# Patient Record
Sex: Female | Born: 1985 | Race: White | Hispanic: No | Marital: Married | Smoking: Former smoker
Health system: Southern US, Community
[De-identification: ages and names within clinical notes are randomized; demographics above are authoritative.]

## PROBLEM LIST (undated history)

## (undated) ENCOUNTER — Inpatient Hospital Stay (HOSPITAL_COMMUNITY): Payer: Self-pay

## (undated) VITALS — BP 145/92 | HR 83 | Temp 97.6°F | Resp 20

## (undated) DIAGNOSIS — I509 Heart failure, unspecified: Secondary | ICD-10-CM

## (undated) DIAGNOSIS — E669 Obesity, unspecified: Secondary | ICD-10-CM

## (undated) DIAGNOSIS — R011 Cardiac murmur, unspecified: Secondary | ICD-10-CM

## (undated) DIAGNOSIS — B009 Herpesviral infection, unspecified: Secondary | ICD-10-CM

## (undated) DIAGNOSIS — A6000 Herpesviral infection of urogenital system, unspecified: Secondary | ICD-10-CM

## (undated) DIAGNOSIS — IMO0002 Reserved for concepts with insufficient information to code with codable children: Secondary | ICD-10-CM

## (undated) DIAGNOSIS — R87619 Unspecified abnormal cytological findings in specimens from cervix uteri: Secondary | ICD-10-CM

## (undated) DIAGNOSIS — E119 Type 2 diabetes mellitus without complications: Secondary | ICD-10-CM

## (undated) DIAGNOSIS — I10 Essential (primary) hypertension: Secondary | ICD-10-CM

## (undated) DIAGNOSIS — O24419 Gestational diabetes mellitus in pregnancy, unspecified control: Secondary | ICD-10-CM

## (undated) DIAGNOSIS — Z9889 Other specified postprocedural states: Secondary | ICD-10-CM

## (undated) DIAGNOSIS — I341 Nonrheumatic mitral (valve) prolapse: Secondary | ICD-10-CM

## (undated) DIAGNOSIS — B999 Unspecified infectious disease: Secondary | ICD-10-CM

## (undated) DIAGNOSIS — J189 Pneumonia, unspecified organism: Secondary | ICD-10-CM

## (undated) DIAGNOSIS — E785 Hyperlipidemia, unspecified: Secondary | ICD-10-CM

## (undated) DIAGNOSIS — F419 Anxiety disorder, unspecified: Secondary | ICD-10-CM

## (undated) HISTORY — DX: Essential (primary) hypertension: I10

## (undated) HISTORY — DX: Obesity, unspecified: E66.9

## (undated) HISTORY — DX: Heart failure, unspecified: I50.9

## (undated) HISTORY — DX: Hyperlipidemia, unspecified: E78.5

## (undated) HISTORY — PX: CARDIAC SURGERY: SHX584

## (undated) HISTORY — PX: EXPLORATION POST OPERATIVE OPEN HEART: SHX5061

## (undated) HISTORY — DX: Other specified postprocedural states: Z98.890

## (undated) HISTORY — DX: Type 2 diabetes mellitus without complications: E11.9

---
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
## 2007-08-08 ENCOUNTER — Ambulatory Visit: Payer: Self-pay | Admitting: Internal Medicine

## 2008-06-04 ENCOUNTER — Encounter: Admission: RE | Admit: 2008-06-04 | Discharge: 2008-06-04 | Payer: Self-pay | Admitting: Internal Medicine

## 2010-02-10 LAB — HIV ANTIBODY (ROUTINE TESTING W REFLEX): HIV: NONREACTIVE

## 2010-02-10 LAB — ABO/RH: RH Type: POSITIVE

## 2010-02-21 ENCOUNTER — Emergency Department (HOSPITAL_COMMUNITY)
Admission: EM | Admit: 2010-02-21 | Discharge: 2010-02-21 | Payer: Self-pay | Source: Home / Self Care | Admitting: Emergency Medicine

## 2010-03-12 NOTE — L&D Delivery Note (Signed)
See progress notes

## 2010-05-20 ENCOUNTER — Inpatient Hospital Stay (HOSPITAL_COMMUNITY)
Admission: AD | Admit: 2010-05-20 | Discharge: 2010-05-20 | Disposition: A | Discharge status: Home / Self Care | Payer: Medicaid Other | Source: Ambulatory Visit | Attending: Obstetrics & Gynecology | Admitting: Obstetrics & Gynecology

## 2010-05-20 DIAGNOSIS — O99891 Other specified diseases and conditions complicating pregnancy: Secondary | ICD-10-CM | POA: Insufficient documentation

## 2010-05-20 DIAGNOSIS — O9989 Other specified diseases and conditions complicating pregnancy, childbirth and the puerperium: Secondary | ICD-10-CM

## 2010-05-20 DIAGNOSIS — R109 Unspecified abdominal pain: Secondary | ICD-10-CM | POA: Insufficient documentation

## 2010-05-20 LAB — CBC
MCH: 30 pg (ref 26.0–34.0)
MCHC: 34.3 g/dL (ref 30.0–36.0)
MCV: 87.5 fL (ref 78.0–100.0)
Platelets: 301 10*3/uL (ref 150–400)
RDW: 13.9 % (ref 11.5–15.5)
WBC: 12.8 10*3/uL — ABNORMAL HIGH (ref 4.0–10.5)

## 2010-05-20 LAB — URINALYSIS, ROUTINE W REFLEX MICROSCOPIC
Bilirubin Urine: NEGATIVE
Glucose, UA: 100 mg/dL — AB
Hgb urine dipstick: NEGATIVE
Ketones, ur: NEGATIVE mg/dL
Nitrite: NEGATIVE
Protein, ur: NEGATIVE mg/dL
Specific Gravity, Urine: 1.01 (ref 1.005–1.030)
Urobilinogen, UA: 0.2 mg/dL (ref 0.0–1.0)
pH: 7 (ref 5.0–8.0)

## 2010-05-23 LAB — DIFFERENTIAL
Basophils Absolute: 0.1 10*3/uL (ref 0.0–0.1)
Basophils Relative: 1 % (ref 0–1)
Eosinophils Relative: 4 % (ref 0–5)
Lymphocytes Relative: 22 % (ref 12–46)
Neutro Abs: 5.9 10*3/uL (ref 1.7–7.7)

## 2010-05-23 LAB — BASIC METABOLIC PANEL
BUN: 6 mg/dL (ref 6–23)
Creatinine, Ser: 0.46 mg/dL (ref 0.4–1.2)
GFR calc non Af Amer: >60 mL/min (ref >60)
Glucose, Bld: 101 mg/dL — ABNORMAL HIGH (ref 70–99)

## 2010-05-23 LAB — CBC
MCHC: 33.8 g/dL (ref 30.0–36.0)
Platelets: 334 10*3/uL (ref 150–400)
RDW: 13 % (ref 11.5–15.5)
WBC: 8.9 10*3/uL (ref 4.0–10.5)

## 2010-05-23 LAB — D-DIMER, QUANTITATIVE: D-Dimer, Quant: <0.22 ug/mL-FEU (ref 0.00–0.48)

## 2010-07-21 ENCOUNTER — Inpatient Hospital Stay (HOSPITAL_COMMUNITY)
Admission: AD | Admit: 2010-07-21 | Discharge: 2010-07-28 | DRG: 781 | Disposition: A | Discharge status: Home / Self Care | Payer: Medicaid Other | Source: Ambulatory Visit | Attending: Obstetrics and Gynecology | Admitting: Obstetrics and Gynecology

## 2010-07-21 DIAGNOSIS — O99891 Other specified diseases and conditions complicating pregnancy: Secondary | ICD-10-CM | POA: Diagnosis present

## 2010-07-21 DIAGNOSIS — R509 Fever, unspecified: Secondary | ICD-10-CM | POA: Diagnosis present

## 2010-07-21 DIAGNOSIS — R109 Unspecified abdominal pain: Secondary | ICD-10-CM | POA: Diagnosis present

## 2010-07-21 DIAGNOSIS — O98519 Other viral diseases complicating pregnancy, unspecified trimester: Principal | ICD-10-CM | POA: Diagnosis present

## 2010-07-21 DIAGNOSIS — D72829 Elevated white blood cell count, unspecified: Secondary | ICD-10-CM | POA: Diagnosis present

## 2010-07-21 DIAGNOSIS — A6000 Herpesviral infection of urogenital system, unspecified: Secondary | ICD-10-CM | POA: Diagnosis present

## 2010-07-22 ENCOUNTER — Inpatient Hospital Stay (HOSPITAL_COMMUNITY): Payer: Medicaid Other

## 2010-07-22 ENCOUNTER — Encounter (HOSPITAL_COMMUNITY): Payer: Self-pay | Admitting: Radiology

## 2010-07-22 DIAGNOSIS — R55 Syncope and collapse: Secondary | ICD-10-CM

## 2010-07-22 LAB — DIFFERENTIAL
Basophils Absolute: 0 10*3/uL (ref 0.0–0.1)
Lymphocytes Relative: 4 % — ABNORMAL LOW (ref 12–46)
Lymphs Abs: 0.8 10*3/uL (ref 0.7–4.0)
Monocytes Absolute: 3.5 10*3/uL — ABNORMAL HIGH (ref 0.1–1.0)
Neutro Abs: 16.3 10*3/uL — ABNORMAL HIGH (ref 1.7–7.7)

## 2010-07-22 LAB — URINE MICROSCOPIC-ADD ON

## 2010-07-22 LAB — COMPREHENSIVE METABOLIC PANEL
ALT: 9 U/L (ref 0–35)
BUN: 3 mg/dL — ABNORMAL LOW (ref 6–23)
CO2: 24 mEq/L (ref 19–32)
Calcium: 8.9 mg/dL (ref 8.4–10.5)
Creatinine, Ser: <0.47 mg/dL (ref 0.4–1.2)
Glucose, Bld: 148 mg/dL — ABNORMAL HIGH (ref 70–99)
Total Protein: 5.6 g/dL — ABNORMAL LOW (ref 6.0–8.3)

## 2010-07-22 LAB — CBC
HCT: 32.8 % — ABNORMAL LOW (ref 36.0–46.0)
HCT: 30 % — ABNORMAL LOW (ref 36.0–46.0)
Hemoglobin: 11.1 g/dL — ABNORMAL LOW (ref 12.0–15.0)
Hemoglobin: 11.3 g/dL — ABNORMAL LOW (ref 12.0–15.0)
MCH: 30.1 pg (ref 26.0–34.0)
MCH: 30.6 pg (ref 26.0–34.0)
MCHC: 33.8 g/dL (ref 30.0–36.0)
MCHC: 34.5 g/dL (ref 30.0–36.0)
MCV: 88.9 fL (ref 78.0–100.0)
MCV: 88 fL (ref 78.0–100.0)
Platelets: 259 10*3/uL (ref 150–400)
RBC: 3.41 MIL/uL — ABNORMAL LOW (ref 3.87–5.11)
RBC: 3.69 MIL/uL — ABNORMAL LOW (ref 3.87–5.11)
RDW: 14.2 % (ref 11.5–15.5)
RDW: 14.2 % (ref 11.5–15.5)
WBC: 21.3 10*3/uL — ABNORMAL HIGH (ref 4.0–10.5)

## 2010-07-22 LAB — URINALYSIS, ROUTINE W REFLEX MICROSCOPIC
Glucose, UA: 250 mg/dL — AB
Ketones, ur: NEGATIVE mg/dL
Nitrite: NEGATIVE
Protein, ur: NEGATIVE mg/dL

## 2010-07-22 MED ORDER — IOHEXOL 300 MG/ML  SOLN
100.0000 mL | Freq: Once | INTRAMUSCULAR | Status: AC | PRN
  Administered 2010-07-22: 100 mL via INTRAVENOUS

## 2010-07-23 LAB — CBC
HCT: 28.5 % — ABNORMAL LOW (ref 36.0–46.0)
MCH: 30.3 pg (ref 26.0–34.0)
MCV: 89.1 fL (ref 78.0–100.0)
RBC: 3.2 MIL/uL — ABNORMAL LOW (ref 3.87–5.11)
WBC: 16.8 10*3/uL — ABNORMAL HIGH (ref 4.0–10.5)

## 2010-07-23 LAB — DIFFERENTIAL
Basophils Relative: 0 % (ref 0–1)
Eosinophils Relative: 1 % (ref 0–5)
Lymphs Abs: 1.2 10*3/uL (ref 0.7–4.0)
Monocytes Relative: 17 % — ABNORMAL HIGH (ref 3–12)
Neutrophils Relative %: 75 % (ref 43–77)

## 2010-07-23 LAB — INFLUENZA PANEL BY PCR (TYPE A & B)
H1N1 flu by pcr: NOT DETECTED
Influenza A By PCR: NEGATIVE

## 2010-07-23 LAB — URINE CULTURE

## 2010-07-24 ENCOUNTER — Inpatient Hospital Stay (HOSPITAL_COMMUNITY): Payer: Medicaid Other

## 2010-07-24 LAB — CBC
HCT: 28.2 % — ABNORMAL LOW (ref 36.0–46.0)
Hemoglobin: 9.5 g/dL — ABNORMAL LOW (ref 12.0–15.0)
RBC: 3.17 MIL/uL — ABNORMAL LOW (ref 3.87–5.11)
WBC: 11.8 10*3/uL — ABNORMAL HIGH (ref 4.0–10.5)

## 2010-07-25 LAB — GLUCOSE, CAPILLARY: Glucose-Capillary: 84 mg/dL (ref 70–99)

## 2010-07-25 LAB — CBC
HCT: 27.2 % — ABNORMAL LOW (ref 36.0–46.0)
Hemoglobin: 9 g/dL — ABNORMAL LOW (ref 12.0–15.0)
MCH: 29.2 pg (ref 26.0–34.0)
MCHC: 33.1 g/dL (ref 30.0–36.0)
RBC: 3.08 MIL/uL — ABNORMAL LOW (ref 3.87–5.11)

## 2010-07-25 LAB — DIFFERENTIAL
Lymphocytes Relative: 10 % — ABNORMAL LOW (ref 12–46)
Lymphs Abs: 0.9 10*3/uL (ref 0.7–4.0)
Monocytes Absolute: 1.2 10*3/uL — ABNORMAL HIGH (ref 0.1–1.0)
Monocytes Relative: 13 % — ABNORMAL HIGH (ref 3–12)
Neutro Abs: 6.7 10*3/uL (ref 1.7–7.7)
Neutrophils Relative %: 75 % (ref 43–77)

## 2010-07-25 LAB — HEMOGLOBIN A1C: Mean Plasma Glucose: 108 mg/dL (ref <117)

## 2010-07-26 LAB — DIFFERENTIAL
Eosinophils Absolute: 0.1 10*3/uL (ref 0.0–0.7)
Eosinophils Relative: 1 % (ref 0–5)
Lymphs Abs: 1.1 10*3/uL (ref 0.7–4.0)
Monocytes Relative: 13 % — ABNORMAL HIGH (ref 3–12)

## 2010-07-26 LAB — URINE CULTURE
Culture  Setup Time: 201205160124
Special Requests: NEGATIVE

## 2010-07-26 LAB — CBC
MCH: 29.5 pg (ref 26.0–34.0)
MCHC: 33.5 g/dL (ref 30.0–36.0)
MCV: 88.3 fL (ref 78.0–100.0)
Platelets: 206 10*3/uL (ref 150–400)
RDW: 14.3 % (ref 11.5–15.5)

## 2010-07-26 LAB — COMPREHENSIVE METABOLIC PANEL
Albumin: 1.8 g/dL — ABNORMAL LOW (ref 3.5–5.2)
BUN: <3 mg/dL — ABNORMAL LOW (ref 6–23)
Creatinine, Ser: <0.47 mg/dL (ref 0.4–1.2)
Total Protein: 4.5 g/dL — ABNORMAL LOW (ref 6.0–8.3)

## 2010-07-26 LAB — URINALYSIS, MICROSCOPIC ONLY
Glucose, UA: NEGATIVE mg/dL
Nitrite: NEGATIVE
Protein, ur: NEGATIVE mg/dL
pH: 7 (ref 5.0–8.0)

## 2010-07-26 LAB — STREP B DNA PROBE

## 2010-07-27 LAB — BASIC METABOLIC PANEL
CO2: 22 mEq/L (ref 19–32)
Chloride: 106 mEq/L (ref 96–112)
Glucose, Bld: 97 mg/dL (ref 70–99)
Potassium: 4.1 mEq/L (ref 3.5–5.1)
Sodium: 137 mEq/L (ref 135–145)

## 2010-07-27 LAB — CMV IGM: CMV IgM: 0.06 (ref <0.90)

## 2010-07-27 LAB — CBC
HCT: 31.4 % — ABNORMAL LOW (ref 36.0–46.0)
Hemoglobin: 10.2 g/dL — ABNORMAL LOW (ref 12.0–15.0)
MCHC: 32.5 g/dL (ref 30.0–36.0)
RBC: 3.49 MIL/uL — ABNORMAL LOW (ref 3.87–5.11)
WBC: 7.6 10*3/uL (ref 4.0–10.5)

## 2010-07-27 LAB — CMV ANTIBODY, IGG (EIA): CMV Ab - IgG: 0.24 (ref <0.90)

## 2010-07-28 LAB — PARVOVIRUS B19 ANTIBODY, IGG AND IGM
Parovirus B19 IgG Abs: 4.1 Index — ABNORMAL HIGH (ref <0.9)
Parovirus B19 IgM Abs: <0.9 Index (ref <0.9)

## 2010-07-28 LAB — HERPES SIMPLEX VIRUS CULTURE

## 2010-07-28 LAB — HSV(HERPES SMPLX)ABS-I+II(IGG+IGM)-BLD
Herpes Simplex Vrs I + II Ab, IgG: 2.2 IV — ABNORMAL HIGH
Herpes Simplex Vrs I&II-IgM Ab (EIA): 0.93 INDEX

## 2010-07-28 NOTE — Assessment & Plan Note (Signed)
[facility]                            CARDIOLOGY OFFICE NOTE   [name], [name]                MRN:          [id number]  DATE:08/13/2007                            DOB:          06/10/1985    IDENTIFICATION:  [name] is a 25 year old woman who was previously  followed by Dr. Lorna Few.  She comes in for continued care.   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  She also has a history of small ASD.  Also a history  of reported mitral valve prolapse.  The patient was last seen in  December of 2005 and an echocardiogram was done that showed normal LV  function.  There is mild flattening of the interventricular septum  during diastole.  The left atrium appeared to be normal in size.  Mitral  valve had no evidence for prolapse.  Right atrium was normal.  Right  ventricle was noted to be mildly dilated at 29 mm (upper limits of 26 at  her age).  PA pressure, though, was normal at 22 mm.  RV function was  normal.  There was no mention of any residual ASD.   ALLERGIES:  None.   MEDICATIONS:  Include over-the-counter allergy medicine.   PAST MEDICAL HISTORY:  As noted above plus seasonal allergies.   SOCIAL HISTORY:  The patient as a Consulting civil engineer.  She smokes a pack every  couple weeks.  Drinks occasionally.   FAMILY HISTORY:  Not obtained   REVIEW OF SYSTEMS:  All systems are reviewed and negative to the above  problem except as noted above.   PHYSICAL EXAM:  On exam the patient is in no distress.  Blood pressure is 121/83, pulse is 90 and regular, weight not taken.  HEENT:  Normocephalic atraumatic, EOMI, PERL.  Mucous membranes are  moist.  NECK:  JVP is normal.  No bruits.  No thyromegaly.  Lungs are clear without rales.  Cardiac exam, S1-S2 no S3-S4, murmurs or clicks noted.  No RV heave.  Abdomen is supple, no hepatomegaly.  Normal bowel sounds.  No masses.  EXTREMITIES:  Good  distal pulses throughout.  No lower extremity edema.  Note, tattoos noted.   A 12-lead EKG shows normal sinus rhythm 90 beats per minute.  Nonspecific ST-T wave changes.   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  Echocardiogram without residual defects 4 years ago.  Examination unremarkable today.  She also had a question of history of  mitral valve prolapse.  Again not seen on echo.  I do not hear any  evidence for a today.  I do not sense any evidence for any residual  shunt.  I do not have the records from Dr. Lorna Few.  Again the  echo does not report this.   Overall I think she has done well.  She is now fully grown.  I do not  suspect that she should have any further problems and I do not think  that she needs to be seen on a regular basis, only as needed  for now.   I have counseled her about health care maintenance, counseled her about  smoking.  I think she should stop.  I have counseled her on exercise to  continue to remain active.  She should have her lipids checked in the  next few years and then every 3 to 5 years after if they are normal.  If  not normal, sooner.   RECOMMENDATIONS:  In review of the recommendations by the American Heart  Association, the patient does not have any of the criteria for continued  antibiotic prophylaxis.  Again, I will review her surgical record.  I  have counseled her on piercing, though I do not think further piercing,  she has her ears pierced, would be good for her.  Again, she should be  careful with tattoos for sterility during placement.   I will be available as needed.     Pricilla Riffle, MD, [facility]  Electronically Signed    PVR/MedQ  DD: 08/13/2007  DT: 08/13/2007  Job #: [id number]

## 2010-07-29 NOTE — Discharge Summary (Signed)
  NAMEPAULLETTE, [name]           ACCOUNT NO.:  [id number]  MEDICAL RECORD NO.:  [id number]           PATIENT TYPE:  I  LOCATION:  9155                          FACILITY:  [facility]  PHYSICIAN:  Gerrit Friends. Aldona Bar, M.D.   DATE OF BIRTH:  1985-08-23  DATE OF ADMISSION:  07/21/2010 DATE OF DISCHARGE:  07/28/2010                              DISCHARGE SUMMARY   DISCHARGE DIAGNOSES: 1. A 33-week intrauterine pregnancy, undelivered. 2. 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 3. Fever, abdominal pain, leukocytosis, secondary to primary herpes     simplex infection.  SUMMARY:  This patient, a 25 year old primigravida was admitted by Dr. Dareen Piano on May 11 at 31-32 weeks' gestation with a fever, lower abdominal pain, leukocytosis, and underwent a very extensive workup which included a general surgical consult to rule out appendicitis.  An abdominal and pelvic CT scan, renal ultrasound and cultures for Chlamydia, gonorrhea, group B strep all of which were negative.  Blood cultures which are still pending, but at this point being read as negative.  A monospot test which was negative.  Hemoglobin A1c which was normal.  Cytomegalovirus which was negative.  Parvovirus testing which is pending.  Herpes cultures which are still pending.  The diagnosis was in question until approximately 2 days ago, went on rounds, she broke out with multiple ulcerated areas which was very tender in the vulvar area - very consistent with a primary herpes infection considering her symptoms.  She was begun on Valtrex and began having fairly significant improvement both in her fever and her general state.  All during this episode, fetal monitoring was within normal limits.  After being started on Valtrex and having a hypokalemia corrected with potassium, she began improving and at this point in time, is feeling much better and essentially has been afebrile for the past 24+ hours.  She currently  is continuing her Valtrex and using lidocaine 5% gel for comfort and notices of tremendous improvement.  Although, her herpes cultures and her parvovirus tests and her blood cultures are still not completely available.  It is felt that her clinical improvement justifies discharging her to home with followup in the office in approximately 4-5 days' time or as needed.  She was instructed on a lidocaine gel use.  She was given a prescription for Valtrex 500 mg to use twice daily for another 5 days and thereafter daily for the duration of pregnancy considering her culture will probably return as positive.  She will continue on her prenatal vitamins.  She was on Rocephin, which was discontinued on the evening of 17th.  Her most recent hemoglobin was done yesterday, May 17 and was 10.2 with a white count of 7600 and a platelet count of 228,000.  She was given all appropriate structures at the time of discharge and as mentioned, she will follow up in the office in approximately 4-5 days' time.  CONDITION ON DISCHARGE:  Improved.  Gerrit Friends. Aldona Bar, M.D.     RMW/MEDQ  D:  07/28/2010  T:  07/28/2010  Job:  [id number]  Electronically Signed by Annamaria Helling M.D. on 07/29/2010 03:05:06 PM

## 2010-07-30 LAB — CULTURE, BLOOD (ROUTINE X 2)
Culture  Setup Time: 201205141017
Culture  Setup Time: 201205141017

## 2010-08-03 NOTE — Consult Note (Signed)
NAME:  [name], [name]           ACCOUNT NO.:  [id number]  MEDICAL RECORD NO.:  [id number]           PATIENT TYPE:  LOCATION:                                 FACILITY:  PHYSICIAN:  Sandria Bales. Ezzard Standing, M.D.  DATE OF BIRTH:  23-Feb-1986  DATE OF CONSULTATION: 22 Jul 2010                                CONSULTATION   REASON OR CONSULTATION:  Possible acute abdomen.  REFERRING PHYSICIAN:  Malva Limes, MD  HISTORY OF PRESENT ILLNESS:  [name] is a 25 year old white female who is a patient of Dr. Malva Limes who is admitted with a 31-week pregnancy.  This is her first pregnancy.  She has had a fairly uneventful pregnancy until maybe Wednesday of this week which would be Jul 19, 2010, when she started feeling poorly, complained of some vague discomfort.  By Friday, she had some chills, nausea, and vomiting and was admitted to the [facility] unit.  She denies any history of peptic ulcer disease, liver disease, pancreas disease, or colon disease.  She has had no prior abdominal surgery.  She did just completed a CT scan of her abdomen.  I discussed this by phone with Dr. Christiana Pellant.  The appendix can be seen and is normal. There is no acute inflammatory mass.  She does have a stone of the inferior pole of the left kidney.  PAST MEDICAL HISTORY:  As she is right now.  I think this she is on neonatal vitamins, but no other medicines.  REVIEW OF SYSTEMS:   NEUROLOGIC:  She has never had any seizure or loss of consciousness.  She just had some vague headaches when this first started, which raised the question of some flu-like symptoms and she has been tested for this and isolation because of rule out flu.   CARDIAC: At 9 months, she had an open repair of a cor triatriatum and apparently had a small ASD.  She was originally followed by Dr. Lorna Few, then followed by Dr. Dietrich Pates.  The last visit with Dr. Tenny Craw was June 2009 and she was doing well at that time.  She  apparently had a repeat echo yesterday.  Even though I cannot find a report, though discussion is this was a normal echo both no cardiac source for fever.   PULMONARY: She smoked about a pack of cigarettes up until the beginning of her pregnancy.  She says she quit since the beginning of the pregnancy.  She has no cough or pulmonary symptoms.   GASTROINTESTINAL:  See history of present illness.   UROLOGIC:  She has never noted a kidney stones or kidney infections before.   MUSCULOSKELETAL:  She broke her right arm and second and third ribs, but has no chronic musculoskeletal problems.  PERSONAL HISTORY:  I guess it is her husband who is in the room with her.  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  PHYSICAL EXAMINATION:  VITAL SIGNS:  At the current time, her temperature is 99, pulse 105, blood pressure 114/62.  Her temperature earlier today was 101.4. HEENT:  Unremarkable. NECK:  Supple.  I feel  no mass.  No thyromegaly.  She has no cervical or supraclavicular adenopathy. LUNGS:  Clear to auscultation with symmetric breath sounds.  She has a well-healed median sternotomy scar. HEART:  Regular rate and rhythm. BREAST:  I did not examine her breast. ABDOMEN:  A pregnant woman.  She has a tattoo of her lower abdomen, but she has no guarding, no rebound.  She has decreased but present bowel sounds. RECTAL:  I did not do a rectal exam on her. EXTREMITIES:  She has good strength in upper and lower extremities. NEUROLOGIC:  Grossly intact.  LABORATORY DATA:  Data have show again a white blood count is 21,300, hemoglobin 10.4, hematocrit 30, and platelet count 218,000.  Her differential was fairly normal with 79% neutrophils.   Her complete metabolic panel shows a sodium of 134, potassium 3.2, chloride of 99, CO2 of 24, glucose of 148, BUN 3, creatinine less than 0.47.  Her albumin is 2.4.  Her alk phos is mildly elevated at 128.  Her other  liver enzymes are normal.  Her urinalysis did show 11-20 white blood cells.  IMPRESSION: 1. Acute febrile episode with nausea and vomiting.  Etiology unclear,     though there is no obvious acute surgical cause of this. 2. Left inferior pole kidney stone which seems unrelated to the acute     infection.  Assymptomatic. 3. Leukocytosis, on Rocephin 4. History of cardiac surgery at 9 months for a triatriatum, but     stable echo yesterday 5. She has smoked until the beginning of her current pregnancy. 6. She is 31 weeks' pregnant with what appears to be a healthy infant     by fetal heart sounds.  At this current time, the patient is on IV fluids, on IV antibiotics, being observed and agree with this management and will follow with Dr. Dareen Piano.   Sandria Bales. Ezzard Standing, M.D., FACS   DHN/MEDQ  D:  07/22/2010  T:  07/23/2010  Job:  [id number]  Electronically Signed by Ovidio Kin M.D. on 08/03/2010 09:29:49 AM

## 2010-09-11 ENCOUNTER — Ambulatory Visit (HOSPITAL_COMMUNITY)
Admission: RE | Admit: 2010-09-11 | Discharge: 2010-09-11 | Disposition: A | Discharge status: Home / Self Care | Payer: Medicaid Other | Source: Ambulatory Visit | Attending: Obstetrics and Gynecology | Admitting: Obstetrics and Gynecology

## 2010-09-11 ENCOUNTER — Other Ambulatory Visit (HOSPITAL_COMMUNITY): Payer: Self-pay | Admitting: Obstetrics and Gynecology

## 2010-09-11 DIAGNOSIS — O4190X0 Disorder of amniotic fluid and membranes, unspecified, unspecified trimester, not applicable or unspecified: Secondary | ICD-10-CM

## 2010-09-11 DIAGNOSIS — R58 Hemorrhage, not elsewhere classified: Secondary | ICD-10-CM

## 2010-09-11 DIAGNOSIS — O4690 Antepartum hemorrhage, unspecified, unspecified trimester: Secondary | ICD-10-CM | POA: Insufficient documentation

## 2010-09-11 DIAGNOSIS — Z3689 Encounter for other specified antenatal screening: Secondary | ICD-10-CM | POA: Insufficient documentation

## 2010-09-15 ENCOUNTER — Inpatient Hospital Stay (HOSPITAL_COMMUNITY)
Admission: AD | Admit: 2010-09-15 | Discharge: 2010-09-15 | Disposition: A | Discharge status: Home / Self Care | Payer: Medicaid Other | Source: Ambulatory Visit | Attending: Obstetrics and Gynecology | Admitting: Obstetrics and Gynecology

## 2010-09-15 DIAGNOSIS — O479 False labor, unspecified: Secondary | ICD-10-CM | POA: Insufficient documentation

## 2010-09-15 DIAGNOSIS — K2970 Gastritis, unspecified, without bleeding: Secondary | ICD-10-CM

## 2010-09-15 DIAGNOSIS — K2990 Gastroduodenitis, unspecified, without bleeding: Secondary | ICD-10-CM

## 2010-09-15 DIAGNOSIS — O9989 Other specified diseases and conditions complicating pregnancy, childbirth and the puerperium: Secondary | ICD-10-CM

## 2010-09-15 DIAGNOSIS — O99891 Other specified diseases and conditions complicating pregnancy: Secondary | ICD-10-CM | POA: Insufficient documentation

## 2010-09-15 LAB — URINALYSIS, ROUTINE W REFLEX MICROSCOPIC
Glucose, UA: NEGATIVE mg/dL
Hgb urine dipstick: NEGATIVE
Specific Gravity, Urine: 1.01 (ref 1.005–1.030)

## 2010-09-18 LAB — URINE CULTURE: Colony Count: 55000

## 2010-09-21 ENCOUNTER — Encounter (HOSPITAL_COMMUNITY): Payer: Self-pay | Admitting: *Deleted

## 2010-09-21 ENCOUNTER — Inpatient Hospital Stay (HOSPITAL_COMMUNITY)
Admission: AD | Admit: 2010-09-21 | Discharge: 2010-09-22 | Disposition: A | Discharge status: Home / Self Care | Payer: Medicaid Other | Source: Ambulatory Visit | Attending: Obstetrics and Gynecology | Admitting: Obstetrics and Gynecology

## 2010-09-21 DIAGNOSIS — O479 False labor, unspecified: Secondary | ICD-10-CM | POA: Insufficient documentation

## 2010-09-21 NOTE — Progress Notes (Signed)
Pt reports she was seen in the office today. SVE  3 cm. Contractions are now q 5-7 minutes. States she has been bleeding like a period x 8 hours (used 3 pads )

## 2010-09-22 ENCOUNTER — Inpatient Hospital Stay (HOSPITAL_COMMUNITY)
Admission: AD | Admit: 2010-09-22 | Discharge: 2010-09-24 | DRG: 775 | Disposition: A | Discharge status: Home / Self Care | Payer: Medicaid Other | Source: Ambulatory Visit | Attending: Obstetrics and Gynecology | Admitting: Obstetrics and Gynecology

## 2010-09-22 ENCOUNTER — Encounter (HOSPITAL_COMMUNITY): Payer: Self-pay | Admitting: *Deleted

## 2010-09-22 ENCOUNTER — Inpatient Hospital Stay (HOSPITAL_COMMUNITY): Payer: Medicaid Other | Admitting: Anesthesiology

## 2010-09-22 ENCOUNTER — Encounter (HOSPITAL_COMMUNITY): Payer: Self-pay | Admitting: Anesthesiology

## 2010-09-22 ENCOUNTER — Encounter (HOSPITAL_COMMUNITY): Payer: Self-pay

## 2010-09-22 DIAGNOSIS — Z789 Other specified health status: Secondary | ICD-10-CM | POA: Insufficient documentation

## 2010-09-22 HISTORY — DX: Herpesviral infection of urogenital system, unspecified: A60.00

## 2010-09-22 HISTORY — DX: Herpesviral infection, unspecified: B00.9

## 2010-09-22 LAB — COMPREHENSIVE METABOLIC PANEL
Albumin: 3 g/dL — ABNORMAL LOW (ref 3.5–5.2)
Alkaline Phosphatase: 232 U/L — ABNORMAL HIGH (ref 39–117)
BUN: 9 mg/dL (ref 6–23)
CO2: 18 mEq/L — ABNORMAL LOW (ref 19–32)
Chloride: 98 mEq/L (ref 96–112)
Glucose, Bld: 117 mg/dL — ABNORMAL HIGH (ref 70–99)
Potassium: 3.7 mEq/L (ref 3.5–5.1)
Total Bilirubin: 0.6 mg/dL (ref 0.3–1.2)

## 2010-09-22 LAB — CBC
Hemoglobin: 13.4 g/dL (ref 12.0–15.0)
MCH: 30.9 pg (ref 26.0–34.0)
MCHC: 34.7 g/dL (ref 30.0–36.0)
MCV: 89.1 fL (ref 78.0–100.0)

## 2010-09-22 LAB — RPR: RPR Ser Ql: NONREACTIVE

## 2010-09-22 LAB — LACTATE DEHYDROGENASE: LDH: 215 U/L (ref 94–250)

## 2010-09-22 MED ORDER — LACTATED RINGERS IV SOLN
INTRAVENOUS | Status: DC
  Administered 2010-09-22 (×2): via INTRAVENOUS

## 2010-09-22 MED ORDER — ZOLPIDEM TARTRATE 10 MG PO TABS: 10.0000 mg | ORAL_TABLET | Freq: Every evening | ORAL | Status: DC | PRN

## 2010-09-22 MED ORDER — CITRIC ACID-SODIUM CITRATE 334-500 MG/5ML PO SOLN: 30.0000 mL | ORAL | Status: DC | PRN

## 2010-09-22 MED ORDER — AMPICILLIN SODIUM 2 G IJ SOLR
2.0000 g | Freq: Four times a day (QID) | INTRAMUSCULAR | Status: DC
  Filled 2010-09-22 (×3): qty 2000

## 2010-09-22 MED ORDER — LIDOCAINE HCL (PF) 1 % IJ SOLN
30.0000 mL | Freq: Once | INTRAMUSCULAR | Status: AC | PRN
  Filled 2010-09-22: qty 30

## 2010-09-22 MED ORDER — ONDANSETRON HCL 4 MG/2ML IJ SOLN
4.0000 mg | Freq: Four times a day (QID) | INTRAMUSCULAR | Status: DC | PRN
  Administered 2010-09-22: 4 mg via INTRAVENOUS
  Filled 2010-09-22: qty 2

## 2010-09-22 MED ORDER — DIPHENHYDRAMINE HCL 50 MG/ML IJ SOLN: 12.5000 mg | INTRAMUSCULAR | Status: DC | PRN

## 2010-09-22 MED ORDER — OXYCODONE-ACETAMINOPHEN 5-325 MG PO TABS
2.0000 | ORAL_TABLET | ORAL | Status: DC | PRN
  Filled 2010-09-22: qty 1

## 2010-09-22 MED ORDER — ACETAMINOPHEN 325 MG PO TABS: 650.0000 mg | ORAL_TABLET | ORAL | Status: DC | PRN

## 2010-09-22 MED ORDER — OXYTOCIN 20 UNITS IN LACTATED RINGERS INFUSION - SIMPLE: 1.0000 m[IU]/min | INTRAVENOUS | Status: DC

## 2010-09-22 MED ORDER — PHENYLEPHRINE 40 MCG/ML (10ML) SYRINGE FOR IV PUSH (FOR BLOOD PRESSURE SUPPORT)
80.0000 ug | PREFILLED_SYRINGE | INTRAVENOUS | Status: DC | PRN
  Filled 2010-09-22: qty 5

## 2010-09-22 MED ORDER — TERBUTALINE SULFATE 1 MG/ML IJ SOLN: 0.2500 mg | Freq: Once | INTRAMUSCULAR | Status: AC | PRN

## 2010-09-22 MED ORDER — LACTATED RINGERS IV SOLN
500.0000 mL | INTRAVENOUS | Status: AC | PRN
  Administered 2010-09-23: 1000 mL via INTRAVENOUS

## 2010-09-22 MED ORDER — OXYTOCIN 20 UNITS IN LACTATED RINGERS INFUSION - SIMPLE
INTRAVENOUS | Status: AC
  Filled 2010-09-22: qty 1000

## 2010-09-22 MED ORDER — EPHEDRINE 5 MG/ML INJ
10.0000 mg | INTRAVENOUS | Status: DC | PRN
  Filled 2010-09-22 (×2): qty 4

## 2010-09-22 MED ORDER — EPHEDRINE 5 MG/ML INJ
10.0000 mg | INTRAVENOUS | Status: DC | PRN
  Filled 2010-09-22: qty 4

## 2010-09-22 MED ORDER — SODIUM CHLORIDE 0.9 % IV SOLN
2.0000 g | Freq: Four times a day (QID) | INTRAVENOUS | Status: DC
  Administered 2010-09-22: 2 g via INTRAVENOUS
  Filled 2010-09-22 (×4): qty 2000

## 2010-09-22 MED ORDER — FENTANYL 2.5 MCG/ML BUPIVACAINE 1/10 % EPIDURAL INFUSION (WH - ANES)
2.0000 mL/h | INTRAMUSCULAR | Status: DC
  Administered 2010-09-22 (×4): 12 mL/h via EPIDURAL
  Filled 2010-09-22 (×4): qty 60

## 2010-09-22 MED ORDER — LACTATED RINGERS IV SOLN
500.0000 mL | Freq: Once | INTRAVENOUS | Status: AC
  Administered 2010-09-22: 1000 mL via INTRAVENOUS

## 2010-09-22 MED ORDER — IBUPROFEN 600 MG PO TABS
600.0000 mg | ORAL_TABLET | Freq: Four times a day (QID) | ORAL | Status: DC | PRN
  Administered 2010-09-23: 600 mg via ORAL
  Filled 2010-09-22: qty 1

## 2010-09-22 MED ORDER — LIDOCAINE HCL 1.5 % IJ SOLN
INTRAMUSCULAR | Status: DC | PRN
  Administered 2010-09-22: 4 mL
  Administered 2010-09-22: 3 mL via INTRADERMAL

## 2010-09-22 MED ORDER — FLEET ENEMA 7-19 GM/118ML RE ENEM: 1.0000 | ENEMA | RECTAL | Status: DC | PRN

## 2010-09-22 NOTE — Progress Notes (Signed)
Ctx's q54minutes since early this am, bloody show, denies lof, +FM.

## 2010-09-22 NOTE — H&P (Addendum)
Patient comes in c/o labor.  Otherwise has good fetal movement and no bleeding. No sx pre-eclampsia.  Past Medical History  Diagnosis Date  . Herpes genitalia   . No pertinent past medical history   . HSV (herpes simplex virus) infection     last outbreak in may    Past Surgical History  Procedure Date  . Cardiac surgery     congenital heart defect repair at 9 mos    OB History    Grav Para Term Preterm Abortions TAB SAB Ect Mult Living   1  0 0 0 0 0 0 0 0     # Outc Date GA Lbr Len/2nd Wgt Sex Del Anes PTL Lv   1 CUR               History   Social History  . Marital Status: Married    Spouse Name: N/A    Number of Children: N/A  . Years of Education: N/A   Occupational History  . Not on file.   Social History Main Topics  . Smoking status: Not on file  . Smokeless tobacco: Not on file  . Alcohol Use:   . Drug Use:   . Sexually Active: Yes   Other Topics Concern  . Not on file   Social History Narrative  . No narrative on file   Review of patient's allergies indicates no known allergies.   Prenatal Course:  Fetal echo done because of maternal history of cardiac malformation- normal echo.  Filed Vitals:   09/22/10 1121  BP: 142/72  Pulse: 89  Temp:   Resp:      Lungs/Cor:  NAD Abdomen:  soft, gravid Ex:  no cords, erythema SVE:  6/80/-2 FHTs:  140s, good STV, NST R Toco:  q3 SSE negative for lesions.  AROM clear.   A/P   Admit for labor.  BPs elevated.  GBS neg.  PIH labs.    Myrtle Haller A

## 2010-09-22 NOTE — Anesthesia Procedure Notes (Addendum)
Epidural Patient location during procedure: OB Start time: 09/22/2010 11:09 AM  Preanesthetic Checklist Completed: patient identified, site marked, surgical consent, pre-op evaluation, timeout performed, IV checked, risks and benefits discussed and monitors and equipment checked  Epidural Patient position: sitting Prep: site prepped and draped and DuraPrep Patient monitoring: continuous pulse ox and blood pressure Approach: midline Injection technique: LOR air  Needle:  Needle type: Tuohy  Needle gauge: 17 G Needle length: 9 cm Needle insertion depth: 4 cm Catheter type: closed end flexible Catheter size: 19 Gauge Catheter at skin depth: 10 and 9 cm Test dose: negative and 1.5% lidocaine  Assessment Events: blood not aspirated, injection not painful, no injection resistance, negative IV test and no paresthesia  Epidural Patient location during procedure: OB Start time: 09/22/2010 11:10 AM  Staffing Anesthesiologist: Levar Fayson A. Performed by: anesthesiologist   Preanesthetic Checklist Completed: patient identified, site marked, surgical consent, pre-op evaluation, timeout performed, IV checked, risks and benefits discussed and monitors and equipment checked  Epidural Patient position: sitting Prep: site prepped and draped and DuraPrep Patient monitoring: continuous pulse ox and blood pressure Approach: midline Injection technique: LOR air  Needle:  Needle type: Tuohy  Needle gauge: 17 G Needle length: 9 cm Needle insertion depth: 4 cm Catheter type: closed end flexible Catheter size: 19 Gauge Catheter at skin depth: 9 cm Test dose: negative and 1.5% lidocaine  Additional Notes Patient is more comfortable after epidural dosed. Please see RN's note for documentation of vital signs and FHR which are stable.

## 2010-09-22 NOTE — Anesthesia Preprocedure Evaluation (Signed)
Anesthesia Evaluation  Name, MR# and DOB Patient awake and Patient confused  General Assessment Comment  Reviewed: Allergy & Precautions, H&P  and Patient's Chart, lab work & pertinent test results  Airway Mallampati: III TM Distance: >3 FB Neck ROM: full    Dental No notable dental hx (+) Teeth Intact   Pulmonaryneg pulmonary ROS      pulmonary exam normal   Cardiovascular regular Normal   Neuro/Psych  GI/Hepatic/Renal negative GI ROS, negative Liver ROS, and negative Renal ROS (+)       Endo/Other  Negative Endocrine ROS (+)   Abdominal   Musculoskeletal  Hematology negative hematology ROS (+)   Peds  Reproductive/Obstetrics negative OB ROS   Anesthesia Other Findings             Anesthesia Physical Anesthesia Plan  ASA: II  Anesthesia Plan: Epidural   Post-op Pain Management:    Induction:   Airway Management Planned:   Additional Equipment:   Intra-op Plan:   Post-operative Plan:   Informed Consent: I have reviewed the patients History and Physical, chart, labs and discussed the procedure including the risks, benefits and alternatives for the proposed anesthesia with the patient or authorized representative who has indicated his/her understanding and acceptance.     Plan Discussed with: Anesthesiologist  Anesthesia Plan Comments:         Anesthesia Quick Evaluation

## 2010-09-22 NOTE — Progress Notes (Signed)
Pt in for labor eval- states she has been having ucs all day- worse since 1800, have been about 7-10 minutes apart.  Reports small amount of bleeding throughout day, increased around 1530.  + FM.  Denies any leaking of fluid.  3 cm earlier in office.

## 2010-09-22 NOTE — Progress Notes (Signed)
Here last night, contractions are closer and stronger. Intact, small amt of bloody mucous.

## 2010-09-23 LAB — CBC
HCT: 33.5 % — ABNORMAL LOW (ref 36.0–46.0)
Hemoglobin: 11.4 g/dL — ABNORMAL LOW (ref 12.0–15.0)
MCH: 30.7 pg (ref 26.0–34.0)
MCV: 90.3 fL (ref 78.0–100.0)
Platelets: 236 10*3/uL (ref 150–400)
RBC: 3.71 MIL/uL — ABNORMAL LOW (ref 3.87–5.11)
WBC: 26.4 10*3/uL — ABNORMAL HIGH (ref 4.0–10.5)

## 2010-09-23 MED ORDER — MEASLES, MUMPS & RUBELLA VAC ~~LOC~~ INJ
0.5000 mL | INJECTION | Freq: Once | SUBCUTANEOUS | Status: AC
  Administered 2010-09-24: 0.5 mL via SUBCUTANEOUS
  Filled 2010-09-23 (×2): qty 0.5

## 2010-09-23 MED ORDER — FERROUS SULFATE 325 (65 FE) MG PO TABS
325.0000 mg | ORAL_TABLET | Freq: Two times a day (BID) | ORAL | Status: DC
  Administered 2010-09-23 – 2010-09-24 (×3): 325 mg via ORAL
  Filled 2010-09-23 (×3): qty 1

## 2010-09-23 MED ORDER — PRENATAL PLUS 27-1 MG PO TABS
1.0000 | ORAL_TABLET | Freq: Every day | ORAL | Status: DC
  Administered 2010-09-23 – 2010-09-24 (×2): 1 via ORAL
  Filled 2010-09-23 (×3): qty 1

## 2010-09-23 MED ORDER — MAGNESIUM HYDROXIDE 400 MG/5ML PO SUSP: 30.0000 mL | ORAL | Status: DC | PRN

## 2010-09-23 MED ORDER — RHO D IMMUNE GLOBULIN 1500 UNIT/2ML IJ SOLN: 300.0000 ug | Freq: Once | INTRAMUSCULAR | Status: DC

## 2010-09-23 MED ORDER — BENZOCAINE-MENTHOL 20-0.5 % EX AERO: 1.0000 "application " | INHALATION_SPRAY | CUTANEOUS | Status: DC | PRN

## 2010-09-23 MED ORDER — ONDANSETRON HCL 4 MG/2ML IJ SOLN: 4.0000 mg | INTRAMUSCULAR | Status: DC | PRN

## 2010-09-23 MED ORDER — BENZOCAINE-MENTHOL 20-0.5 % EX AERO
INHALATION_SPRAY | CUTANEOUS | Status: AC
  Filled 2010-09-23: qty 56

## 2010-09-23 MED ORDER — ONDANSETRON HCL 4 MG PO TABS: 4.0000 mg | ORAL_TABLET | ORAL | Status: DC | PRN

## 2010-09-23 MED ORDER — TETANUS-DIPHTH-ACELL PERTUSSIS 5-2.5-18.5 LF-MCG/0.5 IM SUSP
0.5000 mL | Freq: Once | INTRAMUSCULAR | Status: AC
  Administered 2010-09-24: 0.5 mL via INTRAMUSCULAR
  Filled 2010-09-23: qty 0.5

## 2010-09-23 MED ORDER — LANOLIN HYDROUS EX OINT: TOPICAL_OINTMENT | CUTANEOUS | Status: DC | PRN

## 2010-09-23 MED ORDER — SENNOSIDES-DOCUSATE SODIUM 8.6-50 MG PO TABS
1.0000 | ORAL_TABLET | Freq: Every day | ORAL | Status: DC
  Administered 2010-09-23: 2 via ORAL

## 2010-09-23 MED ORDER — DIPHENHYDRAMINE HCL 25 MG PO CAPS: 25.0000 mg | ORAL_CAPSULE | Freq: Four times a day (QID) | ORAL | Status: DC | PRN

## 2010-09-23 MED ORDER — SIMETHICONE 80 MG PO CHEW: 80.0000 mg | CHEWABLE_TABLET | ORAL | Status: DC | PRN

## 2010-09-23 MED ORDER — NITROFURANTOIN MACROCRYSTAL 50 MG PO CAPS
50.0000 mg | ORAL_CAPSULE | Freq: Every day | ORAL | Status: DC
  Administered 2010-09-23: 50 mg via ORAL
  Filled 2010-09-23 (×2): qty 1

## 2010-09-23 MED ORDER — OXYCODONE-ACETAMINOPHEN 5-325 MG PO TABS
1.0000 | ORAL_TABLET | ORAL | Status: DC | PRN
  Administered 2010-09-23: 1 via ORAL
  Administered 2010-09-23 – 2010-09-24 (×3): 2 via ORAL
  Filled 2010-09-23 (×3): qty 2

## 2010-09-23 MED ORDER — ZOLPIDEM TARTRATE 5 MG PO TABS: 5.0000 mg | ORAL_TABLET | Freq: Every evening | ORAL | Status: DC | PRN

## 2010-09-23 MED ORDER — IBUPROFEN 800 MG PO TABS
800.0000 mg | ORAL_TABLET | Freq: Three times a day (TID) | ORAL | Status: DC
  Administered 2010-09-23 – 2010-09-24 (×5): 800 mg via ORAL
  Filled 2010-09-23 (×5): qty 1

## 2010-09-23 MED ORDER — SODIUM CHLORIDE 0.9 % IV SOLN: 250.0000 mL | INTRAVENOUS | Status: DC

## 2010-09-23 MED ORDER — SODIUM CHLORIDE 0.9 % IJ SOLN: 3.0000 mL | Freq: Two times a day (BID) | INTRAMUSCULAR | Status: DC

## 2010-09-23 MED ORDER — SODIUM CHLORIDE 0.9 % IJ SOLN: 3.0000 mL | INTRAMUSCULAR | Status: DC | PRN

## 2010-09-23 MED ORDER — VALACYCLOVIR HCL 500 MG PO TABS
500.0000 mg | ORAL_TABLET | Freq: Every day | ORAL | Status: DC
  Administered 2010-09-23 – 2010-09-24 (×2): 500 mg via ORAL
  Filled 2010-09-23 (×3): qty 1

## 2010-09-23 MED ORDER — METHYLERGONOVINE MALEATE 0.2 MG PO TABS: 0.2000 mg | ORAL_TABLET | ORAL | Status: DC | PRN

## 2010-09-23 MED ORDER — WITCH HAZEL-GLYCERIN EX PADS: MEDICATED_PAD | CUTANEOUS | Status: DC | PRN

## 2010-09-23 NOTE — Progress Notes (Signed)
  Patient is eating, ambulating, voiding.  Pain control is good.  Filed Vitals:   09/23/10 0345 09/23/10 0415 09/23/10 0447 09/23/10 0900  BP: 113/80  116/71 118/72  Pulse: 96  84 108  Temp:   97.5 F (36.4 C) 97.9 F (36.6 C)  TempSrc:      Resp: 20  20 20   SpO2: 96% 96% 95%     Fundus firm Perineum without swelling.  Lab Results  Component Value Date   WBC 26.4* 09/23/2010   HGB 11.4* 09/23/2010   HCT 33.5* 09/23/2010   MCV 90.3 09/23/2010   PLT 236 09/23/2010       A/P  Routine care.  Expect d/c per plan.  Circumcision to be done in office.  Needs MMR before d/c.  Iverna Hammac A

## 2010-09-23 NOTE — Progress Notes (Signed)
Patient was C/C/+1 and pushed for 1 hour with epidural to +3.   Some subtle lates developed and the patient was consented for VE assist.  VEVD in 3 pulled contractions and with only one popoff.  Female infant, Apgars 8/9, weight 8#7.   The patient had one 3rd degree laceration repaired with  2-0 vicryl rapide.    Fundus was firm. EBL was expected. Placenta was delivered intact. Vagina was clear.  Baby was vigorous to bedside. Raylea Adcox A

## 2010-09-24 NOTE — Discharge Summary (Signed)
Obstetric Discharge Summary Reason for Admission: onset of labor Prenatal Procedures: none Intrapartum Procedures: vacuum Postpartum Procedures: none Complications-Operative and Postpartum: 3rd degree perineal laceration  Hemoglobin  Date Value Range Status  09/23/2010 11.4* 12.0-15.0 (g/dL) Final     HCT  Date Value Range Status  09/23/2010 33.5* 36.0-46.0 (%) Final    Discharge Diagnoses: Term Pregnancy-delivered  Discharge Information: Date: 09/24/2010 Activity: pelvic rest Diet: routine Medications: Ibuprophen Condition: stable Instructions: refer to practice specific booklet Discharge to: home   Newborn Data: Live born  Information for the patient's newborn:  [name], [name] [[id number]]  female ; APGAR , ; weight ;  Home with mother.  [name] A 09/24/2010, 8:02 AM

## 2010-09-25 NOTE — Progress Notes (Signed)
UR chart review completed.  

## 2010-09-27 NOTE — Anesthesia Postprocedure Evaluation (Signed)
  Anesthesia Post-op Note  Patient: [name]  Procedure(s) Performed: Epidural  Patient Location: Labor and Delivery  Anesthesia Type: Epidural  Level of Consciousness: awake and alert   Airway and Oxygen Therapy: Patient Spontanous Breathing  Post-op Pain: none  Post-op Assessment: Post-op Vital signs reviewed  Post-op Vital Signs: Reviewed and stable  Complications: No apparent anesthesia complications

## 2010-09-28 ENCOUNTER — Inpatient Hospital Stay (HOSPITAL_COMMUNITY): Admission: RE | Admit: 2010-09-28 | Payer: Medicaid Other | Source: Ambulatory Visit

## 2010-11-27 ENCOUNTER — Emergency Department (HOSPITAL_COMMUNITY)
Admission: EM | Admit: 2010-11-27 | Discharge: 2010-11-27 | Disposition: A | Discharge status: Home / Self Care | Payer: Medicaid Other | Attending: Emergency Medicine | Admitting: Emergency Medicine

## 2010-11-27 ENCOUNTER — Emergency Department (HOSPITAL_COMMUNITY): Payer: Medicaid Other

## 2010-11-27 DIAGNOSIS — B9789 Other viral agents as the cause of diseases classified elsewhere: Secondary | ICD-10-CM | POA: Insufficient documentation

## 2010-11-27 DIAGNOSIS — R05 Cough: Secondary | ICD-10-CM | POA: Insufficient documentation

## 2010-11-27 DIAGNOSIS — J069 Acute upper respiratory infection, unspecified: Secondary | ICD-10-CM | POA: Insufficient documentation

## 2010-11-27 DIAGNOSIS — R059 Cough, unspecified: Secondary | ICD-10-CM | POA: Insufficient documentation

## 2010-11-27 DIAGNOSIS — R55 Syncope and collapse: Secondary | ICD-10-CM | POA: Insufficient documentation

## 2010-11-27 DIAGNOSIS — E860 Dehydration: Secondary | ICD-10-CM | POA: Insufficient documentation

## 2010-11-27 DIAGNOSIS — K5289 Other specified noninfective gastroenteritis and colitis: Secondary | ICD-10-CM | POA: Insufficient documentation

## 2010-11-27 LAB — URINE MICROSCOPIC-ADD ON

## 2010-11-27 LAB — POCT I-STAT TROPONIN I: Troponin i, poc: 0.01 ng/mL (ref 0.00–0.08)

## 2010-11-27 LAB — CBC
HCT: 38.8 % (ref 36.0–46.0)
Platelets: 286 10*3/uL (ref 150–400)
RBC: 4.41 MIL/uL (ref 3.87–5.11)
RDW: 14 % (ref 11.5–15.5)
WBC: 11 10*3/uL — ABNORMAL HIGH (ref 4.0–10.5)

## 2010-11-27 LAB — URINALYSIS, ROUTINE W REFLEX MICROSCOPIC
Bilirubin Urine: NEGATIVE
Glucose, UA: NEGATIVE mg/dL
Hgb urine dipstick: NEGATIVE
Ketones, ur: NEGATIVE mg/dL
Nitrite: NEGATIVE
Specific Gravity, Urine: 1.022 (ref 1.005–1.030)
pH: 7 (ref 5.0–8.0)

## 2010-11-27 LAB — DIFFERENTIAL
Basophils Absolute: 0.1 10*3/uL (ref 0.0–0.1)
Eosinophils Relative: 2 % (ref 0–5)
Lymphocytes Relative: 14 % (ref 12–46)
Neutrophils Relative %: 74 % (ref 43–77)

## 2010-11-27 LAB — CK TOTAL AND CKMB (NOT AT ARMC)
CK, MB: 1.2 ng/mL (ref 0.3–4.0)
Relative Index: INVALID (ref 0.0–2.5)

## 2010-11-27 LAB — COMPREHENSIVE METABOLIC PANEL
Albumin: 3.6 g/dL (ref 3.5–5.2)
BUN: 15 mg/dL (ref 6–23)
Creatinine, Ser: 0.57 mg/dL (ref 0.50–1.10)
Potassium: 4.5 mEq/L (ref 3.5–5.1)
Total Protein: 7.3 g/dL (ref 6.0–8.3)

## 2010-11-27 LAB — LIPASE, BLOOD: Lipase: 92 U/L — ABNORMAL HIGH (ref 11–59)

## 2010-11-27 LAB — TROPONIN I: Troponin I: <0.3 ng/mL (ref <0.30)

## 2010-12-26 ENCOUNTER — Encounter (HOSPITAL_COMMUNITY): Payer: Self-pay | Admitting: *Deleted

## 2011-01-14 ENCOUNTER — Encounter (HOSPITAL_COMMUNITY): Payer: Self-pay | Admitting: *Deleted

## 2011-01-14 ENCOUNTER — Inpatient Hospital Stay (HOSPITAL_COMMUNITY)
Admission: AD | Admit: 2011-01-14 | Discharge: 2011-01-14 | Disposition: A | Discharge status: Home / Self Care | Payer: Medicaid Other | Source: Ambulatory Visit | Attending: Obstetrics and Gynecology | Admitting: Obstetrics and Gynecology

## 2011-01-14 DIAGNOSIS — B009 Herpesviral infection, unspecified: Secondary | ICD-10-CM

## 2011-01-14 DIAGNOSIS — A6000 Herpesviral infection of urogenital system, unspecified: Secondary | ICD-10-CM | POA: Insufficient documentation

## 2011-01-14 DIAGNOSIS — N949 Unspecified condition associated with female genital organs and menstrual cycle: Secondary | ICD-10-CM | POA: Insufficient documentation

## 2011-01-14 HISTORY — DX: Nonrheumatic mitral (valve) prolapse: I34.1

## 2011-01-14 LAB — URINALYSIS, ROUTINE W REFLEX MICROSCOPIC
Bilirubin Urine: NEGATIVE
Nitrite: NEGATIVE
Specific Gravity, Urine: 1.015 (ref 1.005–1.030)
pH: 7 (ref 5.0–8.0)

## 2011-01-14 LAB — URINE MICROSCOPIC-ADD ON

## 2011-01-14 LAB — WET PREP, GENITAL: Trich, Wet Prep: NONE SEEN

## 2011-01-14 LAB — POCT PREGNANCY, URINE: Preg Test, Ur: NEGATIVE

## 2011-01-14 NOTE — ED Provider Notes (Signed)
History     Chief Complaint  Patient presents with  . Vaginal Pain   HPI [name] 25 y.o. LMP 12-08-10.  Wants to be pregnant.  (Husband will be having chemo soon for intestinal cancer.)   History of HSV 2.  Is on suppression but forgot medication for several days.     OB History    Grav Para Term Preterm Abortions TAB SAB Ect Mult Living   1 1 1  0 0 0 0 0 0 1      Past Medical History  Diagnosis Date  . Herpes genitalia   . HSV (herpes simplex virus) infection     last outbreak in may  . Mitral valve prolapse     Past Surgical History  Procedure Date  . Cardiac surgery     congenital heart defect repair at 9 mos    No family history on file.  History  Substance Use Topics  . Smoking status: Former Games developer  . Smokeless tobacco: Not on file  . Alcohol Use: Yes     occasional alcohol    Allergies: No Known Allergies  Prescriptions prior to admission  Medication Sig Dispense Refill  . valACYclovir (VALTREX) 500 MG tablet Take 500 mg by mouth daily.         Review of Systems  Genitourinary:       Vaginal pain   Physical Exam   Blood pressure 110/62, pulse 83, temperature 98.2 F (36.8 C), resp. rate 16, height 4\' 10"  (1.473 m), weight 142 lb (64.411 kg), last menstrual period 12/08/2010.  Physical Exam  Nursing note and vitals reviewed. Constitutional: She is oriented to person, place, and time. She appears well-developed and well-nourished.  HENT:  Head: Normocephalic.  Eyes: EOM are normal.  Neck: Neck supple.  GI: Soft. There is no tenderness.  Genitourinary:       Speculum exam: Vulva- pink rash noted on both labia, one scabbed ulcer area above clitoris noted - area is tender Vagina -  No discharge, no odor Cervix - No contact bleeding Bimanual exam deferred GC/Chlam, wet prep done Chaperone present for exam.  Musculoskeletal: Normal range of motion.  Neurological: She is alert and oriented to person, place, and time.  Skin: Skin is  warm and dry.  Psychiatric: She has a normal mood and affect.    MAU Course  Procedures Results for orders placed during the hospital encounter of 01/14/11 (from the past 24 hour(s))  URINALYSIS, ROUTINE W REFLEX MICROSCOPIC     Status: Abnormal   Collection Time   01/14/11  2:40 PM      Component Value Range   Color, Urine YELLOW  YELLOW    Appearance HAZY (*) CLEAR    Specific Gravity, Urine 1.015  1.005 - 1.030    pH 7.0  5.0 - 8.0    Glucose, UA NEGATIVE  NEGATIVE (mg/dL)   Hgb urine dipstick NEGATIVE  NEGATIVE    Bilirubin Urine NEGATIVE  NEGATIVE    Ketones, ur NEGATIVE  NEGATIVE (mg/dL)   Protein, ur NEGATIVE  NEGATIVE (mg/dL)   Urobilinogen, UA 1.0  0.0 - 1.0 (mg/dL)   Nitrite NEGATIVE  NEGATIVE    Leukocytes, UA SMALL (*) NEGATIVE   URINE MICROSCOPIC-ADD ON     Status: Abnormal   Collection Time   01/14/11  2:40 PM      Component Value Range   Squamous Epithelial / LPF FEW (*) RARE    WBC, UA 0-2  <3 (WBC/hpf)  RBC / HPF 0-2  <3 (RBC/hpf)   Bacteria, UA FEW (*) RARE   POCT PREGNANCY, URINE     Status: Normal   Collection Time   01/14/11  2:55 PM      Component Value Range   Preg Test, Ur NEGATIVE      MDM Likely has an HSV 2 outbreak due to missing doses of suppression.  Assessment and Plan  HSV2 outbreak  Plan:  Valtrex 1 gm by mouth daily for 5 days for outbreak and then resume suppression dosage. Reschedule your appointment in the office.   [name] 01/14/2011, 3:10 PM   Nolene Bernheim, NP 01/14/11 1518

## 2011-01-14 NOTE — Progress Notes (Signed)
Onset of vaginal pain x 1 week, thinks has a cut on inside of labia, two negative home pregnancy test and one positive, history of abnormal pap was to get a recheck on 10/24 did not go because of new job.

## 2011-01-15 LAB — GC/CHLAMYDIA PROBE AMP, GENITAL
Chlamydia, DNA Probe: NEGATIVE
GC Probe Amp, Genital: NEGATIVE

## 2011-03-15 ENCOUNTER — Emergency Department (HOSPITAL_COMMUNITY)
Admission: EM | Admit: 2011-03-15 | Discharge: 2011-03-16 | Disposition: A | Discharge status: Home / Self Care | Payer: Self-pay | Attending: Emergency Medicine | Admitting: Emergency Medicine

## 2011-03-15 ENCOUNTER — Emergency Department (HOSPITAL_COMMUNITY): Payer: Self-pay

## 2011-03-15 ENCOUNTER — Encounter (HOSPITAL_COMMUNITY): Payer: Self-pay

## 2011-03-15 ENCOUNTER — Other Ambulatory Visit: Payer: Self-pay

## 2011-03-15 DIAGNOSIS — R0789 Other chest pain: Secondary | ICD-10-CM

## 2011-03-15 DIAGNOSIS — R059 Cough, unspecified: Secondary | ICD-10-CM | POA: Insufficient documentation

## 2011-03-15 DIAGNOSIS — R509 Fever, unspecified: Secondary | ICD-10-CM | POA: Insufficient documentation

## 2011-03-15 DIAGNOSIS — R070 Pain in throat: Secondary | ICD-10-CM | POA: Insufficient documentation

## 2011-03-15 DIAGNOSIS — R05 Cough: Secondary | ICD-10-CM | POA: Insufficient documentation

## 2011-03-15 DIAGNOSIS — R0602 Shortness of breath: Secondary | ICD-10-CM | POA: Insufficient documentation

## 2011-03-15 DIAGNOSIS — R071 Chest pain on breathing: Secondary | ICD-10-CM | POA: Insufficient documentation

## 2011-03-15 DIAGNOSIS — R110 Nausea: Secondary | ICD-10-CM | POA: Insufficient documentation

## 2011-03-15 DIAGNOSIS — N390 Urinary tract infection, site not specified: Secondary | ICD-10-CM | POA: Insufficient documentation

## 2011-03-15 LAB — BASIC METABOLIC PANEL
BUN: 11 mg/dL (ref 6–23)
CO2: 27 mEq/L (ref 19–32)
Chloride: 102 mEq/L (ref 96–112)
Creatinine, Ser: 0.56 mg/dL (ref 0.50–1.10)

## 2011-03-15 LAB — URINALYSIS, ROUTINE W REFLEX MICROSCOPIC
Bilirubin Urine: NEGATIVE
Ketones, ur: NEGATIVE mg/dL
Nitrite: NEGATIVE
Urobilinogen, UA: 1 mg/dL (ref 0.0–1.0)
pH: 7 (ref 5.0–8.0)

## 2011-03-15 LAB — DIFFERENTIAL
Basophils Absolute: 0 10*3/uL (ref 0.0–0.1)
Basophils Relative: 0 % (ref 0–1)
Eosinophils Relative: 4 % (ref 0–5)
Monocytes Absolute: 0.6 10*3/uL (ref 0.1–1.0)

## 2011-03-15 LAB — CBC
HCT: 37 % (ref 36.0–46.0)
MCH: 30 pg (ref 26.0–34.0)
MCHC: 34.1 g/dL (ref 30.0–36.0)
MCV: 88.1 fL (ref 78.0–100.0)
RDW: 12.8 % (ref 11.5–15.5)

## 2011-03-15 LAB — URINE MICROSCOPIC-ADD ON

## 2011-03-15 LAB — POCT I-STAT TROPONIN I: Troponin i, poc: 0 ng/mL (ref 0.00–0.08)

## 2011-03-15 MED ORDER — NITROFURANTOIN MONOHYD MACRO 100 MG PO CAPS: 100.0000 mg | ORAL_CAPSULE | Freq: Two times a day (BID) | ORAL | Status: AC

## 2011-03-15 NOTE — ED Notes (Signed)
Pt presents with 1 week h/o chest tightness and shortness of breath.  Pt reports pain is to midsternal area, reports pain has been constant with radiation of pain to midscapula and down R arm today.  Pt denies any cough.  Pt reports pain worsens with deep inspiration and when she leaned over today, pt reports a "popping" sensation to chest.

## 2011-03-15 NOTE — ED Provider Notes (Signed)
History     CSN: [id number]  Arrival date & time 03/15/11  1550   First MD Initiated Contact with Patient 03/15/11 2023      Chief Complaint  Patient presents with  . Chest Pain    (Consider location/radiation/quality/duration/timing/severity/associated sxs/prior treatment) HPI Comments: Presents w chest pressure around Dec 25th that developed into CP started today. Pain is constant and pleuritic in nature. Does not radiate. CP also reports am nausea. Pt had flu like s/s last week including SOB, cough, fever, sore throat. Pt currently on birthcontrol, denies hemoptysis, smoking, recent travel, surgery, claudication and leg swelling   Dr. Tenny Craw- Cardiology, prolapse repair and "hole in heart repair" possible VSD vs ASD?  Patient is a 26 y.o. female presenting with chest pain. The history is provided by the patient.  Chest Pain The chest pain began more than 2 weeks ago. Chest pain occurs intermittently. The chest pain is unchanged. The pain is associated with breathing. At its most intense, the pain is at 7/10. The pain is currently at 2/10. The severity of the pain is mild. The quality of the pain is described as pressure-like and pleuritic. The pain does not radiate. Chest pain is worsened by deep breathing. Primary symptoms include nausea. Pertinent negatives for primary symptoms include no fever, no fatigue, no syncope, no shortness of breath, no cough, no wheezing, no palpitations, no abdominal pain, no vomiting, no dizziness and no altered mental status.  Pertinent negatives for associated symptoms include no claudication, no diaphoresis, no lower extremity edema, no near-syncope, no numbness, no orthopnea, no paroxysmal nocturnal dyspnea and no weakness. She tried nothing for the symptoms. 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  Her past medical history is significant for anxiety/panic attacks.  Pertinent negatives for past medical history include no aortic  dissection, no arrhythmia, no CAD, no cancer, no COPD, no CHF, no diabetes, no DVT, no hyperlipidemia, no hypertension, no MI, no pacemaker, no PVD and no TIA. Procedure history comments: Pt followed by cardiologist yearly till she was cleqared at 26 yo.     Past Medical History  Diagnosis Date  . Herpes genitalia   . HSV (herpes simplex virus) infection     last outbreak in may  . Mitral valve prolapse     Past Surgical History  Procedure Date  . Cardiac surgery     congenital heart defect repair at 9 mos  . Exploration post operative open heart     No family history on file.  History  Substance Use Topics  . Smoking status: Former Games developer  . Smokeless tobacco: Not on file  . Alcohol Use: Yes     occasional alcohol    OB History    Grav Para Term Preterm Abortions TAB SAB Ect Mult Living   1 1 1  0 0 0 0 0 0 1      Review of Systems  Constitutional: Negative for fever, diaphoresis and fatigue.  Respiratory: Negative for cough, shortness of breath and wheezing.   Cardiovascular: Positive for chest pain. Negative for palpitations, orthopnea, claudication, syncope and near-syncope.  Gastrointestinal: Positive for nausea. Negative for vomiting and abdominal pain.  Neurological: Negative for dizziness, weakness and numbness.  Psychiatric/Behavioral: Negative for altered mental status.    Allergies  Review of patient's allergies indicates no known allergies.  Home Medications   Current Outpatient Rx  Name Route Sig Dispense Refill  . CETIRIZINE HCL 10 MG PO TABS Oral Take 10 mg by  mouth daily.      . NORETHINDRONE ACET-ETHINYL EST 1-20 MG-MCG PO TABS Oral Take 1 tablet by mouth daily.      Marland Kitchen VALACYCLOVIR HCL 500 MG PO TABS Oral Take 500 mg by mouth daily.       BP 106/69  Pulse 71  Temp(Src) 98.8 F (37.1 C) (Oral)  Resp 16  Ht 4\' 11"  (1.499 m)  Wt 140 lb (63.504 kg)  BMI 28.28 kg/m2  SpO2 98%  LMP 03/02/2010  Physical Exam  Nursing note and vitals  reviewed. Constitutional: She is oriented to person, place, and time. She appears well-developed and well-nourished. No distress.  HENT:  Head: Normocephalic and atraumatic.  Eyes: Conjunctivae and EOM are normal. Pupils are equal, round, and reactive to light.       Normal appearance  Neck: Normal range of motion. Neck supple. No JVD present.  Cardiovascular: Normal rate and regular rhythm.   Pulmonary/Chest: Effort normal and breath sounds normal. No stridor. No respiratory distress. She exhibits no tenderness.    Abdominal: Soft. Bowel sounds are normal. She exhibits no distension. There is no tenderness. There is no guarding.  Musculoskeletal: She exhibits no edema and no tenderness.       2+ Dorsalis Pedis pulses  Neurological: She is alert and oriented to person, place, and time.  Skin: Skin is warm and dry. No rash noted. She is not diaphoretic.  Psychiatric: She has a normal mood and affect. Her behavior is normal.    ED Course  Procedures (including critical care time)  Labs Reviewed  PRO B NATRIURETIC PEPTIDE - Abnormal; Notable for the following:    Pro B Natriuretic peptide (BNP) 257.6 (*)    All other components within normal limits  BASIC METABOLIC PANEL - Abnormal; Notable for the following:    Glucose, Bld 102 (*)    All other components within normal limits  URINALYSIS, ROUTINE W REFLEX MICROSCOPIC - Abnormal; Notable for the following:    APPearance CLOUDY (*)    Leukocytes, UA MODERATE (*)    All other components within normal limits  URINE MICROSCOPIC-ADD ON - Abnormal; Notable for the following:    Squamous Epithelial / LPF MANY (*)    Bacteria, UA MANY (*)    All other components within normal limits  CBC  DIFFERENTIAL  PREGNANCY, URINE  D-DIMER, QUANTITATIVE  POCT I-STAT TROPONIN I  I-STAT TROPONIN I   Dg Chest 2 View  03/15/2011  *RADIOLOGY REPORT*  Clinical Data: Shortness of breath, cough, and chest pain.  History of heart surgery.  CHEST - 2 VIEW   Comparison: 11/27/2010  Findings: Mild cardiac enlargement with some increased pulmonary vascularity, stable since previous study.  No perihilar edema.  No focal airspace consolidation in the lungs.  No blunting of costophrenic angles.  No pneumothorax.  No pleural effusions.  No pneumothorax. Surgical clips in the mediastinum.  IMPRESSION: Cardiac enlargement with mild prominence of pulmonary vascularity may suggest mild congestive change although this is stable since previous studies.  No focal airspace consolidation.  Original Report Authenticated By: Marlon Pel, M.D.     No diagnosis found.  11:20 PM  Patient reevaluated, is hemodynamically stable, in no acute distress, and states that she is not currently having chest pain.  Patient has been advised to take over the counter anti-inflammatories ibuprofen and Aleve to help treat her chest pain.  She also will followup with cardiology on Monday.   I discussed all lab and imaging reports with  patient and mother.  They're agreeable with plan to discharge patient.  MDM  UTI  Chest wall tenderness  Pts CXR shows no new abnormalities per cardiology. Likely dx of chest well tenderness s/p viral infection. Pt and mother states they can f-u w cardiology on Monday.  Patient will be discharged with Macrobid as treatment for her urinary tract infection.        [place], [place] 03/15/11 2321

## 2011-03-15 NOTE — ED Provider Notes (Signed)
Medical screening examination/treatment/procedure(s) were performed by non-physician practitioner and as supervising physician I was immediately available for consultation/collaboration.   Salam Micucci, MD 03/15/11 2344 

## 2011-03-15 NOTE — ED Notes (Signed)
Husband and grandmother both stated that pt stresses very easily/ pt smiling and giggly. NAD. Has cardiac hx as an infant. Conversing and laughing with family about vacations and such. NAD.

## 2011-03-16 ENCOUNTER — Telehealth: Payer: Self-pay | Admitting: Internal Medicine

## 2011-03-16 NOTE — Telephone Encounter (Signed)
Pt was in [facility] ED last with chest pain and pressure saw Dr. Drue Novel and he feels she needs to be seen today pt is still having chest pain and pressure now no other symptoms

## 2011-03-16 NOTE — ED Notes (Signed)
PT respiration even and unlabored; A&Ox3; no signs of distress; no questions at this time; skin warm and dry.

## 2011-03-16 NOTE — Telephone Encounter (Signed)
Spoke with pt and scheduled to see Tereso Newcomer on 03/19/2011.

## 2011-03-19 ENCOUNTER — Encounter: Payer: Self-pay | Admitting: Physician Assistant

## 2011-03-19 ENCOUNTER — Ambulatory Visit (INDEPENDENT_AMBULATORY_CARE_PROVIDER_SITE_OTHER): Payer: Medicaid Other | Admitting: Physician Assistant

## 2011-03-19 DIAGNOSIS — R079 Chest pain, unspecified: Secondary | ICD-10-CM

## 2011-03-19 DIAGNOSIS — Q242 Cor triatriatum: Secondary | ICD-10-CM

## 2011-03-19 DIAGNOSIS — I498 Other specified cardiac arrhythmias: Secondary | ICD-10-CM

## 2011-03-19 NOTE — Assessment & Plan Note (Signed)
Given her recent trip to the emergency room with chest discomfort and her junctional rhythm, I will set her up for a followup echocardiogram.  I will set her up for followup with Dr. Tenny Craw in the next 2-4 weeks.

## 2011-03-19 NOTE — Assessment & Plan Note (Signed)
Her chest pain is atypical.  I suspect she has pleurisy from her recent viral illness.  She also seems to have some chest wall discomfort.  I have recommended continued NSAID use.  She will use ibuprofen 400 mg 3 times a day for 2 weeks.  No further cardiovascular workup is necessary at this time.

## 2011-03-19 NOTE — Assessment & Plan Note (Signed)
She is currently asymptomatic.  I will set her up for a 48-hour Holter monitor.  Check an echocardiogram as noted.  I will refer her to electrophysiology for further recommendations.

## 2011-03-19 NOTE — Progress Notes (Signed)
[address] [place], [place]  [address] Phone: [phone] Fax:  [phone]  Date:  03/19/2011   Name:  [name]       DOB:  [birth year] MRN:  [id number]  Primary Cardiologist:  Dr. Dietrich Pates  Primary Electrophysiologist:  None    History of Present Illness: [name] is a 26 y.o. female who presents for post ED visit.  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  She was previously followed by Dr. Fletcher Anon.  She established with Dr. Dietrich Pates in 6/09.  Echocardiogram 12/05: EF 55-65%, no mitral valve prolapse, mild RVE and no mention of any residual ASD.    She was seen in the emergency room 03/14/10 with chest pain.  Labs: D-dimer less than 0.22, potassium 3.9, creatinine 0.56, pro BNP 257.6, hemoglobin 12.6, urine pregnancy test negative.  Troponin was negative.  Chest x-ray: Cardiac enlargement with mild prominence of pulmonary vascularity may suggest mild congestive change although this is stable since previous studies; no focal air space consolidation.  ECG demonstrated sinus rhythm versus accelerated junctional rhythm and nonspecific changes.  She was discharged from the ED with antibiotics for a UTI and was also advised to use NSAIDs for her musculoskeletal chest pain.  She was also advised to follow up here.  She had a flu like illness several weeks ago.  She notes significant cough.  About 2 weeks ago, she developed chest pain that is tight.  It is constant.  It is worsened by emotional stress.  She also notes worsening pain with deep breathing.  She denies orthopnea or edema.  She did have an episode of awakening with shortness of breath recently.  This does not sound like classic PND.  Her chest pain is reproducible with palpation.  She also notes fluttering in her chest at times.  She denies syncope.  Of note, she did have an episode of syncope several  months ago when she was quite ill with high fevers.  She has had no recurrence.  She denies near syncope or lightheadedness.  Past Medical History  Diagnosis Date  . Herpes genitalia   . HSV (herpes simplex virus) infection     last outbreak in may  . Mitral valve prolapse   . Status post repair of cor triatriatum     Echocardiogram 12/05: EF 55-65%, no mitral valve prolapse, mild RVE and no mention of any residual ASD.      Current Outpatient Prescriptions  Medication Sig Dispense Refill  . cetirizine (ZYRTEC) 10 MG tablet Take 10 mg by mouth daily.        . nitrofurantoin, macrocrystal-monohydrate, (MACROBID) 100 MG capsule Take 1 capsule (100 mg total) by mouth 2 (two) times daily.  10 capsule  0  . norethindrone-ethinyl estradiol (MICROGESTIN,JUNEL,LOESTRIN) 1-20 MG-MCG tablet Take 1 tablet by mouth daily.        . valACYclovir (VALTREX) 500 MG tablet Take 500 mg by mouth daily.         Allergies: No Known Allergies  History  Substance Use Topics  . Smoking status: Former Games developer  . Smokeless tobacco: Not on file  . Alcohol Use: Yes     occasional alcohol     ROS:  Please see the history of present illness.   All other systems reviewed and negative.   PHYSICAL EXAM: VS:  BP 104/70  Pulse 83  Ht 4'  10" (1.473 m)  Wt 147 lb (66.679 kg)  BMI 30.72 kg/m2  LMP 03/02/2010 Well nourished, well developed, in no acute distress HEENT: normal Neck: no JVD Cardiac:  normal S1, S2; RRR; no murmur Lungs:  clear to auscultation bilaterally, no wheezing, rhonchi or rales Abd: soft, nontender, no hepatomegaly Ext: no edema Skin: warm and dry Neuro:  CNs 2-12 intact, no focal abnormalities noted  EKG:   Junctional rhythm with a heart rate of 80, rightward axis, nonspecific ST-T wave changes, frequent PVCs.  ASSESSMENT AND PLAN:

## 2011-03-19 NOTE — Patient Instructions (Signed)
Your physician recommends that you schedule a follow-up appointment in: 2-4 weeks with Dr Filbert Schilder have been referred to EP  Your physician has requested that you have an echocardiogram. Echocardiography is a painless test that uses sound waves to create images of your heart. It provides your doctor with information about the size and shape of your heart and how well your heart's chambers and valves are working. This procedure takes approximately one hour. There are no restrictions for this procedure.  Your physician has recommended that you wear a holter monitor. Holter monitors are medical devices that record the heart's electrical activity. Doctors most often use these monitors to diagnose arrhythmias. Arrhythmias are problems with the speed or rhythm of the heartbeat. The monitor is a small, portable device. You can wear one while you do your normal daily activities. This is usually used to diagnose what is causing palpitations/syncope (passing out).  Your physician has recommended you make the following change in your medication: Take Ibuprofen 400mg  every 8 hours for 14 days.  Make sure you take it with food.  Take as needed after the 14 days

## 2011-04-02 ENCOUNTER — Other Ambulatory Visit (HOSPITAL_COMMUNITY): Payer: Medicaid Other | Admitting: Radiology

## 2011-04-03 ENCOUNTER — Other Ambulatory Visit: Payer: Self-pay | Admitting: Physician Assistant

## 2011-04-03 ENCOUNTER — Encounter (INDEPENDENT_AMBULATORY_CARE_PROVIDER_SITE_OTHER): Payer: Self-pay

## 2011-04-03 DIAGNOSIS — I498 Other specified cardiac arrhythmias: Secondary | ICD-10-CM

## 2011-04-03 DIAGNOSIS — R079 Chest pain, unspecified: Secondary | ICD-10-CM

## 2011-04-04 ENCOUNTER — Other Ambulatory Visit (HOSPITAL_COMMUNITY): Payer: Medicaid Other | Admitting: Radiology

## 2011-04-13 ENCOUNTER — Encounter (INDEPENDENT_AMBULATORY_CARE_PROVIDER_SITE_OTHER): Payer: BC Managed Care – PPO

## 2011-04-13 ENCOUNTER — Ambulatory Visit (HOSPITAL_COMMUNITY): Payer: BC Managed Care – PPO | Attending: Internal Medicine | Admitting: Radiology

## 2011-04-13 DIAGNOSIS — R0989 Other specified symptoms and signs involving the circulatory and respiratory systems: Secondary | ICD-10-CM

## 2011-04-13 DIAGNOSIS — Q2111 Secundum atrial septal defect: Secondary | ICD-10-CM | POA: Insufficient documentation

## 2011-04-13 DIAGNOSIS — Q242 Cor triatriatum: Secondary | ICD-10-CM

## 2011-04-13 DIAGNOSIS — R079 Chest pain, unspecified: Secondary | ICD-10-CM | POA: Insufficient documentation

## 2011-04-13 DIAGNOSIS — I498 Other specified cardiac arrhythmias: Secondary | ICD-10-CM

## 2011-04-13 DIAGNOSIS — Q211 Atrial septal defect: Secondary | ICD-10-CM | POA: Insufficient documentation

## 2011-04-13 DIAGNOSIS — I059 Rheumatic mitral valve disease, unspecified: Secondary | ICD-10-CM | POA: Insufficient documentation

## 2011-04-13 DIAGNOSIS — R072 Precordial pain: Secondary | ICD-10-CM

## 2011-04-16 ENCOUNTER — Encounter: Payer: Self-pay | Admitting: Internal Medicine

## 2011-04-16 ENCOUNTER — Ambulatory Visit (INDEPENDENT_AMBULATORY_CARE_PROVIDER_SITE_OTHER): Payer: BC Managed Care – PPO | Admitting: Internal Medicine

## 2011-04-16 DIAGNOSIS — Q242 Cor triatriatum: Secondary | ICD-10-CM

## 2011-04-16 DIAGNOSIS — Z720 Tobacco use: Secondary | ICD-10-CM

## 2011-04-16 DIAGNOSIS — I498 Other specified cardiac arrhythmias: Secondary | ICD-10-CM

## 2011-04-16 DIAGNOSIS — F17200 Nicotine dependence, unspecified, uncomplicated: Secondary | ICD-10-CM

## 2011-04-16 DIAGNOSIS — R079 Chest pain, unspecified: Secondary | ICD-10-CM

## 2011-04-16 NOTE — Progress Notes (Signed)
HPI Patient is a 26 year old with a history of cor triatriatum and small ASD.  S/P repari of both.  I last saw her in 2009. Patient went to ER recently for CP.  Felt to be atypical. Seen by Wende Mott in follow up.  EKG with junctional rhythm Set up for holter and echo Holter showed SR with PAC/PVC and occasional junctional escape beats.  Symptoms did not correlate wit arrhythmia. Echo was very difficult due to acoustic windows.  LVEF was grossly normal  RV was enlarged.  Could not get old echo to compare. Since seen she has intermitt pain.  Not associated with activty except may be exacerbated by cough though not always.   No Known Allergies  Current Outpatient Prescriptions  Medication Sig Dispense Refill  . cetirizine (ZYRTEC) 10 MG tablet Take 10 mg by mouth daily.        . norethindrone-ethinyl estradiol (MICROGESTIN,JUNEL,LOESTRIN) 1-20 MG-MCG tablet Take 1 tablet by mouth daily.        . valACYclovir (VALTREX) 500 MG tablet Take 500 mg by mouth daily.         Past Medical History  Diagnosis Date  . Herpes genitalia   . HSV (herpes simplex virus) infection     last outbreak in may  . Mitral valve prolapse   . Status post repair of cor triatriatum     Echocardiogram 12/05: EF 55-65%, no mitral valve prolapse, mild RVE and no mention of any residual ASD.      Past Surgical History  Procedure Date  . Cardiac surgery     congenital heart defect repair at 9 mos  . Exploration post operative open heart     No family history on file.  History   Social History  . Marital Status: Married    Spouse Name: N/A    Number of Children: N/A  . Years of Education: N/A   Occupational History  . Not on file.   Social History Main Topics  . Smoking status: Former Games developer  . Smokeless tobacco: Not on file  . Alcohol Use: Yes     occasional alcohol  . Drug Use: No  . Sexually Active: Yes   Other Topics Concern  . Not on file   Social History Narrative  . No narrative on  file    Review of Systems:  All systems reviewed.  They are negative to the above problem except as previously stated.  Vital Signs: Filed Vitals:   04/16/11 1610  BP: 100/80  Pulse: 74   Filed Vitals:   04/16/11 1610  Height: 4\' 10"  (1.473 m)  Weight: 149 lb (67.586 kg)     Physical Exam  Patient is in NAD  HEENT:  Normocephalic, atraumatic. EOMI, PERRLA.  Neck: JVP is normal. No thyromegaly. No bruits.  Lungs: clear to auscultation. No rales no wheezes.  Heart: Regular rate and rhythm. Normal S1, S2. No S3.   No significant murmurs. PMI not displaced. Chest:  Tender to palpation.  Reproduces pain.  Abdomen:  Supple, nontender. Normal bowel sounds. No masses. No hepatomegaly.  Extremities:   Good distal pulses throughout. No lower extremity edema.  Musculoskeletal :moving all extremities.  Neuro:   alert and oriented x3.  CN II-XII grossly intact.   Assessment and Plan:

## 2011-04-18 ENCOUNTER — Institutional Professional Consult (permissible substitution): Payer: Medicaid Other | Admitting: Internal Medicine

## 2011-04-18 DIAGNOSIS — Z720 Tobacco use: Secondary | ICD-10-CM | POA: Insufficient documentation

## 2011-04-18 NOTE — Assessment & Plan Note (Signed)
Treated surgically.

## 2011-04-18 NOTE — Assessment & Plan Note (Signed)
I think Cp is noncardiac.  Appears muscular.  I would avoid overexertion.  Can take NSAIDS carefully

## 2011-04-18 NOTE — Assessment & Plan Note (Signed)
Counselled on quitting.  Discussed danger of smoking and use of OCPs.  I would not recomm unless she quits.

## 2011-04-18 NOTE — Assessment & Plan Note (Signed)
Holter without signif arrhythmia.  Patient asymptomatic  I would not pursue further.  I do not expect progression.

## 2011-07-05 ENCOUNTER — Encounter (HOSPITAL_COMMUNITY): Payer: Self-pay | Admitting: *Deleted

## 2011-07-05 ENCOUNTER — Inpatient Hospital Stay (HOSPITAL_COMMUNITY)
Admission: AD | Admit: 2011-07-05 | Discharge: 2011-07-05 | Disposition: A | Discharge status: Home / Self Care | Payer: Medicaid Other | Source: Ambulatory Visit | Attending: Obstetrics & Gynecology | Admitting: Obstetrics & Gynecology

## 2011-07-05 ENCOUNTER — Inpatient Hospital Stay (HOSPITAL_COMMUNITY): Payer: Medicaid Other

## 2011-07-05 DIAGNOSIS — O209 Hemorrhage in early pregnancy, unspecified: Secondary | ICD-10-CM | POA: Insufficient documentation

## 2011-07-05 DIAGNOSIS — O418X90 Other specified disorders of amniotic fluid and membranes, unspecified trimester, not applicable or unspecified: Secondary | ICD-10-CM | POA: Diagnosis present

## 2011-07-05 DIAGNOSIS — R109 Unspecified abdominal pain: Secondary | ICD-10-CM | POA: Insufficient documentation

## 2011-07-05 DIAGNOSIS — Z3490 Encounter for supervision of normal pregnancy, unspecified, unspecified trimester: Secondary | ICD-10-CM

## 2011-07-05 DIAGNOSIS — O468X9 Other antepartum hemorrhage, unspecified trimester: Secondary | ICD-10-CM | POA: Diagnosis present

## 2011-07-05 LAB — URINALYSIS, ROUTINE W REFLEX MICROSCOPIC
Glucose, UA: NEGATIVE mg/dL
Hgb urine dipstick: NEGATIVE
Protein, ur: NEGATIVE mg/dL
Specific Gravity, Urine: <1.005 — ABNORMAL LOW (ref 1.005–1.030)

## 2011-07-05 LAB — HCG, QUANTITATIVE, PREGNANCY: hCG, Beta Chain, Quant, S: 13613 m[IU]/mL — ABNORMAL HIGH (ref <5)

## 2011-07-05 LAB — POCT PREGNANCY, URINE: Preg Test, Ur: POSITIVE — AB

## 2011-07-05 NOTE — MAU Provider Note (Signed)
Attestation of Attending Supervision of Advanced Practitioner: Evaluation and management procedures were performed by the [facility] Fellow/PA/CNM/NP under my supervision and collaboration. Chart reviewed, and agree with management and plan.  Jaynie Collins, M.D. 07/05/2011 10:07 PM

## 2011-07-05 NOTE — MAU Note (Signed)
Pt states she started having spotting at work.pt describes the spotting as when she first gets her menstrual

## 2011-07-05 NOTE — MAU Note (Signed)
Pt states lmp-05/08/2011, started bleeding and cramping this afternoon around 1430, pain bilaterally, rates 6/10. Spotting, has not seen frank red blood. Not wearing pad at present. Denies vaginal d/c changes.

## 2011-07-05 NOTE — Discharge Instructions (Signed)
Subchorionic Hematoma  A subchorionic hematoma is a gathering of blood between the outer wall of the placenta and the inner wall of the womb (uterus). The placenta is the organ which connects the fetus to the wall of the uterus. The placenta performs the feeding, breathing (oxygen to the fetus), and removes the waste (excretory work) of the fetus.   SCH is the most common abnormality found on an ultrasound done during the first trimester or early second trimester of pregnancy. If there has been little or no vaginal bleeding, early small hematomas usually shrink on their own and do not affect the baby or pregnancy. The blood is gradually absorbed over about 1-2 weeks. When bleeding starts later in pregnancy, is larger or occurs in an older patient, the outcome may not be as good. Larger hematomas may get bigger, which increases the chances for miscarriage. SCH also increases the risk of premature detachment of the placenta from the uterus, preterm (premature) labor and stillbirth.  CAUSES   There is no known cause for SCH. However, it may occur at the time when the fertilized egg implants on the uterus and slightly separates from the uterus.  SYMPTOMS   Most patients with a small subchorionic hematoma in first trimester have no problems. If problems do show up, they may include:   Premature labor.   Painless vaginal bleeding.   Belly (abdominal) or pelvic pain/cramping.  DIAGNOSIS   An ultrasound will likely be performed.  HOME CARE INSTRUCTIONS    Although bed rest will not prevent more bleeding or prevent a miscarriage, your caregiver may recommend bed rest until you are advised otherwise.   Your caregiver may ask you to avoid heavy lifting (more than 10 pounds), exercise, sexual intercourse or douching.   Keep track of the number of pads you use each day and how soaked (saturated) they are. Write down this information.   Do not use tampons.   Your caregiver may ask you to have follow-up blood tests and/or  ultrasound studies.   Your caregiver may recommend Rho gam vaccination if you are Rh negative and the father is Rh positive.  SEEK IMMEDIATE MEDICAL CARE IF:    You experience severe cramps in your stomach, back, abdomen or pelvis.   An unexplained oral temperature above 100 F (37.8 C) develops. Write these down.   You pass large clots or tissue. Save any tissue for your caregiver to look at.   Your bleeding increases or you become light-headed, weak or have fainting episodes.  Document Released: 06/13/2006 Document Revised: 02/15/2011 Document Reviewed: 06/18/2008  [employer] Patient Information 2012 [employer].

## 2011-07-05 NOTE — Progress Notes (Signed)
Pt informed that we are waiting to go to ultrasound

## 2011-07-05 NOTE — MAU Provider Note (Addendum)
History     CSN: [id number]  Arrival date and time: 07/05/11 1547   None     Chief Complaint  Patient presents with  . Vaginal Bleeding   HPI 26 yo G2P1001 at 8.[redacted] weeks EGA by LMP started having light vaginal bleeding that started at 2pm this afternoon with pelvic pain that is described as cramping.  Denies fevers, chills, nausea, vomiting.    OB History    Grav Para Term Preterm Abortions TAB SAB Ect Mult Living   2 1 1  0 0 0 0 0 0 1      Past Medical History  Diagnosis Date  . Herpes genitalia   . HSV (herpes simplex virus) infection     last outbreak in may  . Mitral valve prolapse   . Status post repair of cor triatriatum     Echocardiogram 12/05: EF 55-65%, no mitral valve prolapse, mild RVE and no mention of any residual ASD.      Past Surgical History  Procedure Date  . Cardiac surgery     congenital heart defect repair at 9 mos  . Exploration post operative open heart   . No past surgeries     Family History  Problem Relation Age of Onset  . Diabetes Mother   . Asthma Mother   . Diabetes Father   . Asthma Father     History  Substance Use Topics  . Smoking status: Current Some Day Smoker  . Smokeless tobacco: Not on file  . Alcohol Use: Yes     occasional alcohol    Allergies: No Known Allergies  Prescriptions prior to admission  Medication Sig Dispense Refill  . cetirizine (ZYRTEC) 10 MG tablet Take 10 mg by mouth daily.        . valACYclovir (VALTREX) 500 MG tablet Take 500 mg by mouth daily.       Marland Kitchen DISCONTD: norethindrone-ethinyl estradiol (MICROGESTIN,JUNEL,LOESTRIN) 1-20 MG-MCG tablet Take 1 tablet by mouth daily.          ROS Physical Exam   Blood pressure 114/69, pulse 70, temperature 97.5 F (36.4 C), temperature source Oral, resp. rate 16, height 4\' 10"  (1.473 m), weight 163 lb 4 oz (74.05 kg), last menstrual period 05/08/2011, unknown if currently breastfeeding.  Physical Exam  Constitutional: She appears well-developed and  well-nourished.  GI: Soft. Bowel sounds are normal. She exhibits no distension and no mass. There is tenderness (mild left lower quadrant). There is no rebound and no guarding.  Genitourinary:       Cervical os open.  Trace blood in vagina.  No CMT.  Mild tenderness in left adnexa, no fullness.  Skin: Skin is warm and dry.  Psychiatric: She has a normal mood and affect. Her behavior is normal. Judgment and thought content normal.   Results for orders placed during the hospital encounter of 07/05/11 (from the past 24 hour(s))  URINALYSIS, ROUTINE W REFLEX MICROSCOPIC     Status: Abnormal   Collection Time   07/05/11  4:34 PM      Component Value Range   Color, Urine YELLOW  YELLOW    APPearance CLEAR  CLEAR    Specific Gravity, Urine <1.005 (*) 1.005 - 1.030    pH 7.0  5.0 - 8.0    Glucose, UA NEGATIVE  NEGATIVE (mg/dL)   Hgb urine dipstick NEGATIVE  NEGATIVE    Bilirubin Urine NEGATIVE  NEGATIVE    Ketones, ur NEGATIVE  NEGATIVE (mg/dL)   Protein, ur NEGATIVE  NEGATIVE (mg/dL)   Urobilinogen, UA 0.2  0.0 - 1.0 (mg/dL)   Nitrite NEGATIVE  NEGATIVE    Leukocytes, UA NEGATIVE  NEGATIVE   POCT PREGNANCY, URINE     Status: Abnormal   Collection Time   07/05/11  4:58 PM      Component Value Range   Preg Test, Ur POSITIVE (*) NEGATIVE   HCG, QUANTITATIVE, PREGNANCY     Status: Abnormal   Collection Time   07/05/11  6:00 PM      Component Value Range   hCG, Beta Chain, Quant, S 13613 (*) <5 (mIU/mL)   Imaging:  MAU Course  Procedures   Assessment and Plan  Patient care turned over to [facility], CNM.  Candelaria Celeste JEHIEL 07/05/2011, 6:15 PM   Assessment: IUP [redacted]w[redacted]d by U/S on 07/05/11 Abdominal pain in early pregnancy Vaginal bleeding in first trimester with small subchorionic hemorrhage  Plan: D/C home with bleeding precautions Begin prenatal care as soon as possible Return to MAU as needed  LEFTWICH-KIRBY, Chinwe Lope Certified Nurse-Midwife

## 2011-07-05 NOTE — Progress Notes (Signed)
Pt informed that we are waiting for an  Ultrasound and she is next

## 2011-07-06 ENCOUNTER — Inpatient Hospital Stay (HOSPITAL_COMMUNITY)
Admission: AD | Admit: 2011-07-06 | Discharge: 2011-07-06 | Disposition: A | Discharge status: Home / Self Care | Payer: Medicaid Other | Source: Ambulatory Visit | Attending: Obstetrics & Gynecology | Admitting: Obstetrics & Gynecology

## 2011-07-06 ENCOUNTER — Encounter (HOSPITAL_COMMUNITY): Payer: Self-pay

## 2011-07-06 DIAGNOSIS — O418X90 Other specified disorders of amniotic fluid and membranes, unspecified trimester, not applicable or unspecified: Secondary | ICD-10-CM

## 2011-07-06 DIAGNOSIS — O209 Hemorrhage in early pregnancy, unspecified: Secondary | ICD-10-CM | POA: Insufficient documentation

## 2011-07-06 DIAGNOSIS — O468X9 Other antepartum hemorrhage, unspecified trimester: Secondary | ICD-10-CM

## 2011-07-06 NOTE — MAU Note (Signed)
Patient state she was seen in MAU last night for spotting. Had some dark spotting this am and having abdominal cramping. Pad patient is wearing has a scant amount of pink discharge.

## 2011-07-06 NOTE — Discharge Instructions (Signed)
Pelvic Rest Pelvic rest is sometimes recommended for women when:   The placenta is partially or completely covering the opening of the cervix (placenta previa).   There is bleeding between the uterine wall and the amniotic sac in the first trimester (subchorionic hemorrhage).   The cervix begins to open without labor starting (incompetent cervix, cervical insufficiency).   The labor is too early (preterm labor).  HOME CARE INSTRUCTIONS  Do not have sexual intercourse, stimulation, or an orgasm.   Do not use tampons, douche, or put anything in the vagina.   Do not lift anything over 10 pounds (4.5 kg).   Avoid strenuous activity or straining your pelvic muscles.  SEEK MEDICAL CARE IF:  You have any vaginal bleeding during pregnancy. Treat this as a potential emergency.   You have cramping pain felt low in the stomach (stronger than menstrual cramps).   You notice vaginal discharge (watery, mucus, or bloody).   You have a low, dull backache.   There are regular contractions or uterine tightening.  SEEK IMMEDIATE MEDICAL CARE IF: You have vaginal bleeding and have placenta previa.  Document Released: 06/23/2010 Document Revised: 02/15/2011 Document Reviewed: 06/23/2010 ExitCare Patient Information 2012 [employer].Vaginal Bleeding During Pregnancy A small amount of bleeding from the vagina can happen anytime during pregnancy. Be sure to tell your doctor about all vaginal bleeding.  HOME CARE  Get plenty of rest and sleep.   Count the number of pads you use each day. Do not use tampons.   Save any tissue you pass for your doctor to see.   Do not exercise   Do not do any heavy lifting.   Avoid going up and down stairs. If you must climb stairs, go slowly.   Do not have sex (intercourse) or orgasms until approved by your doctor.   Do not douche.   Only take medicine as told by your doctor. Do not take aspirin.   Eat healthy.   Always keep your follow-up  appointments.  GET HELP RIGHT AWAY IF:   You feel the baby moving less or not moving at all.   The bleeding gets worse.   You have very painful cramps or pain in your stomach or back.   You pass large clots or anything that looks like tissue.   You have a temperature by mouth above 102 F (38.9 C).   You feel very weak.   You have chills.   You feel dizzy or pass out (faint).   You have a gush of fluid from the vagina.  MAKE SURE YOU:   Understand these instructions.   Will watch your condition.   Will get help right away if you are not doing well or get worse.  Document Released: 12/06/2007 Document Revised: 02/15/2011 Document Reviewed: 02/01/2009 ExitCare Patient Information 2012 [employer]. 

## 2011-07-06 NOTE — MAU Provider Note (Signed)
Medical Screening exam and patient care preformed by advanced practice provider.  Agree with the above management.  

## 2011-07-06 NOTE — MAU Provider Note (Signed)
  History     CSN: [id number]  Arrival date and time: 07/06/11 1256   First Provider Initiated Contact with Patient 07/06/11 1529      Chief Complaint  Patient presents with  . Vaginal Bleeding  . Abdominal Cramping   HPIElizabeth Juarez is 26 y.o. G2P1001 [redacted]w[redacted]d weeks presenting with increased vaginal bleeding, dark red and brown.  She was seen here yesterday for same reason, ultrasound showed living IUP with subchorionic hemorrhage.  Reports abdominal cramping and low back pain.  Hx of low back pain.  Mild headache.  Denies fever.     Past Medical History  Diagnosis Date  . Herpes genitalia   . HSV (herpes simplex virus) infection     last outbreak in may  . Mitral valve prolapse   . Status post repair of cor triatriatum     Echocardiogram 12/05: EF 55-65%, no mitral valve prolapse, mild RVE and no mention of any residual ASD.      Past Surgical History  Procedure Date  . Cardiac surgery     congenital heart defect repair at 9 mos  . Exploration post operative open heart   . No past surgeries     Family History  Problem Relation Age of Onset  . Diabetes Mother   . Asthma Mother   . Diabetes Father   . Asthma Father     History  Substance Use Topics  . Smoking status: Current Some Day Smoker  . Smokeless tobacco: Never Used  . Alcohol Use: Yes     occasional alcohol    Allergies: No Known Allergies  Prescriptions prior to admission  Medication Sig Dispense Refill  . cetirizine (ZYRTEC) 10 MG tablet Take 10 mg by mouth daily.        . valACYclovir (VALTREX) 500 MG tablet Take 500 mg by mouth daily.         Review of Systems  Gastrointestinal: Positive for abdominal pain (cramping).  Genitourinary:       + for vaginal bleeding  Psychiatric/Behavioral: The patient is nervous/anxious.    Physical Exam   Blood pressure 108/62, pulse 74, temperature 97.4 F (36.3 C), temperature source Oral, resp. rate 20, height 4\' 10"  (1.473 m), weight 72.666 kg (160 lb  3.2 oz), last menstrual period 05/08/2011, SpO2 100.00%, unknown if currently breastfeeding.  Physical Exam  Constitutional: She is oriented to person, place, and time. She appears well-developed and well-nourished.  HENT:  Head: Normocephalic.  Neck: Normal range of motion.  Cardiovascular: Normal rate.   Respiratory: Effort normal.  GI: Soft. There is no tenderness.  Genitourinary: There is bleeding (small amount of reddish-brown bleeding without clot) around the vagina.       Cervix closed  Neurological: She is alert and oriented to person, place, and time.  Skin: Skin is warm and dry.  Psychiatric: She has a normal mood and affect. Her behavior is normal.    MAU Course  Procedures  MDM   Assessment and Plan  A: vaginal bleeding in early pregnancy     Known subchorionic hemorrhage noted on ultrasound yesterday    Anxiety over bleeding  P:  Reassured patient.  Explained [facility].  Encourage to make appointment with [facility] for prenatal care when insurance in place       Instructed to adhere to pelvic rest until bleeding stops     Return to MAU for worsening sxs      Tamari Busic,EVE M 07/06/2011, 3:40 PM

## 2011-07-12 ENCOUNTER — Encounter (HOSPITAL_COMMUNITY): Payer: Self-pay | Admitting: *Deleted

## 2011-07-12 ENCOUNTER — Inpatient Hospital Stay (HOSPITAL_COMMUNITY)
Admission: AD | Admit: 2011-07-12 | Discharge: 2011-07-12 | Disposition: A | Discharge status: Home / Self Care | Payer: Medicaid Other | Source: Ambulatory Visit | Attending: Obstetrics and Gynecology | Admitting: Obstetrics and Gynecology

## 2011-07-12 DIAGNOSIS — O209 Hemorrhage in early pregnancy, unspecified: Secondary | ICD-10-CM | POA: Insufficient documentation

## 2011-07-12 NOTE — MAU Note (Signed)
Vaginal bleeding for the last few days got worst around 8pm. Cramping started last night around 11pm.

## 2011-07-12 NOTE — Discharge Instructions (Signed)
Pelvic Rest Pelvic rest is sometimes recommended for women when:   The placenta is partially or completely covering the opening of the cervix (placenta previa).   There is bleeding between the uterine wall and the amniotic sac in the first trimester (subchorionic hemorrhage).   The cervix begins to open without labor starting (incompetent cervix, cervical insufficiency).   The labor is too early (preterm labor).  HOME CARE INSTRUCTIONS  Do not have sexual intercourse, stimulation, or an orgasm.   Do not use tampons, douche, or put anything in the vagina.   Do not lift anything over 10 pounds (4.5 kg).   Avoid strenuous activity or straining your pelvic muscles.  SEEK MEDICAL CARE IF:  You have any vaginal bleeding during pregnancy. Treat this as a potential emergency.   You have cramping pain felt low in the stomach (stronger than menstrual cramps).   You notice vaginal discharge (watery, mucus, or bloody).   You have a low, dull backache.   There are regular contractions or uterine tightening.  SEEK IMMEDIATE MEDICAL CARE IF: You have vaginal bleeding and have placenta previa.  Document Released: 06/23/2010 Document Revised: 02/15/2011 Document Reviewed: 06/23/2010 ExitCare Patient Information 2012 [employer].Vaginal Bleeding During Pregnancy, First Trimester A small amount of bleeding (spotting) is relatively common in early pregnancy. It usually stops on its own. There are many causes for bleeding or spotting in early pregnancy. Some bleeding may be related to the pregnancy and some may not. Cramping with the bleeding is more serious and concerning. Tell your caregiver if you have any vaginal bleeding.  CAUSES   It is normal in most cases.   The pregnancy ends (miscarriage).   The pregnancy may end (threatened miscarriage).   Infection or inflammation of the cervix.   Growths (polyps) on the cervix.   Pregnancy happens outside of the uterus and in a fallopian  tube (tubal pregnancy).   Many tiny cysts in the uterus instead of pregnancy tissue (molar pregnancy).  SYMPTOMS  Vaginal bleeding or spotting with or without cramps. DIAGNOSIS  To evaluate the pregnancy, your caregiver may:  Do a pelvic exam.   Take blood tests.   Do an ultrasound.  It is very important to follow your caregiver's instructions.  TREATMENT   Evaluation of the pregnancy with blood tests and ultrasound.   Bed rest (getting up to use the bathroom only).   Rho-gam immunization if the mother is Rh negative and the father is Rh positive.  HOME CARE INSTRUCTIONS   If your caregiver orders bed rest, you may need to make arrangements for the care of other children and for other responsibilities. However, your caregiver may allow you to continue light activity.   Keep track of the number of pads you use each day, how often you change pads and how soaked (saturated) they are. Write this down.   Do not use tampons. Do not douche.   Do not have sexual intercourse or orgasms until approved by your physician.   Save any tissue that you pass for your caregiver to see.   Take medicine for cramps only with your caregiver's permission.   Do not take aspirin because it can make you bleed.  SEEK IMMEDIATE MEDICAL CARE IF:   You experience severe cramps in your stomach, back or belly (abdomen).   You have an oral temperature above 102 F (38.9 C), not controlled by medicine.   You pass large clots or tissue.   Your bleeding increases or you become   light-headed, weak or have fainting episodes.   You develop chills.   You are leaking or have a gush of fluid from your vagina.   You pass out while having a bowel movement. That may mean you have a ruptured tubal pregnancy.  Document Released: 12/06/2004 Document Revised: 02/15/2011 Document Reviewed: 06/17/2008 ExitCare Patient Information 2012 [employer]. 

## 2011-07-12 NOTE — MAU Provider Note (Signed)
[name] y.o.G2P1001 @[redacted]w[redacted]d  by LMP Chief Complaint  Patient presents with  . Vaginal Bleeding     First Provider Initiated Contact with Patient 07/12/11 0144      SUBJECTIVE  HPI: Presents with her third visit for early pregnancy bleeding. She is concerned that the bleeding waxes and wanes and this evening has been heavier than usual. Ultrasound 07/05/2011 showed embryo with fetal heart rate 106 and subchorionic hemorrhage. She denies cramping or passage of tissue appeared  Past Medical History  Diagnosis Date  . Herpes genitalia   . HSV (herpes simplex virus) infection     last outbreak in may  . Mitral valve prolapse   . Status post repair of cor triatriatum     Echocardiogram 12/05: EF 55-65%, no mitral valve prolapse, mild RVE and no mention of any residual ASD.     Past Surgical History  Procedure Date  . Cardiac surgery     congenital heart defect repair at 9 mos  . Exploration post operative open heart   . No past surgeries    History   Social History  . Marital Status: Married    Spouse Name: N/A    Number of Children: N/A  . Years of Education: N/A   Occupational History  . Not on file.   Social History Main Topics  . Smoking status: Former Games developer  . Smokeless tobacco: Never Used  . Alcohol Use: Yes     occasional alcohol  . Drug Use: No  . Sexually Active: Yes   Other Topics Concern  . Not on file   Social History Narrative  . No narrative on file   No current facility-administered medications on file prior to encounter.   Current Outpatient Prescriptions on File Prior to Encounter  Medication Sig Dispense Refill  . cetirizine (ZYRTEC) 10 MG tablet Take 10 mg by mouth daily. For allergies      . valACYclovir (VALTREX) 500 MG tablet Take 500 mg by mouth daily.        No Known Allergies  ROS: Pertinent items in HPI  OBJECTIVE Blood pressure 109/70, pulse 88, temperature 98.7 F (37.1 C), temperature source Oral, resp. rate 18,  height 4\' 10"  (1.473 m), weight 73.029 kg (161 lb), last menstrual period 05/08/2011, unknown if currently breastfeeding.  GENERAL: Well-developed, well-nourished female in no acute distress but anxious ABDOMEN: Soft, nontender EXTREMITIES: Nontender, no edema NEURO: Alert and oriented VE:  Ext 1 int closed, cx long, mod dark blood, small clots, no tissue    LAB RESULTS    IMAGING Bedside US by me: pos fetal cardiac activity  ASSESSMENT G2P1001 at [redacted]w[redacted]d Early pregnancy bleeding, SCH Viable IUP   PLAN Reassured. Bleeding precautions. Pelvic rest and work excuse for tomorrow.     [name] 07/12/2011 2:27 AM

## 2011-08-06 NOTE — MAU Provider Note (Signed)
Attestation of Attending Supervision of Advanced Practitioner: Evaluation and management procedures were performed by the PA/NP/CNM/OB Fellow under my supervision/collaboration. Chart reviewed and agree with management and plan.  Marielis Samara V 08/06/2011 8:45 PM

## 2011-08-20 ENCOUNTER — Ambulatory Visit (HOSPITAL_COMMUNITY)
Admission: RE | Admit: 2011-08-20 | Discharge: 2011-08-20 | Disposition: A | Discharge status: Home / Self Care | Payer: Medicaid Other | Source: Ambulatory Visit | Attending: Obstetrics and Gynecology | Admitting: Obstetrics and Gynecology

## 2011-08-20 ENCOUNTER — Other Ambulatory Visit (HOSPITAL_COMMUNITY): Payer: Self-pay | Admitting: Obstetrics and Gynecology

## 2011-08-20 DIAGNOSIS — O3680X0 Pregnancy with inconclusive fetal viability, not applicable or unspecified: Secondary | ICD-10-CM | POA: Insufficient documentation

## 2011-08-20 DIAGNOSIS — O209 Hemorrhage in early pregnancy, unspecified: Secondary | ICD-10-CM | POA: Insufficient documentation

## 2011-08-20 DIAGNOSIS — O368390 Maternal care for abnormalities of the fetal heart rate or rhythm, unspecified trimester, not applicable or unspecified: Secondary | ICD-10-CM | POA: Insufficient documentation

## 2011-09-03 ENCOUNTER — Emergency Department (HOSPITAL_COMMUNITY)
Admission: EM | Admit: 2011-09-03 | Discharge: 2011-09-03 | Disposition: A | Discharge status: Home / Self Care | Payer: Medicaid Other | Attending: Emergency Medicine | Admitting: Emergency Medicine

## 2011-09-03 ENCOUNTER — Emergency Department (HOSPITAL_COMMUNITY): Payer: Medicaid Other

## 2011-09-03 ENCOUNTER — Encounter (HOSPITAL_COMMUNITY): Payer: Self-pay | Admitting: Emergency Medicine

## 2011-09-03 DIAGNOSIS — Z3490 Encounter for supervision of normal pregnancy, unspecified, unspecified trimester: Secondary | ICD-10-CM

## 2011-09-03 DIAGNOSIS — N949 Unspecified condition associated with female genital organs and menstrual cycle: Secondary | ICD-10-CM | POA: Insufficient documentation

## 2011-09-03 DIAGNOSIS — R109 Unspecified abdominal pain: Secondary | ICD-10-CM | POA: Insufficient documentation

## 2011-09-03 DIAGNOSIS — N390 Urinary tract infection, site not specified: Secondary | ICD-10-CM

## 2011-09-03 DIAGNOSIS — Z331 Pregnant state, incidental: Secondary | ICD-10-CM | POA: Insufficient documentation

## 2011-09-03 LAB — URINE MICROSCOPIC-ADD ON

## 2011-09-03 LAB — CBC
MCV: 86.8 fL (ref 78.0–100.0)
Platelets: 343 10*3/uL (ref 150–400)
RDW: 13.1 % (ref 11.5–15.5)
WBC: 13.1 10*3/uL — ABNORMAL HIGH (ref 4.0–10.5)

## 2011-09-03 LAB — DIFFERENTIAL
Blasts: 0 %
Metamyelocytes Relative: 0 %
Monocytes Absolute: 0.7 10*3/uL (ref 0.1–1.0)
Monocytes Relative: 5 % (ref 3–12)
Myelocytes: 0 %
Neutrophils Relative %: 67 % (ref 43–77)
nRBC: 0 /100 WBC

## 2011-09-03 LAB — BASIC METABOLIC PANEL
BUN: 14 mg/dL (ref 6–23)
Chloride: 102 mEq/L (ref 96–112)
Glucose, Bld: 103 mg/dL — ABNORMAL HIGH (ref 70–99)
Potassium: 3.6 mEq/L (ref 3.5–5.1)

## 2011-09-03 LAB — URINALYSIS, ROUTINE W REFLEX MICROSCOPIC
Bilirubin Urine: NEGATIVE
Glucose, UA: NEGATIVE mg/dL
Nitrite: NEGATIVE
Specific Gravity, Urine: 1.019 (ref 1.005–1.030)
pH: 6 (ref 5.0–8.0)

## 2011-09-03 MED ORDER — NITROFURANTOIN MONOHYD MACRO 100 MG PO CAPS
100.0000 mg | ORAL_CAPSULE | Freq: Once | ORAL | Status: AC
  Administered 2011-09-03: 100 mg via ORAL
  Filled 2011-09-03: qty 1

## 2011-09-03 MED ORDER — ONDANSETRON 4 MG PO TBDP
4.0000 mg | ORAL_TABLET | Freq: Once | ORAL | Status: AC
  Administered 2011-09-03: 4 mg via ORAL
  Filled 2011-09-03: qty 1

## 2011-09-03 MED ORDER — CEPHALEXIN 500 MG PO CAPS: 500.0000 mg | ORAL_CAPSULE | Freq: Four times a day (QID) | ORAL | Status: AC

## 2011-09-03 NOTE — ED Provider Notes (Signed)
History     CSN: [id number]  Arrival date & time 09/03/11  0112   First MD Initiated Contact with Patient 09/03/11 [phone]      Chief Complaint  Patient presents with  . Abdominal Pain    taking microbid per PMD without relief  . Vaginal Pain    (Consider location/radiation/quality/duration/timing/severity/associated sxs/prior treatment) HPI Comments: 3 days of suprapubic abdominal pain, burning with urination, vaginal pain.  Had symptoms intermittently 1 week ago and completed 5 days of macrobid.  No fever, vomiting, back pain, chest pain, SOB.  Had miscarriage in early May and has not yet had an ultrasound with this pregnancy. No vaginal bleeding or discharge.  The history is provided by the patient.    Past Medical History  Diagnosis Date  . Herpes genitalia   . HSV (herpes simplex virus) infection     last outbreak in may  . Mitral valve prolapse   . Status post repair of cor triatriatum     Echocardiogram 12/05: EF 55-65%, no mitral valve prolapse, mild RVE and no mention of any residual ASD.      Past Surgical History  Procedure Date  . Cardiac surgery     congenital heart defect repair at 9 mos  . Exploration post operative open heart   . No past surgeries     Family History  Problem Relation Age of Onset  . Diabetes Mother   . Asthma Mother   . Diabetes Father   . Asthma Father     History  Substance Use Topics  . Smoking status: Former Games developer  . Smokeless tobacco: Never Used  . Alcohol Use: Yes     occasional alcohol    OB History    Grav Para Term Preterm Abortions TAB SAB Ect Mult Living   2 1 1  0 0 0 0 0 0 1      Review of Systems  Constitutional: Negative for fever, activity change and appetite change.  HENT: Negative for congestion and rhinorrhea.   Respiratory: Negative for cough, chest tightness and shortness of breath.   Cardiovascular: Negative for chest pain.  Gastrointestinal: Positive for abdominal pain. Negative for nausea and  vomiting.  Genitourinary: Positive for urgency, frequency, vaginal pain and pelvic pain. Negative for vaginal discharge.  Musculoskeletal: Negative for back pain.  Skin: Negative for rash.  Neurological: Negative for dizziness and headaches.    Allergies  Review of patient's allergies indicates no known allergies.  Home Medications   Current Outpatient Rx  Name Route Sig Dispense Refill  . ACETAMINOPHEN 325 MG PO TABS Oral Take 650 mg by mouth every 6 (six) hours as needed. For cramps    . CETIRIZINE HCL 10 MG PO TABS Oral Take 10 mg by mouth daily. For allergies    . PRENATAL MULTIVITAMIN CH Oral Take 1 tablet by mouth daily.    Marland Kitchen VALACYCLOVIR HCL 500 MG PO TABS Oral Take 500 mg by mouth daily.     . CEPHALEXIN 500 MG PO CAPS Oral Take 1 capsule (500 mg total) by mouth 4 (four) times daily. 40 capsule 0  . NITROFURANTOIN MONOHYD MACRO 100 MG PO CAPS Oral Take 100 mg by mouth 2 (two) times daily.      BP 137/82  Temp 98.6 F (37 C) (Oral)  Resp 18  SpO2 100%  LMP 05/08/2011  Breastfeeding? Unknown  Physical Exam  Constitutional: She is oriented to person, place, and time. She appears well-developed. No distress.  HENT:  Head:  Normocephalic and atraumatic.  Mouth/Throat: Oropharynx is clear and moist. No oropharyngeal exudate.  Eyes: Conjunctivae are normal. Pupils are equal, round, and reactive to light.  Neck: Normal range of motion. Neck supple.  Cardiovascular: Normal rate, regular rhythm and normal heart sounds.   No murmur heard. Pulmonary/Chest: Effort normal and breath sounds normal. No respiratory distress.  Abdominal: Soft. There is tenderness. There is no rebound and no guarding.       Suprapubic, RLQ pain, no guarding or rebound  Genitourinary: Cervix exhibits no motion tenderness and no discharge. Right adnexum displays tenderness. Left adnexum displays no mass and no tenderness. No vaginal discharge found.  Musculoskeletal: Normal range of motion. She exhibits  no edema and no tenderness.       No CVAT  Neurological: She is alert and oriented to person, place, and time. No cranial nerve deficit.  Skin: Skin is warm.    ED Course  Procedures (including critical care time)  Labs Reviewed  URINALYSIS, ROUTINE W REFLEX MICROSCOPIC - Abnormal; Notable for the following:    APPearance CLOUDY (*)     Hgb urine dipstick SMALL (*)     Leukocytes, UA LARGE (*)     All other components within normal limits  POCT PREGNANCY, URINE - Abnormal; Notable for the following:    Preg Test, Ur POSITIVE (*)     All other components within normal limits  URINE MICROSCOPIC-ADD ON - Abnormal; Notable for the following:    Squamous Epithelial / LPF FEW (*)     Bacteria, UA FEW (*)     All other components within normal limits  CBC - Abnormal; Notable for the following:    WBC 13.1 (*)     All other components within normal limits  DIFFERENTIAL - Abnormal; Notable for the following:    Neutro Abs 9.0 (*)     All other components within normal limits  BASIC METABOLIC PANEL - Abnormal; Notable for the following:    Glucose, Bld 103 (*)     All other components within normal limits  HCG, QUANTITATIVE, PREGNANCY - Abnormal; Notable for the following:    hCG, Beta Chain, Quant, S 1778 (*)     All other components within normal limits  WET PREP, GENITAL - Abnormal; Notable for the following:    WBC, Wet Prep HPF POC TOO NUMEROUS TO COUNT (*)     All other components within normal limits  ABO/RH  GC/CHLAMYDIA PROBE AMP, GENITAL  URINE CULTURE   US Ob Comp Less 14 Wks  09/03/2011  *RADIOLOGY REPORT*  Clinical Data: Abdominal and vaginal pain.  Unsure of dates. Quantitative beta HCG is 1778  OBSTETRIC <14 WK [place] AND TRANSVAGINAL OB US  Technique:  Both transabdominal and transvaginal ultrasound examinations were performed for complete evaluation of the gestation as well as the maternal uterus, adnexal regions, and pelvic cul-de-sac.  Transvaginal technique was  performed to assess early pregnancy.  Comparison:  08/20/2011  Intrauterine gestational sac:  The endometrium is thickened, echogenic, and expanded.  There is a single intrauterine endometrial cystic structure consistent with early gestational sac. Small subchorionic hemorrhage is present.  This measures about 7.2 x 7.9 x 9 mm. Yolk sac: Not visualized,. Embryo: Not visualized. Cardiac Activity: Not demonstrated. Heart Rate: N/A bpm  MSD: 5.9  mm  5    w 2    d   [place] EDC: 05/03/2012  Maternal uterus/adnexae: The uterus is mildly anteverted.  No myometrial masses.  The right ovary  measures 1.9 x 1.5 x 2.8 cm.  Normal follicular changes.  The left ovary measures 1.7 x 2.6 x 1.6 cm.  Normal follicular changes. No free pelvic fluid collections.  IMPRESSION: Small intrauterine gestational sac is demonstrated.  Estimated gestational age by mean sac diameter is 5 weeks 2 days.  Fetal pole and yolk sac are not visualized, likely due to early gestational age.  There is a small subchorionic hemorrhage.  Recommend follow- up with serial quantitative beta HCG and / or short-term ultrasound followup in 1-2 weeks as clinically indicated.  Original Report Authenticated By: Marlon Pel, M.D.   US Ob Transvaginal  09/03/2011  *RADIOLOGY REPORT*  Clinical Data: Abdominal and vaginal pain.  Unsure of dates. Quantitative beta HCG is 1778  OBSTETRIC <14 WK [place] AND TRANSVAGINAL OB US  Technique:  Both transabdominal and transvaginal ultrasound examinations were performed for complete evaluation of the gestation as well as the maternal uterus, adnexal regions, and pelvic cul-de-sac.  Transvaginal technique was performed to assess early pregnancy.  Comparison:  08/20/2011  Intrauterine gestational sac:  The endometrium is thickened, echogenic, and expanded.  There is a single intrauterine endometrial cystic structure consistent with early gestational sac. Small subchorionic hemorrhage is present.  This measures about 7.2 x 7.9 x 9  mm. Yolk sac: Not visualized,. Embryo: Not visualized. Cardiac Activity: Not demonstrated. Heart Rate: N/A bpm  MSD: 5.9  mm  5    w 2    d   [place] EDC: 05/03/2012  Maternal uterus/adnexae: The uterus is mildly anteverted.  No myometrial masses.  The right ovary measures 1.9 x 1.5 x 2.8 cm.  Normal follicular changes.  The left ovary measures 1.7 x 2.6 x 1.6 cm.  Normal follicular changes. No free pelvic fluid collections.  IMPRESSION: Small intrauterine gestational sac is demonstrated.  Estimated gestational age by mean sac diameter is 5 weeks 2 days.  Fetal pole and yolk sac are not visualized, likely due to early gestational age.  There is a small subchorionic hemorrhage.  Recommend follow- up with serial quantitative beta HCG and / or short-term ultrasound followup in 1-2 weeks as clinically indicated.  Original Report Authenticated By: Marlon Pel, M.D.     1. Urinary tract infection   2. Pregnancy       MDM  Early pregnancy with urinary symptoms and lower abdominal pain. Recently treated with macrobid.  No vaginal bleeding or discharge.  NO [place] with this pregnancy.  +UA. Culture sent. Will start on keflex. Pelvic with suprapubic and R adnexal pain. No pain at [facility] point, no vomiting, no peritoneal signs. Doubt appendicitis.  [place] with IUP gestational sac, fetal pole not seen. Ovaries normal.  Patient instructed repeat ultrasound and HCG needed in 1 week.  Keflex for UTI.  Return precautions discussed.        Glynn Octave, MD 09/03/11 [phone]

## 2011-09-03 NOTE — Discharge Instructions (Signed)
Urinary Tract Infection Your ultrasound today did not show a baby. It is probably too early to see, but you need a repeat ultrasound in 1 week to see.  Return to the ED if you develop new or worsening symptoms. Infections of the urinary tract can start in several places. A bladder infection (cystitis), a kidney infection (pyelonephritis), and a prostate infection (prostatitis) are different types of urinary tract infections (UTIs). They usually get better if treated with medicines (antibiotics) that kill germs. Take all the medicine until it is gone. You or your child may feel better in a few days, but TAKE ALL MEDICINE or the infection may not respond and may become more difficult to treat. HOME CARE INSTRUCTIONS   Drink enough water and fluids to keep the urine clear or pale yellow. Cranberry juice is especially recommended, in addition to large amounts of water.   Avoid caffeine, tea, and carbonated beverages. They tend to irritate the bladder.   Alcohol may irritate the prostate.   Only take over-the-counter or prescription medicines for pain, discomfort, or fever as directed by your caregiver.  To prevent further infections:  Empty the bladder often. Avoid holding urine for long periods of time.   After a bowel movement, women should cleanse from front to back. Use each tissue only once.   Empty the bladder before and after sexual intercourse.  FINDING OUT THE RESULTS OF YOUR TEST Not all test results are available during your visit. If your or your child's test results are not back during the visit, make an appointment with your caregiver to find out the results. Do not assume everything is normal if you have not heard from your caregiver or the medical facility. It is important for you to follow up on all test results. SEEK MEDICAL CARE IF:   There is back pain.   Your baby is older than 3 months with a rectal temperature of 100.5 F (38.1 C) or higher for more than 1 day.   Your  or your child's problems (symptoms) are no better in 3 days. Return sooner if you or your child is getting worse.  SEEK IMMEDIATE MEDICAL CARE IF:   There is severe back pain or lower abdominal pain.   You or your child develops chills.   You have a fever.   Your baby is older than 3 months with a rectal temperature of 102 F (38.9 C) or higher.   Your baby is 31 months old or younger with a rectal temperature of 100.4 F (38 C) or higher.   There is nausea or vomiting.   There is continued burning or discomfort with urination.  MAKE SURE YOU:   Understand these instructions.   Will watch your condition.   Will get help right away if you are not doing well or get worse.  Document Released: 12/06/2004 Document Revised: 02/15/2011 Document Reviewed: 07/11/2006 [facility] Patient Information 2012 [place], [place]. Pregnancy If you are planning on getting pregnant, it is a good idea to make a preconception appointment with your care- giver to discuss having a healthy lifestyle before getting pregnant. Such as, diet, weight, exercise, taking prenatal vitamins especially folic acid (it helps prevent brain and spinal cord defects), avoiding alcohol, smoking and illegal drugs, medical problems (diabetes, convulsions), family history of genetic problems, working conditions and immunizations. It is better to have knowledge of these things and do something about them before getting pregnant. In your pregnancy, it is important to follow certain guidelines to have  a healthy baby. It is very important to get good prenatal care and follow your caregiver's instructions. Prenatal care includes all the medical care you receive before your baby's birth. This helps to prevent problems during the pregnancy and childbirth. HOME CARE INSTRUCTIONS   Start your prenatal visits by the 12th week of pregnancy or before when possible. They are usually scheduled monthly at first. They are more often in the last 2  months before delivery. It is important that you keep your caregiver's appointments and follow your caregiver's instructions regarding medication use, exercise, and diet.   During pregnancy, you are providing food for you and your baby. Eat a regular, well-balanced diet. Choose foods such as meat, fish, milk and other dairy products, vegetables, fruits, whole-grain breads and cereals. Your caregiver will inform you of the ideal weight gain depending on your current height and weight. Drink lots of liquids. Try to drink 8 glasses of water a day.   Alcohol is associated with a number of birth defects including fetal alcohol syndrome. It is best to avoid alcohol completely. Smoking will cause low birth rate and prematurity. Use of alcohol and nicotine during your pregnancy also increases the chances that your child will be chemically dependent later in their life and may contribute to SIDS (Sudden Infant Death Syndrome).   Do not use illegal drugs.   Only take prescription or over-the-counter medications that are recommended by your caregiver. Other medications can cause genetic and physical problems in the baby.   Morning sickness can often be helped by keeping soda crackers at the bedside. Eat a couple before arising in the morning.   A sexual relationship may be continued until near the end of pregnancy if there are no other problems such as early (premature) leaking of amniotic fluid from the membranes, vaginal bleeding, painful intercourse or belly (abdominal) pain.   Exercise regularly. Check with your caregiver if you are unsure of the safety of some of your exercises.   Do not use hot tubs, steam rooms or saunas. These increase the risk of fainting or passing out and hurting yourself and the baby. Swimming is OK for exercise. Get plenty of rest, including afternoon naps when possible especially in the third trimester.   Avoid toxic odors and chemicals.   Do not wear high heels. They may  cause you to lose your balance and fall.   Do not lift over 5 pounds. If you do lift anything, lift with your legs and thighs, not your back.   Avoid long trips, especially in the third trimester.   If you have to travel out of the city or state, take a copy of your medical records with you.  SEEK IMMEDIATE MEDICAL CARE IF:   You develop an unexplained oral temperature above 102 F (38.9 C), or as your caregiver suggests.   You have leaking of fluid from the vagina. If leaking membranes are suspected, take your temperature and inform your caregiver of this when you call.   There is vaginal spotting or bleeding. Notify your caregiver of the amount and how many pads are used.   You continue to feel sick to your stomach (nauseous) and have no relief from remedies suggested, or you throw up (vomit) blood or coffee ground like materials.   You develop upper abdominal pain.   You have round ligament discomfort in the lower abdominal area. This still must be evaluated by your caregiver.   You feel contractions of the uterus.  You do not feel the baby move, or there is less movement than before.   You have painful urination.   You have abnormal vaginal discharge.   You have persistent diarrhea.   You get a severe headache.   You have problems with your vision.   You develop muscle weakness.   You feel dizzy and faint.   You develop shortness of breath.   You develop chest pain.   You have back pain that travels down to your leg and feet.   You feel irregular or a very fast heartbeat.   You develop excessive weight gain in a short period of time (5 pounds in 3 to 5 days).   You are involved with a domestic violence situation.  Document Released: 02/26/2005 Document Revised: 02/15/2011 Document Reviewed: 08/20/2008 [facility] Patient Information 2012 [place], [place].

## 2011-09-03 NOTE — ED Notes (Signed)
Pt reports pain and frequency with urination as well as abd and vaginal pain denies discharge Rx Macrobid bid  x5 days given per PMD with out relief

## 2011-09-03 NOTE — ED Notes (Signed)
TRANSPORTED TO ULTRASOUND

## 2011-09-03 NOTE — ED Notes (Signed)
DR . Manus Gunning PERFORMING PELVIC EXAM WITH [facility]ADY EMT TECHNICIAN CHAPERONE.

## 2011-09-04 LAB — URINE CULTURE: Colony Count: >=100000

## 2011-09-04 LAB — GC/CHLAMYDIA PROBE AMP, GENITAL
Chlamydia, DNA Probe: NEGATIVE
GC Probe Amp, Genital: NEGATIVE

## 2011-09-05 NOTE — ED Notes (Signed)
+   Urine Patient treated with Keflex-sensitive to same-chart appended per protocol MD. 

## 2011-09-14 ENCOUNTER — Inpatient Hospital Stay (HOSPITAL_COMMUNITY)
Admission: AD | Admit: 2011-09-14 | Discharge: 2011-09-15 | Disposition: A | Discharge status: Home / Self Care | Payer: Medicaid Other | Source: Ambulatory Visit | Attending: Obstetrics and Gynecology | Admitting: Obstetrics and Gynecology

## 2011-09-14 ENCOUNTER — Encounter (HOSPITAL_COMMUNITY): Payer: Self-pay | Admitting: *Deleted

## 2011-09-14 ENCOUNTER — Inpatient Hospital Stay (HOSPITAL_COMMUNITY): Payer: Medicaid Other

## 2011-09-14 DIAGNOSIS — R109 Unspecified abdominal pain: Secondary | ICD-10-CM | POA: Insufficient documentation

## 2011-09-14 DIAGNOSIS — O200 Threatened abortion: Secondary | ICD-10-CM

## 2011-09-14 DIAGNOSIS — O039 Complete or unspecified spontaneous abortion without complication: Secondary | ICD-10-CM | POA: Insufficient documentation

## 2011-09-14 HISTORY — DX: Unspecified abnormal cytological findings in specimens from cervix uteri: R87.619

## 2011-09-14 HISTORY — DX: Reserved for concepts with insufficient information to code with codable children: IMO0002

## 2011-09-14 LAB — URINE MICROSCOPIC-ADD ON

## 2011-09-14 LAB — URINALYSIS, ROUTINE W REFLEX MICROSCOPIC
Bilirubin Urine: NEGATIVE
Protein, ur: NEGATIVE mg/dL
Urobilinogen, UA: 0.2 mg/dL (ref 0.0–1.0)

## 2011-09-14 LAB — CBC
HCT: 36.3 % (ref 36.0–46.0)
Hemoglobin: 12.2 g/dL (ref 12.0–15.0)
RDW: 13.2 % (ref 11.5–15.5)
WBC: 9.5 10*3/uL (ref 4.0–10.5)

## 2011-09-14 LAB — HCG, QUANTITATIVE, PREGNANCY: hCG, Beta Chain, Quant, S: 2994 m[IU]/mL — ABNORMAL HIGH (ref <5)

## 2011-09-14 NOTE — MAU Provider Note (Signed)
History     CSN: [id number]  Arrival date and time: 09/14/11 4098   First Provider Initiated Contact with Patient 09/14/11 2158      Chief Complaint  Patient presents with  . Abdominal Cramping  . Vaginal Bleeding   HPI  SAB mid May at about 8 wks, didn't know it until June 10th. In meantime I got pregnant again so now possibly 4-5wks. Light bleeding this am and then increased at work and passed clots that looked weird with white streaks. Now bleeding has slowed down. Having a little bit of cramping.   Past Medical History  Diagnosis Date  . Herpes genitalia   . HSV (herpes simplex virus) infection     last outbreak in may  . Mitral valve prolapse   . Status post repair of cor triatriatum     Echocardiogram 12/05: EF 55-65%, no mitral valve prolapse, mild RVE and no mention of any residual ASD.    Marland Kitchen Abnormal Pap smear     Past Surgical History  Procedure Date  . Cardiac surgery     congenital heart defect repair at 9 mos  . Exploration post operative open heart   . No past surgeries     Family History  Problem Relation Age of Onset  . Diabetes Mother   . Asthma Mother   . Diabetes Father   . Asthma Father   . Diabetes Maternal Grandmother   . Diabetes Paternal Grandfather     History  Substance Use Topics  . Smoking status: Former Games developer  . Smokeless tobacco: Never Used  . Alcohol Use: Yes     occasional alcohol    Allergies: No Known Allergies  Prescriptions prior to admission  Medication Sig Dispense Refill  . cetirizine (ZYRTEC) 10 MG tablet Take 10 mg by mouth daily. For allergies      . Prenatal Vit-Fe Fumarate-FA (PRENATAL MULTIVITAMIN) TABS Take 1 tablet by mouth every morning.       . valACYclovir (VALTREX) 500 MG tablet Take 500 mg by mouth daily.       . cephALEXin (KEFLEX) 500 MG capsule Take 1 capsule (500 mg total) by mouth 4 (four) times daily.  40 capsule  0    Review of Systems  Gastrointestinal: Positive for abdominal pain.    Genitourinary:       Vaginal bleeding  All other systems reviewed and are negative.   Physical Exam   Blood pressure 107/69, pulse 79, temperature 98.8 F (37.1 C), temperature source Oral, resp. rate 18, height 4\' 11"  (1.499 m), weight 79.379 kg (175 lb), last menstrual period 07/22/2011, not currently breastfeeding.  Physical Exam  Constitutional: She is oriented to person, place, and time. She appears well-developed and well-nourished. No distress.  HENT:  Head: Normocephalic.  Neck: Normal range of motion. Neck supple.  Cardiovascular: Normal rate, regular rhythm and normal heart sounds.   Respiratory: Effort normal and breath sounds normal.  GI: Soft.  Genitourinary: Uterus is enlarged. Cervix exhibits no motion tenderness. Bleeding: clots, mod bleeding.  Neurological: She is alert and oriented to person, place, and time.  Skin: Skin is warm and dry.    MAU Course  Procedures  Results for orders placed during the hospital encounter of 09/14/11 (from the past 24 hour(s))  URINALYSIS, ROUTINE W REFLEX MICROSCOPIC     Status: Abnormal   Collection Time   09/14/11  8:10 PM      Component Value Range   Color, Urine YELLOW  YELLOW  APPearance CLEAR  CLEAR   Specific Gravity, Urine 1.020  1.005 - 1.030   pH 6.0  5.0 - 8.0   Glucose, UA NEGATIVE  NEGATIVE mg/dL   Hgb urine dipstick LARGE (*) NEGATIVE   Bilirubin Urine NEGATIVE  NEGATIVE   Ketones, ur NEGATIVE  NEGATIVE mg/dL   Protein, ur NEGATIVE  NEGATIVE mg/dL   Urobilinogen, UA 0.2  0.0 - 1.0 mg/dL   Nitrite NEGATIVE  NEGATIVE   Leukocytes, UA NEGATIVE  NEGATIVE  URINE MICROSCOPIC-ADD ON     Status: Normal   Collection Time   09/14/11  8:10 PM      Component Value Range   Squamous Epithelial / LPF RARE  RARE   WBC, UA 0-2  <3 WBC/hpf   RBC / HPF TOO NUMEROUS TO COUNT  <3 RBC/hpf  POCT PREGNANCY, URINE     Status: Abnormal   Collection Time   09/14/11 10:05 PM      Component Value Range   Preg Test, Ur POSITIVE (*)  NEGATIVE  HCG, QUANTITATIVE, PREGNANCY     Status: Abnormal   Collection Time   09/14/11 10:15 PM      Component Value Range   hCG, Beta Chain, Quant, S 2994 (*) <5 mIU/mL  CBC     Status: Normal   Collection Time   09/14/11 10:15 PM      Component Value Range   WBC 9.5  4.0 - 10.5 K/uL   RBC 4.18  3.87 - 5.11 MIL/uL   Hemoglobin 12.2  12.0 - 15.0 g/dL   HCT 16.1  09.6 - 04.5 %   MCV 86.8  78.0 - 100.0 fL   MCH 29.2  26.0 - 34.0 pg   MCHC 33.6  30.0 - 36.0 g/dL   RDW 40.9  81.1 - 91.4 %   Platelets 304  150 - 400 K/uL   Ultrasound: IMPRESSION:  Intrauterine gestational sac has not changed significantly in size  since the prior study on 09/03/2011, and has progressed distally  since the prior study, compatible with missed spontaneous abortion.  Small amount of associated subchorionic hemorrhage is more  prominent in appearance.  Early Intrauterine Pregnancy Failure Protocol X  Documented intrauterine pregnancy failure less than or equal to [redacted] weeks   gestation  X  No serious current illness  X  Baseline Hgb greater than or equal to 10g/dl  X  Patient has easily accessible transportation to the hospital  X  Clear preference  X  Practitioner/physician deems patient reliable  X  Counseling by practitioner or physician  X  Patient education by RN  X  Consent form signed       Rho-Gam given by RN if indicated  X  Medication dispensed  X  Cytotec 800 mcg Intravaginally by patient at home       Intravaginally by NP in MAU       Rectally by patient at home       Rectally by RN in MAU  X   Ibuprofen 600 mg 1 tablet by mouth every 6 hours as needed #30 - prescribed    Assessment and Plan  Missed SAB  Plan: DC to home RX cytotec, Ibuprofen Follow-up in office in two weeks   [facility] 09/14/2011, 10:24 PM

## 2011-09-14 NOTE — MAU Note (Signed)
I had SAB mid May at about 8 wks. But didn't know it until June 10th. In meantime I got pregnant again so now possibly 4-5wks. Had light bleeding this am and then increased at work and passed clots that looked weird with white streaks. Now bleeding has slowed down. Having alittle bit of cramping.

## 2011-09-15 DIAGNOSIS — O200 Threatened abortion: Secondary | ICD-10-CM

## 2011-09-15 MED ORDER — IBUPROFEN 600 MG PO TABS: 600.0000 mg | ORAL_TABLET | Freq: Four times a day (QID) | ORAL | Status: AC | PRN

## 2011-09-15 MED ORDER — MISOPROSTOL 200 MCG PO TABS: 800.0000 ug | ORAL_TABLET | Freq: Once | ORAL | Status: DC

## 2011-09-15 NOTE — MAU Provider Note (Signed)
Attestation of Attending Supervision of Advanced Practitioner (CNM/NP): Evaluation and management procedures were performed by the Advanced Practitioner under my supervision and collaboration.  I have reviewed the Advanced Practitioner's note and chart, and I agree with the management and plan.  Jaynie Collins, M.D. 09/15/2011 8:30 AM

## 2012-03-24 ENCOUNTER — Encounter (HOSPITAL_COMMUNITY): Payer: Self-pay

## 2012-03-24 ENCOUNTER — Inpatient Hospital Stay (HOSPITAL_COMMUNITY)
Admission: AD | Admit: 2012-03-24 | Discharge: 2012-03-24 | Disposition: A | Discharge status: Home / Self Care | Payer: Medicaid Other | Source: Ambulatory Visit | Attending: Obstetrics and Gynecology | Admitting: Obstetrics and Gynecology

## 2012-03-24 ENCOUNTER — Inpatient Hospital Stay (HOSPITAL_COMMUNITY): Payer: Medicaid Other

## 2012-03-24 DIAGNOSIS — O210 Mild hyperemesis gravidarum: Secondary | ICD-10-CM | POA: Insufficient documentation

## 2012-03-24 DIAGNOSIS — O219 Vomiting of pregnancy, unspecified: Secondary | ICD-10-CM

## 2012-03-24 DIAGNOSIS — R109 Unspecified abdominal pain: Secondary | ICD-10-CM | POA: Insufficient documentation

## 2012-03-24 LAB — URINALYSIS, ROUTINE W REFLEX MICROSCOPIC
Hgb urine dipstick: NEGATIVE
Leukocytes, UA: NEGATIVE
Specific Gravity, Urine: >1.03 — ABNORMAL HIGH (ref 1.005–1.030)
Urobilinogen, UA: 1 mg/dL (ref 0.0–1.0)

## 2012-03-24 LAB — POCT PREGNANCY, URINE: Preg Test, Ur: POSITIVE — AB

## 2012-03-24 LAB — HCG, QUANTITATIVE, PREGNANCY: hCG, Beta Chain, Quant, S: 120688 m[IU]/mL — ABNORMAL HIGH (ref <5)

## 2012-03-24 LAB — WET PREP, GENITAL
Trich, Wet Prep: NONE SEEN
Yeast Wet Prep HPF POC: NONE SEEN

## 2012-03-24 LAB — CBC
HCT: 37.6 % (ref 36.0–46.0)
Platelets: 256 10*3/uL (ref 150–400)
RDW: 13.9 % (ref 11.5–15.5)
WBC: 12.5 10*3/uL — ABNORMAL HIGH (ref 4.0–10.5)

## 2012-03-24 MED ORDER — PROMETHAZINE HCL 25 MG/ML IJ SOLN
25.0000 mg | Freq: Once | INTRAMUSCULAR | Status: AC
  Administered 2012-03-24: 25 mg via INTRAMUSCULAR
  Filled 2012-03-24: qty 1

## 2012-03-24 MED ORDER — PROMETHAZINE HCL 25 MG PO TABS: 12.5000 mg | ORAL_TABLET | Freq: Four times a day (QID) | ORAL | Status: DC | PRN

## 2012-03-24 MED ORDER — ONDANSETRON 8 MG PO TBDP
8.0000 mg | ORAL_TABLET | Freq: Once | ORAL | Status: AC
  Administered 2012-03-24: 8 mg via ORAL
  Filled 2012-03-24: qty 1

## 2012-03-24 NOTE — MAU Note (Signed)
Has been feeling sick all day, can't keep anything down.  Having pain in lower abd and vagina- started this morning. Ongoing low back pain.

## 2012-03-24 NOTE — MAU Note (Signed)
Confirmed at [facility], 12/27. First appt 01/24 at [facility].

## 2012-03-24 NOTE — MAU Provider Note (Signed)
History     CSN: [id number]  Arrival date and time: 03/24/12 1750   First Provider Initiated Contact with Patient 03/24/12 1858      Chief Complaint  Patient presents with  . Morning Sickness  . Abdominal Pain   HPI [name] is a 27 y.o. G3P1011 at [redacted]w[redacted]d who presents to MAU today with complaint of nausea and vomiting today. The patient states that she has had morning sickness throughout the pregnancy so far, but that today she has been nauseous and vomiting all day. She has been unable to keep anything down, liquid or solid. The last time she vomited was when she arrived here in MAU. The patient denies diarrhea or other GI symptoms. She denies fever, vaginal bleeding, abnormal discharge or LOF. She denies urinary frequency or urgency, but does have some mild dysuria. The patient has her first appointment for prenatal care on 04/04/12.   OB History    Grav Para Term Preterm Abortions TAB SAB Ect Mult Living   3 1 1  0 1 0 1 0 0 1      Past Medical History  Diagnosis Date  . Herpes genitalia   . HSV (herpes simplex virus) infection     last outbreak in may  . Mitral valve prolapse   . Status post repair of cor triatriatum     Echocardiogram 12/05: EF 55-65%, no mitral valve prolapse, mild RVE and no mention of any residual ASD.    Marland Kitchen Abnormal Pap smear     Past Surgical History  Procedure Date  . Cardiac surgery     congenital heart defect repair at 9 mos  . Exploration post operative open heart   . No past surgeries     Family History  Problem Relation Age of Onset  . Diabetes Mother   . Asthma Mother   . Diabetes Father   . Asthma Father   . Diabetes Maternal Grandmother   . Diabetes Paternal Grandfather     History  Substance Use Topics  . Smoking status: Former Games developer  . Smokeless tobacco: Never Used  . Alcohol Use: Yes     Comment: occasional alcohol    Allergies: No Known Allergies  Prescriptions prior to admission  Medication Sig Dispense  Refill  . cetirizine (ZYRTEC) 10 MG tablet Take 10 mg by mouth daily as needed. For allergies      . Prenatal Vit-Fe Fumarate-FA (PRENATAL MULTIVITAMIN) TABS Take 1 tablet by mouth every morning.       . valACYclovir (VALTREX) 500 MG tablet Take 500 mg by mouth daily.         ROS All negative unless otherwise noted in HPI Physical Exam   Blood pressure 114/66, pulse 102, temperature 99.4 F (37.4 C), temperature source Oral, resp. rate 18, height 4\' 11"  (1.499 m), weight 176 lb (79.833 kg), last menstrual period 01/28/2012, SpO2 100.00%, unknown if currently breastfeeding.  Physical Exam  Constitutional: She is oriented to person, place, and time. She appears well-developed and well-nourished. No distress.  HENT:  Head: Normocephalic and atraumatic.  Cardiovascular: Normal rate, regular rhythm and normal heart sounds.   Respiratory: Effort normal and breath sounds normal. No respiratory distress.  GI: Soft. Bowel sounds are normal. She exhibits no distension and no mass. There is tenderness (mild diffuse tenderness to palpation with more exsquisite tenderness in the lower abdomen at midline). There is no rebound and no guarding.  Neurological: She is alert and oriented to person, place, and time.  Skin: Skin is warm and dry. No erythema.  Psychiatric: She has a normal mood and affect.   Results for orders placed during the hospital encounter of 03/24/12 (from the past 24 hour(s))  POCT PREGNANCY, URINE     Status: Abnormal   Collection Time   03/24/12  6:28 PM      Component Value Range   Preg Test, Ur POSITIVE (*) NEGATIVE  URINALYSIS, ROUTINE W REFLEX MICROSCOPIC     Status: Abnormal   Collection Time   03/24/12  6:50 PM      Component Value Range   Color, Urine YELLOW  YELLOW   APPearance CLEAR  CLEAR   Specific Gravity, Urine >1.030 (*) 1.005 - 1.030   pH 6.0  5.0 - 8.0   Glucose, UA NEGATIVE  NEGATIVE mg/dL   Hgb urine dipstick NEGATIVE  NEGATIVE   Bilirubin Urine NEGATIVE   NEGATIVE   Ketones, ur 15 (*) NEGATIVE mg/dL   Protein, ur NEGATIVE  NEGATIVE mg/dL   Urobilinogen, UA 1.0  0.0 - 1.0 mg/dL   Nitrite NEGATIVE  NEGATIVE   Leukocytes, UA NEGATIVE  NEGATIVE  CBC     Status: Abnormal   Collection Time   03/24/12  7:28 PM      Component Value Range   WBC 12.5 (*) 4.0 - 10.5 K/uL   RBC 4.35  3.87 - 5.11 MIL/uL   Hemoglobin 13.1  12.0 - 15.0 g/dL   HCT 16.1  09.6 - 04.5 %   MCV 86.4  78.0 - 100.0 fL   MCH 30.1  26.0 - 34.0 pg   MCHC 34.8  30.0 - 36.0 g/dL   RDW 40.9  81.1 - 91.4 %   Platelets 256  150 - 400 K/uL  HCG, QUANTITATIVE, PREGNANCY     Status: Abnormal   Collection Time   03/24/12  7:28 PM      Component Value Range   hCG, Beta Chain, [place] Longest [id number] (*) <5 mIU/mL  WET PREP, GENITAL     Status: Abnormal   Collection Time   03/24/12  8:38 PM      Component Value Range   Yeast Wet Prep HPF POC NONE SEEN  NONE SEEN   Trich, Wet Prep NONE SEEN  NONE SEEN   Clue Cells Wet Prep HPF POC NONE SEEN  NONE SEEN   WBC, Wet Prep HPF POC FEW (*) NONE SEEN    MAU Course  Procedures  MDM Discussed patient with Dr. Dareen Piano. He has given consent to work-up patient for current pregnancy and N/V concerns today.  US Ob Comp Less 14 Wks  03/24/2012  *RADIOLOGY REPORT*  Clinical Data: Pain.  Nausea and vomiting.  Estimated gestational age by LMP is 8 weeks 0 days.  Quantitative beta HCG is 120,688.  OBSTETRIC <14 WK ULTRASOUND  Technique:  Transabdominal ultrasound was performed for evaluation of the gestation as well as the maternal uterus and adnexal regions.  Comparison:  None.  Intrauterine gestational sac: A single intrauterine pregnancy is demonstrated.  Gestational sac demonstrates normal contours. No subchorionic hemorrhage demonstrated. Yolk sac: The yolk sac is visualized. Embryo: Fetal pole is visualized. Cardiac Activity: Fetal cardiac activity is visualized. Heart Rate: 144 bpm  CRL:  10.3 mm  seven w  one d       [place] EDC: 11/09/2012  Maternal  uterus/Adnexae: Limited views of the uterus and ovaries are obtained.  No myometrial uterine mass lesions are identified.  These ovaries appear symmetrical and no  abnormal adnexal masses are demonstrated. The right ovary measures 2.6 x 1.8 x 2 cm.  The left ovary measures 1.9 x 1.3 x 1.4 cm.  No free pelvic fluid collections.  IMPRESSION: Old intrauterine pregnancy demonstrated.  Estimated gestational age by crown-rump length is 7 weeks 1 day.   Original Report Authenticated By: Burman Nieves, M.D.      Patient given ODT Zofran and IM phenergan - patient able to take in small amount PO. Now sleeping comfortably. Waiting for [place].  2000 - Care turned over to [facility] Orlene Och, NP 03/24/2012, 10:24 PM   Pelvic exam - external genitalia without lesions, white discharge vaginal vault. Cervix long, closed, mild CMT, no adnexal tenderness. Uterus slightly enlarged.  Assessment: 27 y.o. female with IUP 7 weeks 1 day with cardiac activity 144   Nausea and vomiting in pregnancy  Plan:  Rx Phenergan   Follow up in the office, return here as needed  Patient feeling better taking po fluids and eating crackers without nausea  Discussed with the patient and all questioned fully answered. She will return if any problems arise.   Medication List     As of 03/24/2012 10:35 PM    START taking these medications         promethazine 25 MG tablet   Commonly known as: PHENERGAN   Take 0.5 tablets (12.5 mg total) by mouth every 6 (six) hours as needed for nausea.      CONTINUE taking these medications         cetirizine 10 MG tablet   Commonly known as: ZYRTEC      prenatal multivitamin Tabs      valACYclovir 500 MG tablet   Commonly known as: VALTREX          Where to get your medications    These are the prescriptions that you need to pick up.   You may get these medications from any pharmacy.         promethazine 25 MG tablet

## 2012-03-25 LAB — GC/CHLAMYDIA PROBE AMP: CT Probe RNA: NEGATIVE

## 2012-08-29 ENCOUNTER — Inpatient Hospital Stay (HOSPITAL_COMMUNITY)
Admission: AD | Admit: 2012-08-29 | Discharge: 2012-08-29 | Disposition: A | Discharge status: Home / Self Care | Payer: Medicaid Other | Source: Ambulatory Visit | Attending: Obstetrics and Gynecology | Admitting: Obstetrics and Gynecology

## 2012-08-29 ENCOUNTER — Encounter (HOSPITAL_COMMUNITY): Payer: Self-pay | Admitting: *Deleted

## 2012-08-29 DIAGNOSIS — O4700 False labor before 37 completed weeks of gestation, unspecified trimester: Secondary | ICD-10-CM | POA: Insufficient documentation

## 2012-08-29 DIAGNOSIS — O479 False labor, unspecified: Secondary | ICD-10-CM

## 2012-08-29 DIAGNOSIS — R109 Unspecified abdominal pain: Secondary | ICD-10-CM | POA: Insufficient documentation

## 2012-08-29 DIAGNOSIS — O139 Gestational [pregnancy-induced] hypertension without significant proteinuria, unspecified trimester: Secondary | ICD-10-CM | POA: Insufficient documentation

## 2012-08-29 DIAGNOSIS — O133 Gestational [pregnancy-induced] hypertension without significant proteinuria, third trimester: Secondary | ICD-10-CM

## 2012-08-29 LAB — WET PREP, GENITAL
Clue Cells Wet Prep HPF POC: NONE SEEN
Trich, Wet Prep: NONE SEEN

## 2012-08-29 LAB — COMPREHENSIVE METABOLIC PANEL
AST: 19 U/L (ref 0–37)
Albumin: 2.2 g/dL — ABNORMAL LOW (ref 3.5–5.2)
BUN: 4 mg/dL — ABNORMAL LOW (ref 6–23)
Chloride: 103 mEq/L (ref 96–112)
Creatinine, Ser: 0.42 mg/dL — ABNORMAL LOW (ref 0.50–1.10)
Potassium: 3.7 mEq/L (ref 3.5–5.1)
Total Bilirubin: 0.5 mg/dL (ref 0.3–1.2)
Total Protein: 5.3 g/dL — ABNORMAL LOW (ref 6.0–8.3)

## 2012-08-29 LAB — CBC
MCHC: 34 g/dL (ref 30.0–36.0)
MCV: 89.1 fL (ref 78.0–100.0)
Platelets: 265 10*3/uL (ref 150–400)
RDW: 14.7 % (ref 11.5–15.5)
WBC: 10.3 10*3/uL (ref 4.0–10.5)

## 2012-08-29 LAB — URINALYSIS, ROUTINE W REFLEX MICROSCOPIC
Nitrite: NEGATIVE
Specific Gravity, Urine: 1.01 (ref 1.005–1.030)
Urobilinogen, UA: 0.2 mg/dL (ref 0.0–1.0)
pH: 7 (ref 5.0–8.0)

## 2012-08-29 LAB — FETAL FIBRONECTIN: Fetal Fibronectin: NEGATIVE

## 2012-08-29 LAB — URINE MICROSCOPIC-ADD ON

## 2012-08-29 LAB — URIC ACID: Uric Acid, Serum: 7.2 mg/dL — ABNORMAL HIGH (ref 2.4–7.0)

## 2012-08-29 NOTE — Progress Notes (Signed)
Confirmed by Konrad Felix,  CNM and report called  By CNM to Dr. Henderson Cloud

## 2012-08-29 NOTE — MAU Note (Signed)
Patient states she had contractions last night and two this morning but none now. States she had a little spotting last night but none today. Reports good fetal movement. States she has increasing swelling in the feet.

## 2012-08-29 NOTE — MAU Provider Note (Signed)
History     CSN: [id number]  Arrival date and time: 08/29/12 1141   First Provider Initiated Contact with Patient 08/29/12 1306      Chief Complaint  Patient presents with  . Abdominal Pain   Abdominal Pain Pertinent negatives include no constipation, diarrhea, dysuria, fever, frequency, headaches, nausea or vomiting.    [name] is a 27 y.o.  G3P1011 at [redacted]w[redacted]d who presents today with contractions. She states that last night she had contractions every 7-10 mins for a couple of hours. Then she went to sleep and the contractions seemed to stop. She also had a quarter size spot of blood on her pad. She states that she last had intercourse on Tuesday. She denies any LOF or VB. She denies any HA. Visual disturbances or epigastric pain.   Past Medical History  Diagnosis Date  . Herpes genitalia   . HSV (herpes simplex virus) infection     last outbreak in may  . Mitral valve prolapse   . Status post repair of cor triatriatum     Echocardiogram 12/05: EF 55-65%, no mitral valve prolapse, mild RVE and no mention of any residual ASD.    Marland Kitchen Abnormal Pap smear     Past Surgical History  Procedure Laterality Date  . Cardiac surgery      congenital heart defect repair at 9 mos  . Exploration post operative open heart    . No past surgeries      Family History  Problem Relation Age of Onset  . Diabetes Mother   . Asthma Mother   . Diabetes Father   . Asthma Father   . Diabetes Maternal Grandmother   . Diabetes Paternal Grandfather     History  Substance Use Topics  . Smoking status: Former Games developer  . Smokeless tobacco: Never Used  . Alcohol Use: Yes     Comment: occasional alcohol    Allergies: No Known Allergies  Prescriptions prior to admission  Medication Sig Dispense Refill  . FLUoxetine (PROZAC) 20 MG capsule Take 20 mg by mouth daily.      Marland Kitchen loratadine (CLARITIN) 10 MG tablet Take 10 mg by mouth daily.      . Prenatal Vit-Fe Fumarate-FA (PRENATAL  MULTIVITAMIN) TABS Take 1 tablet by mouth every morning.       . promethazine (PHENERGAN) 25 MG tablet Take 12.5 mg by mouth every 6 (six) hours as needed for nausea.      . valACYclovir (VALTREX) 500 MG tablet Take 500 mg by mouth daily.         Review of Systems  Constitutional: Negative for fever.  Eyes: Negative for blurred vision.  Respiratory: Negative for shortness of breath.   Cardiovascular: Negative for chest pain.  Gastrointestinal: Negative for nausea, vomiting, abdominal pain, diarrhea and constipation.  Genitourinary: Negative for dysuria, urgency and frequency.  Neurological: Negative for dizziness and headaches.   Physical Exam   Blood pressure 132/84, pulse 103, temperature 98.8 F (37.1 C), temperature source Oral, resp. rate 18, height 4' 10.5" (1.486 m), weight 90.266 kg (199 lb), last menstrual period 01/28/2012, SpO2 99.00%.  Physical Exam  Nursing note and vitals reviewed. Constitutional: She is oriented to person, place, and time. She appears well-developed and well-nourished. No distress.  Cardiovascular: Normal rate.   Respiratory: Effort normal.  GI: Soft. There is no tenderness.  Genitourinary:   External: no lesion Vagina: small amount of white discharge Cervix: closed/thick/high Uterus: AGA   Neurological: She is alert and oriented  to person, place, and time. She displays abnormal reflex.  +clonus X 2 beats on the left leg.   Skin: Skin is warm and dry.    MAU Course  Procedures  Results for orders placed during the hospital encounter of 08/29/12 (from the past 24 hour(s))  URINALYSIS, ROUTINE W REFLEX MICROSCOPIC     Status: Abnormal   Collection Time    08/29/12  2:25 AM      Result Value Range   Color, Urine YELLOW  YELLOW   APPearance CLEAR  CLEAR   Specific Gravity, Urine 1.010  1.005 - 1.030   pH 7.0  5.0 - 8.0   Glucose, UA NEGATIVE  NEGATIVE mg/dL   Hgb urine dipstick TRACE (*) NEGATIVE   Bilirubin Urine NEGATIVE  NEGATIVE    Ketones, ur NEGATIVE  NEGATIVE mg/dL   Protein, ur NEGATIVE  NEGATIVE mg/dL   Urobilinogen, UA 0.2  0.0 - 1.0 mg/dL   Nitrite NEGATIVE  NEGATIVE   Leukocytes, UA TRACE (*) NEGATIVE  URINE MICROSCOPIC-ADD ON     Status: Abnormal   Collection Time    08/29/12  2:25 AM      Result Value Range   Squamous Epithelial / LPF FEW (*) RARE   WBC, UA 0-2  <3 WBC/hpf  FETAL FIBRONECTIN     Status: None   Collection Time    08/29/12  1:15 PM      Result Value Range   Fetal Fibronectin NEGATIVE  NEGATIVE  WET PREP, GENITAL     Status: Abnormal   Collection Time    08/29/12  1:15 PM      Result Value Range   Yeast Wet Prep HPF POC NONE SEEN  NONE SEEN   Trich, Wet Prep NONE SEEN  NONE SEEN   Clue Cells Wet Prep HPF POC NONE SEEN  NONE SEEN   WBC, Wet Prep HPF POC FEW (*) NONE SEEN  CBC     Status: Abnormal   Collection Time    08/29/12  1:32 PM      Result Value Range   WBC 10.3  4.0 - 10.5 K/uL   RBC 3.50 (*) 3.87 - 5.11 MIL/uL   Hemoglobin 10.6 (*) 12.0 - 15.0 g/dL   HCT 16.1 (*) 09.6 - 04.5 %   MCV 89.1  78.0 - 100.0 fL   MCH 30.3  26.0 - 34.0 pg   MCHC 34.0  30.0 - 36.0 g/dL   RDW 40.9  81.1 - 91.4 %   Platelets 265  150 - 400 K/uL  COMPREHENSIVE METABOLIC PANEL     Status: Abnormal   Collection Time    08/29/12  1:32 PM      Result Value Range   Sodium 138  135 - 145 mEq/L   Potassium 3.7  3.5 - 5.1 mEq/L   Chloride 103  96 - 112 mEq/L   CO2 24  19 - 32 mEq/L   Glucose, Bld 77  70 - 99 mg/dL   BUN 4 (*) 6 - 23 mg/dL   Creatinine, Ser 7.82 (*) 0.50 - 1.10 mg/dL   Calcium 9.4  8.4 - 95.6 mg/dL   Total Protein 5.3 (*) 6.0 - 8.3 g/dL   Albumin 2.2 (*) 3.5 - 5.2 g/dL   AST 19  0 - 37 U/L   ALT 9  0 - 35 U/L   Alkaline Phosphatase 114  39 - 117 U/L   Total Bilirubin 0.5  0.3 - 1.2 mg/dL  GFR calc non Af Amer >90  >90 mL/min   GFR calc Af Amer >90  >90 mL/min  URIC ACID     Status: Abnormal   Collection Time    08/29/12  1:32 PM      Result Value Range   Uric Acid, Serum  7.2 (*) 2.4 - 7.0 mg/dL     1610: Spoke with Dr. Henderson Cloud, will send labs and call back with an update.  1422: Spoke with Henderson Cloud, reviewed labs. OK for dc home, FU with the office on Monday.   Assessment and Plan   1. Labor, false (Braxton-Hicks), antepartum   2. Gestational hypertension w/o significant proteinuria in 3rd trimester    Pre-X danger signs reviewed FU with the office on Monday.   Tawnya Crook 08/29/2012, 1:16 PM

## 2012-09-02 ENCOUNTER — Inpatient Hospital Stay (HOSPITAL_COMMUNITY)
Admission: AD | Admit: 2012-09-02 | Discharge: 2012-09-02 | DRG: 781 | Disposition: A | Discharge status: Home / Self Care | Payer: Medicaid Other | Source: Ambulatory Visit | Attending: Obstetrics & Gynecology | Admitting: Obstetrics & Gynecology

## 2012-09-02 ENCOUNTER — Encounter (HOSPITAL_COMMUNITY): Payer: Self-pay | Admitting: *Deleted

## 2012-09-02 DIAGNOSIS — R51 Headache: Secondary | ICD-10-CM | POA: Diagnosis present

## 2012-09-02 DIAGNOSIS — R030 Elevated blood-pressure reading, without diagnosis of hypertension: Secondary | ICD-10-CM | POA: Diagnosis present

## 2012-09-02 DIAGNOSIS — R109 Unspecified abdominal pain: Secondary | ICD-10-CM | POA: Diagnosis present

## 2012-09-02 DIAGNOSIS — O99891 Other specified diseases and conditions complicating pregnancy: Principal | ICD-10-CM | POA: Diagnosis present

## 2012-09-02 HISTORY — DX: Gestational diabetes mellitus in pregnancy, unspecified control: O24.419

## 2012-09-02 HISTORY — DX: Unspecified infectious disease: B99.9

## 2012-09-02 HISTORY — DX: Anxiety disorder, unspecified: F41.9

## 2012-09-02 LAB — CBC
HCT: 31.4 % — ABNORMAL LOW (ref 36.0–46.0)
Hemoglobin: 10.6 g/dL — ABNORMAL LOW (ref 12.0–15.0)
MCH: 29.9 pg (ref 26.0–34.0)
MCHC: 33.8 g/dL (ref 30.0–36.0)
MCV: 88.7 fL (ref 78.0–100.0)
RDW: 14.7 % (ref 11.5–15.5)

## 2012-09-02 LAB — COMPREHENSIVE METABOLIC PANEL
Albumin: 2.3 g/dL — ABNORMAL LOW (ref 3.5–5.2)
Alkaline Phosphatase: 119 U/L — ABNORMAL HIGH (ref 39–117)
BUN: 4 mg/dL — ABNORMAL LOW (ref 6–23)
Calcium: 9.3 mg/dL (ref 8.4–10.5)
Creatinine, Ser: 0.39 mg/dL — ABNORMAL LOW (ref 0.50–1.10)
GFR calc Af Amer: >90 mL/min (ref >90)
Glucose, Bld: 75 mg/dL (ref 70–99)
Total Protein: 5.7 g/dL — ABNORMAL LOW (ref 6.0–8.3)

## 2012-09-02 LAB — PROTEIN / CREATININE RATIO, URINE: Protein Creatinine Ratio: 0.17 — ABNORMAL HIGH (ref 0.00–0.15)

## 2012-09-02 LAB — URIC ACID: Uric Acid, Serum: 6.1 mg/dL (ref 2.4–7.0)

## 2012-09-02 LAB — LACTATE DEHYDROGENASE: LDH: 251 U/L — ABNORMAL HIGH (ref 94–250)

## 2012-09-02 LAB — TSH: TSH: 3.527 u[IU]/mL (ref 0.350–4.500)

## 2012-09-02 MED ORDER — ACETAMINOPHEN 500 MG PO TABS
1000.0000 mg | ORAL_TABLET | Freq: Once | ORAL | Status: AC
  Administered 2012-09-02: 1000 mg via ORAL
  Filled 2012-09-02: qty 2

## 2012-09-02 NOTE — MAU Note (Signed)
Ongoing problem with swelling in hands and feet.  First time BP was elevated was last week.

## 2012-09-02 NOTE — MAU Note (Signed)
Patient states she has been extremely tired and weak. Was sent from the office today for evaluation of elevated blood pressure. Patient states she has some off and on cramping and good fetal movement. Denies bleeding or leaking.

## 2012-09-02 NOTE — MAU Provider Note (Signed)
History     CSN: [id number]  Arrival date and time: 09/02/12 1121   First Provider Initiated Contact with Patient 09/02/12 1233      Chief Complaint  Patient presents with  . Fatigue  . Hypertension   HPI [name] is a 27 y.o. G3P1011 at [redacted]w[redacted]d who was sent to MAU today from the office for further evaluation of elevated BPs. The patient states that she has had some headaches recently, but none now. She frequently "sees spots." She denies blurred vision or RUQ pain. She is having peripheral edema that is often relieved with rest.    OB History   Grav Para Term Preterm Abortions TAB SAB Ect Mult Living   3 1 1  0 1 0 1 0 0 1      Past Medical History  Diagnosis Date  . Herpes genitalia   . HSV (herpes simplex virus) infection     last outbreak in may  . Mitral valve prolapse   . Status post repair of cor triatriatum     Echocardiogram 12/05: EF 55-65%, no mitral valve prolapse, mild RVE and no mention of any residual ASD.    Marland Kitchen Abnormal Pap smear   . Gestational diabetes   . Infection     UTI  . Anxiety     on meds    Past Surgical History  Procedure Laterality Date  . Cardiac surgery      congenital heart defect repair at 9 mos  . Exploration post operative open heart      Family History  Problem Relation Age of Onset  . Diabetes Mother   . Asthma Mother   . Diabetes Father   . Asthma Father   . Diabetes Maternal Grandmother   . Diabetes Paternal Grandfather     History  Substance Use Topics  . Smoking status: Former Games developer  . Smokeless tobacco: Never Used     Comment: quit 1st trimester  . Alcohol Use: Yes     Comment: occasional alcohol, not with preg    Allergies: No Known Allergies  Prescriptions prior to admission  Medication Sig Dispense Refill  . acetaminophen (TYLENOL) 500 MG tablet Take 1,000 mg by mouth every 6 (six) hours as needed for pain.      Marland Kitchen FLUoxetine (PROZAC) 20 MG capsule Take 20 mg by mouth daily.      Marland Kitchen  loratadine (CLARITIN) 10 MG tablet Take 10 mg by mouth daily.      . Prenatal Vit-Fe Fumarate-FA (PRENATAL MULTIVITAMIN) TABS Take 1 tablet by mouth every morning.       . promethazine (PHENERGAN) 25 MG tablet Take 12.5 mg by mouth every 6 (six) hours as needed for nausea.      . valACYclovir (VALTREX) 500 MG tablet Take 500 mg by mouth daily.         Review of Systems  Constitutional: Positive for malaise/fatigue. Negative for fever.  Eyes: Negative for blurred vision.  Gastrointestinal: Positive for nausea and vomiting. Negative for abdominal pain, diarrhea and constipation.  Genitourinary: Positive for dysuria. Negative for urgency and frequency.       Neg - vaginal bleeding, discharge, LOF  Neurological: Positive for weakness and headaches. Negative for dizziness and loss of consciousness.   Physical Exam   Blood pressure 131/81, pulse 96, temperature 98.5 F (36.9 C), temperature source Oral, resp. rate 20, height 4' 10.5" (1.486 m), weight 198 lb 9.6 oz (90.084 kg), last menstrual period 01/28/2012, SpO2 98.00%.  Physical  Exam  Constitutional: She is oriented to person, place, and time. She appears well-developed and well-nourished. No distress.  HENT:  Head: Normocephalic and atraumatic.  Cardiovascular: Normal rate, regular rhythm and normal heart sounds.   Respiratory: Effort normal and breath sounds normal. No respiratory distress.  GI: Soft. She exhibits no distension and no mass. There is no tenderness. There is no rebound and no guarding.  Neurological: She is alert and oriented to person, place, and time. She has normal reflexes.  No clonus  Skin: Skin is warm and dry. No erythema.  Psychiatric: She has a normal mood and affect.   Results for orders placed during the hospital encounter of 09/02/12 (from the past 24 hour(s))  PROTEIN / CREATININE RATIO, URINE     Status: Abnormal   Collection Time    09/02/12 11:45 AM      Result Value Range   Creatinine, Urine 95.51      Total Protein, Urine 16.2     PROTEIN CREATININE RATIO 0.17 (*) 0.00 - 0.15  CBC     Status: Abnormal   Collection Time    09/02/12 11:46 AM      Result Value Range   WBC 10.3  4.0 - 10.5 K/uL   RBC 3.54 (*) 3.87 - 5.11 MIL/uL   Hemoglobin 10.6 (*) 12.0 - 15.0 g/dL   HCT 16.1 (*) 09.6 - 04.5 %   MCV 88.7  78.0 - 100.0 fL   MCH 29.9  26.0 - 34.0 pg   MCHC 33.8  30.0 - 36.0 g/dL   RDW 40.9  81.1 - 91.4 %   Platelets 284  150 - 400 K/uL  COMPREHENSIVE METABOLIC PANEL     Status: Abnormal   Collection Time    09/02/12 11:46 AM      Result Value Range   Sodium 136  135 - 145 mEq/L   Potassium 3.7  3.5 - 5.1 mEq/L   Chloride 102  96 - 112 mEq/L   CO2 23  19 - 32 mEq/L   Glucose, Bld 75  70 - 99 mg/dL   BUN 4 (*) 6 - 23 mg/dL   Creatinine, Ser 7.82 (*) 0.50 - 1.10 mg/dL   Calcium 9.3  8.4 - 95.6 mg/dL   Total Protein 5.7 (*) 6.0 - 8.3 g/dL   Albumin 2.3 (*) 3.5 - 5.2 g/dL   AST 21  0 - 37 U/L   ALT 9  0 - 35 U/L   Alkaline Phosphatase 119 (*) 39 - 117 U/L   Total Bilirubin 0.6  0.3 - 1.2 mg/dL   GFR calc non Af Amer >90  >90 mL/min   GFR calc Af Amer >90  >90 mL/min  URIC ACID     Status: None   Collection Time    09/02/12 11:46 AM      Result Value Range   Uric Acid, Serum 6.1  2.4 - 7.0 mg/dL  LACTATE DEHYDROGENASE     Status: Abnormal   Collection Time    09/02/12 11:46 AM      Result Value Range   LDH 251 (*) 94 - 250 U/L   Patient Vitals for the past 24 hrs:  BP Temp Temp src Pulse Resp SpO2 Height Weight  09/02/12 1246 131/81 mmHg - - 96 - - - -  09/02/12 1231 132/85 mmHg - - 95 - - - -  09/02/12 1220 142/87 mmHg - - 102 - - - -  09/02/12 1139 125/79 mmHg 98.5  F (36.9 C) Oral 101 20 98 % 4' 10.5" (1.486 m) 198 lb 9.6 oz (90.084 kg)   Fetal Monitoring: Baseline: 120 bpm, moderate variability, + accelerations, no decelertions Contractions: None MAU Course  Procedures None  MDM Discussed patient with Dr. Arlyce Dice. Ok for discharge. Follow-up next week in  the office   Assessment and Plan  A: Elevated BP in pregnancy  P: Discharge home Warning signs for pre-eclampsia discussed Patient advised to call office for appointment next week Patient may return to MAU as needed  Freddi Starr, PA-C  09/02/2012, 1:47 PM

## 2012-09-03 ENCOUNTER — Encounter: Payer: Self-pay | Admitting: *Deleted

## 2012-09-03 ENCOUNTER — Encounter: Payer: Medicaid Other | Attending: Obstetrics and Gynecology | Admitting: *Deleted

## 2012-09-03 VITALS — Ht 59.0 in | Wt 198.3 lb

## 2012-09-03 DIAGNOSIS — O99810 Abnormal glucose complicating pregnancy: Secondary | ICD-10-CM

## 2012-09-03 DIAGNOSIS — Z713 Dietary counseling and surveillance: Secondary | ICD-10-CM | POA: Insufficient documentation

## 2012-09-03 NOTE — Patient Instructions (Signed)
Goals:  Check glucose levels per MD as instructed  Follow Gestational Diabetes Diet as instructed  Call for follow-up as needed    

## 2012-09-03 NOTE — Progress Notes (Signed)
  Patient was seen on 09/03/12 for Gestational Diabetes self-management class at the [facility]. The following learning objectives were met by the patient during this course:   States the definition of Gestational Diabetes  States why dietary management is important in controlling blood glucose  Describes the effects each nutrient has on blood glucose levels  Demonstrates ability to create a balanced meal plan  Demonstrates carbohydrate counting   States when to check blood glucose levels  Demonstrates proper blood glucose monitoring techniques  States the effect of stress and exercise on blood glucose levels  States the importance of limiting caffeine and abstaining from alcohol and smoking  Blood glucose monitor given: [employer] Nano BG Monitoring Kit Lot # [id number] Exp: 12/09/13 Blood glucose reading: 88 mg/dl  Patient instructed to monitor glucose levels: FBS: 60 - <90 1 hour: <140 2 hour: <120  *Patient received handouts:  Nutrition Diabetes and Pregnancy  Carbohydrate Counting List  Patient will be seen for follow-up as needed.

## 2012-09-09 ENCOUNTER — Encounter (HOSPITAL_COMMUNITY): Payer: Self-pay

## 2012-09-09 ENCOUNTER — Inpatient Hospital Stay (HOSPITAL_COMMUNITY)
Admission: AD | Admit: 2012-09-09 | Discharge: 2012-09-09 | Disposition: A | Discharge status: Home / Self Care | Payer: Medicaid Other | Source: Ambulatory Visit | Attending: Obstetrics and Gynecology | Admitting: Obstetrics and Gynecology

## 2012-09-09 ENCOUNTER — Inpatient Hospital Stay (HOSPITAL_COMMUNITY): Payer: Medicaid Other

## 2012-09-09 DIAGNOSIS — O4700 False labor before 37 completed weeks of gestation, unspecified trimester: Secondary | ICD-10-CM | POA: Insufficient documentation

## 2012-09-09 DIAGNOSIS — O479 False labor, unspecified: Secondary | ICD-10-CM

## 2012-09-09 DIAGNOSIS — Y92009 Unspecified place in unspecified non-institutional (private) residence as the place of occurrence of the external cause: Secondary | ICD-10-CM

## 2012-09-09 DIAGNOSIS — R109 Unspecified abdominal pain: Secondary | ICD-10-CM | POA: Insufficient documentation

## 2012-09-09 DIAGNOSIS — O4703 False labor before 37 completed weeks of gestation, third trimester: Secondary | ICD-10-CM

## 2012-09-09 DIAGNOSIS — W010XXA Fall on same level from slipping, tripping and stumbling without subsequent striking against object, initial encounter: Secondary | ICD-10-CM | POA: Insufficient documentation

## 2012-09-09 LAB — COMPREHENSIVE METABOLIC PANEL
AST: 25 U/L (ref 0–37)
Albumin: 2.1 g/dL — ABNORMAL LOW (ref 3.5–5.2)
Alkaline Phosphatase: 125 U/L — ABNORMAL HIGH (ref 39–117)
BUN: 5 mg/dL — ABNORMAL LOW (ref 6–23)
Chloride: 102 mEq/L (ref 96–112)
Creatinine, Ser: 0.41 mg/dL — ABNORMAL LOW (ref 0.50–1.10)
Potassium: 3.5 mEq/L (ref 3.5–5.1)
Total Protein: 5.7 g/dL — ABNORMAL LOW (ref 6.0–8.3)

## 2012-09-09 LAB — URINALYSIS, ROUTINE W REFLEX MICROSCOPIC
Bilirubin Urine: NEGATIVE
Glucose, UA: NEGATIVE mg/dL
Hgb urine dipstick: NEGATIVE
Ketones, ur: NEGATIVE mg/dL
Leukocytes, UA: NEGATIVE
Nitrite: NEGATIVE
Protein, ur: NEGATIVE mg/dL
Specific Gravity, Urine: 1.01 (ref 1.005–1.030)
Urobilinogen, UA: 0.2 mg/dL (ref 0.0–1.0)
pH: 7 (ref 5.0–8.0)

## 2012-09-09 LAB — CBC
HCT: 31 % — ABNORMAL LOW (ref 36.0–46.0)
Hemoglobin: 10.4 g/dL — ABNORMAL LOW (ref 12.0–15.0)
MCH: 29.9 pg (ref 26.0–34.0)
MCHC: 33.5 g/dL (ref 30.0–36.0)
MCV: 89.1 fL (ref 78.0–100.0)
Platelets: 269 10*3/uL (ref 150–400)
RBC: 3.48 MIL/uL — ABNORMAL LOW (ref 3.87–5.11)
RDW: 14.7 % (ref 11.5–15.5)
WBC: 12.1 10*3/uL — ABNORMAL HIGH (ref 4.0–10.5)

## 2012-09-09 MED ORDER — NIFEDIPINE 10 MG PO CAPS
10.0000 mg | ORAL_CAPSULE | Freq: Once | ORAL | Status: AC
  Administered 2012-09-09: 10 mg via ORAL
  Filled 2012-09-09: qty 1

## 2012-09-09 MED ORDER — NIFEDIPINE 10 MG PO CAPS: 10.0000 mg | ORAL_CAPSULE | Freq: Three times a day (TID) | ORAL | Status: DC

## 2012-09-09 NOTE — MAU Provider Note (Signed)
History     CSN: [id number]  Arrival date and time: 09/09/12 4098   First Provider Initiated Contact with Patient 09/09/12 0505      Chief Complaint  Patient presents with  . Fall  . Abdominal Pain  . Contractions   HPI [name] is a 27 y.o. G3P1011 at [redacted]w[redacted]d who presents to MAU today after a fall on her abdomen on Sunday. The patient states that she fell face first after tripping on a root in the grass. She did make contact with her abdomen. She is now having severe right sided and lower abdominal pain. She is also having contractions every few minutes. She rates her pain at 8/10 now. She also states some LOF on Sunday, none since. She denies vaginal bleeding. She reports good fetal movement.   OB History   Grav Para Term Preterm Abortions TAB SAB Ect Mult Living   3 1 1  0 1 0 1 0 0 1      Past Medical History  Diagnosis Date  . Herpes genitalia   . HSV (herpes simplex virus) infection     last outbreak in may  . Mitral valve prolapse   . Status post repair of cor triatriatum     Echocardiogram 12/05: EF 55-65%, no mitral valve prolapse, mild RVE and no mention of any residual ASD.    Marland Kitchen Abnormal Pap smear   . Gestational diabetes   . Infection     UTI  . Anxiety     on meds    Past Surgical History  Procedure Laterality Date  . Cardiac surgery      congenital heart defect repair at 9 mos  . Exploration post operative open heart      Family History  Problem Relation Age of Onset  . Diabetes Mother   . Asthma Mother   . Diabetes Father   . Asthma Father   . Diabetes Maternal Grandmother   . Diabetes Paternal Grandfather     History  Substance Use Topics  . Smoking status: Former Games developer  . Smokeless tobacco: Never Used     Comment: quit 1st trimester  . Alcohol Use: Yes     Comment: occasional alcohol, not with preg    Allergies: No Known Allergies  Prescriptions prior to admission  Medication Sig Dispense Refill  . acetaminophen  (TYLENOL) 500 MG tablet Take 1,000 mg by mouth every 6 (six) hours as needed for pain.      Marland Kitchen FLUoxetine (PROZAC) 20 MG capsule Take 20 mg by mouth daily.      Marland Kitchen loratadine (CLARITIN) 10 MG tablet Take 10 mg by mouth daily.      . Prenatal Vit-Fe Fumarate-FA (PRENATAL MULTIVITAMIN) TABS Take 1 tablet by mouth every morning.       . valACYclovir (VALTREX) 500 MG tablet Take 500 mg by mouth daily.         Review of Systems  Constitutional: Negative for fever and malaise/fatigue.  Gastrointestinal: Positive for nausea, vomiting and abdominal pain. Negative for diarrhea and constipation.  Genitourinary: Negative for dysuria, urgency and frequency.       Neg - vaginal bleeding + vaginal discharge   Physical Exam   Blood pressure 139/96, pulse 101, temperature 98.3 F (36.8 C), temperature source Oral, resp. rate 20, height 4\' 10"  (1.473 m), weight 198 lb 9.6 oz (90.084 kg), last menstrual period 01/28/2012, SpO2 99.00%.  Physical Exam  Constitutional: She is oriented to person, place, and time. She appears  well-developed and well-nourished. No distress.  HENT:  Head: Normocephalic and atraumatic.  Cardiovascular: Normal rate, regular rhythm and normal heart sounds.   Respiratory: Effort normal and breath sounds normal. No respiratory distress.  GI: Soft. Bowel sounds are normal. She exhibits no distension and no mass. There is tenderness (diffuse tenderness to palpation of the abdomen more prominent on the right side and lower abdomen). There is no rebound and no guarding.  Neurological: She is alert and oriented to person, place, and time.  Skin: Skin is warm and dry. No erythema.  Psychiatric: She has a normal mood and affect.   Results for orders placed during the hospital encounter of 09/09/12 (from the past 24 hour(s))  CBC     Status: Abnormal   Collection Time    09/09/12  5:27 AM      Result Value Range   WBC 12.1 (*) 4.0 - 10.5 K/uL   RBC 3.48 (*) 3.87 - 5.11 MIL/uL    Hemoglobin 10.4 (*) 12.0 - 15.0 g/dL   HCT 16.1 (*) 09.6 - 04.5 %   MCV 89.1  78.0 - 100.0 fL   MCH 29.9  26.0 - 34.0 pg   MCHC 33.5  30.0 - 36.0 g/dL   RDW 40.9  81.1 - 91.4 %   Platelets 269  150 - 400 K/uL  COMPREHENSIVE METABOLIC PANEL     Status: Abnormal   Collection Time    09/09/12  5:27 AM      Result Value Range   Sodium 135  135 - 145 mEq/L   Potassium 3.5  3.5 - 5.1 mEq/L   Chloride 102  96 - 112 mEq/L   CO2 23  19 - 32 mEq/L   Glucose, Bld 84  70 - 99 mg/dL   BUN 5 (*) 6 - 23 mg/dL   Creatinine, Ser 7.82 (*) 0.50 - 1.10 mg/dL   Calcium 9.5  8.4 - 95.6 mg/dL   Total Protein 5.7 (*) 6.0 - 8.3 g/dL   Albumin 2.1 (*) 3.5 - 5.2 g/dL   AST 25  0 - 37 U/L   ALT 10  0 - 35 U/L   Alkaline Phosphatase 125 (*) 39 - 117 U/L   Total Bilirubin 0.5  0.3 - 1.2 mg/dL   GFR calc non Af Amer >90  >90 mL/min   GFR calc Af Amer >90  >90 mL/min  URINALYSIS, ROUTINE W REFLEX MICROSCOPIC     Status: None   Collection Time    09/09/12  5:32 AM      Result Value Range   Color, Urine YELLOW  YELLOW   APPearance CLEAR  CLEAR   Specific Gravity, Urine 1.010  1.005 - 1.030   pH 7.0  5.0 - 8.0   Glucose, UA NEGATIVE  NEGATIVE mg/dL   Hgb urine dipstick NEGATIVE  NEGATIVE   Bilirubin Urine NEGATIVE  NEGATIVE   Ketones, ur NEGATIVE  NEGATIVE mg/dL   Protein, ur NEGATIVE  NEGATIVE mg/dL   Urobilinogen, UA 0.2  0.0 - 1.0 mg/dL   Nitrite NEGATIVE  NEGATIVE   Leukocytes, UA NEGATIVE  NEGATIVE   US Ob Limited  09/09/2012   *RADIOLOGY REPORT*  Clinical Data: Fall.  Abdominal pain.  Evaluate placenta.  LIMITED OBSTETRIC ULTRASOUND  Number of Fetuses: 1 Heart Rate: 132 bpm Movement: Present Breathing: Presentation: Cephalic Placental Location: Fundal Previa: None. Amniotic Fluid (Subjective): Normal.  Vertical Pocket 3.7 cm     AFI 10.05 cm  BPD:  7.8 cm  31 w  3 d         EDC:  11/08/2012  MATERNAL FINDINGS: Cervix: Normal.  Closed. Uterus/Adnexae:  Both ovaries are enlarged.  The right ovary  measures 10.7 x 7.2 x 9.8 cm.  The left ovary measures 7.6 x 5.5 x 4.1 cm.  This raises possibility polycystic ovarian disease.  IMPRESSION: Uncomplicated single intrauterine pregnancy.  Normal appearance of the placenta.  Recommend followup with non-emergent complete OB 14+ wk US examination for fetal biometric evaluation and anatomic survey if not already performed.   Original Report Authenticated By: Andreas Newport, M.D.    Fetal monitoring: Baseline: 125 bpm, moderate variability, + accelerations, no decelerations Contractions: UI with contractions q 2-5 minutes  MAU Course  Procedures None  MDM Discussed with Dr. Tenny Craw. CBC, CMP and [place] today Discussed results and NST with Dr. Tenny Craw. 10 mg procardia. Monitor contractions.  1610 - 10 mg Procardia given 0717 - Fetal monitoring shows no contractions and minimal uterine irritability. TOCO adjusted x 2 to ensure no contractions. Patient reports improvement in frequency and severity of contractions. Feels they are occasional now  Assessment and Plan  A: Preterm Contractions s/p fall  P: Discharge home Patient advised to call [facility] today prior to appointment time to ensure they want her seen today after evaluation was done in MAU this morning Patient advised to monitor contractions and kick counts Patient may return to MAU as needed  Freddi Starr, PA-C  09/09/2012, 7:34 AM

## 2012-09-09 NOTE — MAU Note (Signed)
Pt states she fell Sunday on her stomach. C/o of right side pain and some uc. Denies vaginal bleeding. States leaking a small amount of clear fluid on Sunday, but has not had any since. States good FM.

## 2012-09-22 ENCOUNTER — Encounter: Payer: Self-pay | Admitting: Obstetrics and Gynecology

## 2012-10-05 ENCOUNTER — Inpatient Hospital Stay (HOSPITAL_COMMUNITY)
Admission: AD | Admit: 2012-10-05 | Discharge: 2012-10-06 | Disposition: A | Discharge status: Home / Self Care | Payer: Medicaid Other | Source: Ambulatory Visit | Attending: Obstetrics and Gynecology | Admitting: Obstetrics and Gynecology

## 2012-10-05 ENCOUNTER — Encounter (HOSPITAL_COMMUNITY): Payer: Self-pay | Admitting: *Deleted

## 2012-10-05 ENCOUNTER — Inpatient Hospital Stay (HOSPITAL_COMMUNITY): Payer: Medicaid Other

## 2012-10-05 DIAGNOSIS — N390 Urinary tract infection, site not specified: Secondary | ICD-10-CM | POA: Insufficient documentation

## 2012-10-05 DIAGNOSIS — E860 Dehydration: Secondary | ICD-10-CM | POA: Insufficient documentation

## 2012-10-05 DIAGNOSIS — O139 Gestational [pregnancy-induced] hypertension without significant proteinuria, unspecified trimester: Secondary | ICD-10-CM | POA: Insufficient documentation

## 2012-10-05 DIAGNOSIS — O2390 Unspecified genitourinary tract infection in pregnancy, unspecified trimester: Secondary | ICD-10-CM | POA: Insufficient documentation

## 2012-10-05 DIAGNOSIS — O133 Gestational [pregnancy-induced] hypertension without significant proteinuria, third trimester: Secondary | ICD-10-CM

## 2012-10-05 DIAGNOSIS — O4700 False labor before 37 completed weeks of gestation, unspecified trimester: Secondary | ICD-10-CM | POA: Insufficient documentation

## 2012-10-05 DIAGNOSIS — O99891 Other specified diseases and conditions complicating pregnancy: Secondary | ICD-10-CM | POA: Insufficient documentation

## 2012-10-05 LAB — URINALYSIS, ROUTINE W REFLEX MICROSCOPIC
Ketones, ur: 15 mg/dL — AB
Nitrite: POSITIVE — AB
Protein, ur: >300 mg/dL — AB
pH: 6 (ref 5.0–8.0)

## 2012-10-05 LAB — URINE MICROSCOPIC-ADD ON

## 2012-10-05 LAB — CBC
MCH: 29.2 pg (ref 26.0–34.0)
MCHC: 33.2 g/dL (ref 30.0–36.0)
Platelets: 215 10*3/uL (ref 150–400)

## 2012-10-05 LAB — COMPREHENSIVE METABOLIC PANEL
ALT: 8 U/L (ref 0–35)
AST: 34 U/L (ref 0–37)
Albumin: 1.5 g/dL — ABNORMAL LOW (ref 3.5–5.2)
Calcium: 8.7 mg/dL (ref 8.4–10.5)
Sodium: 133 mEq/L — ABNORMAL LOW (ref 135–145)
Total Protein: 4.6 g/dL — ABNORMAL LOW (ref 6.0–8.3)

## 2012-10-05 LAB — POCT FERN TEST: POCT Fern Test: NEGATIVE — NL

## 2012-10-05 NOTE — MAU Note (Signed)
Not sure if she peed, had a coughing spell this morning- made a big spot on the bed and her gown was soaked, no further leaking the rest of the day.  Has been having contractions every 3-6 min.

## 2012-10-05 NOTE — MAU Provider Note (Signed)
History     CSN: [id number]  Arrival date and time: 10/05/12 1843   None     Chief Complaint  Patient presents with  . Labor Eval   HPI [name] is 27 y.o. G3P1011 [redacted]w[redacted]d weeks presenting with ? Rupture of membranes.  She states she had a leaking from her vagina that she thought was urine but her nightgown was wet half way up and her sheets were also wet.  She is having an occasional mild contraction.  She is a patient of Dr. Kittie Plater with [facility].     Past Medical History  Diagnosis Date  . Herpes genitalia   . HSV (herpes simplex virus) infection     last outbreak in may  . Mitral valve prolapse   . Status post repair of cor triatriatum     Echocardiogram 12/05: EF 55-65%, no mitral valve prolapse, mild RVE and no mention of any residual ASD.    Marland Kitchen Abnormal Pap smear   . Gestational diabetes   . Infection     UTI  . Anxiety     on meds    Past Surgical History  Procedure Laterality Date  . Cardiac surgery      congenital heart defect repair at 9 mos  . Exploration post operative open heart      Family History  Problem Relation Age of Onset  . Diabetes Mother   . Asthma Mother   . Diabetes Father   . Asthma Father   . Diabetes Maternal Grandmother   . Diabetes Paternal Grandfather     History  Substance Use Topics  . Smoking status: Former Games developer  . Smokeless tobacco: Never Used     Comment: quit 1st trimester  . Alcohol Use: Yes     Comment: occasional alcohol, not with preg    Allergies:  Allergies  Allergen Reactions  . Mucinex (Guaifenesin Er) Shortness Of Breath    Prescriptions prior to admission  Medication Sig Dispense Refill  . FLUoxetine (PROZAC) 20 MG capsule Take 20 mg by mouth daily.      Marland Kitchen loratadine (CLARITIN) 10 MG tablet Take 10 mg by mouth daily.      Marland Kitchen NIFEdipine (PROCARDIA) 10 MG capsule Take 10 mg by mouth daily as needed (contractions).      . phenylephrine (SUDAFED PE) 10 MG TABS Take 10 mg by mouth every  4 (four) hours as needed (congestion).      . Prenatal Vit-Fe Fumarate-FA (PRENATAL MULTIVITAMIN) TABS Take 1 tablet by mouth every morning.       . valACYclovir (VALTREX) 500 MG tablet Take 500 mg by mouth daily.         Review of Systems  Gastrointestinal: Negative for abdominal pain.  Genitourinary:       Neg for vaginal bleeding.     Physical Exam   Blood pressure 149/98, pulse 103, temperature 99.4 F (37.4 C), temperature source Oral, resp. rate 22, height 4\' 10"  (1.473 m), weight 209 lb (94.802 kg), last menstrual period 01/28/2012.  Physical Exam  Constitutional: She is oriented to person, place, and time. She appears well-developed and well-nourished. No distress.  HENT:  Head: Normocephalic.  Neck: Normal range of motion.  Cardiovascular: Normal rate.   Respiratory: Effort normal.  GI: Soft. She exhibits no distension and no mass. There is no tenderness. There is no rebound and no guarding.  Genitourinary: Uterus is enlarged. Uterus is not tender. Cervix exhibits no discharge and no friability. No erythema,  tenderness or bleeding around the vagina. No vaginal discharge found.  Cervical exam by Shimika, RN--1cm dilated and 80% effaced,    Vagina is negative for pooling of fluid.   Neurological: She is alert and oriented to person, place, and time.  Skin: Skin is warm and dry.  Psychiatric: She has a normal mood and affect. Her behavior is normal.   Results for orders placed during the hospital encounter of 10/05/12 (from the past 24 hour(s))  POCT FERN TEST     Status: None   Collection Time    10/05/12  8:01 PM      Result Value Range   POCT Fern Test Negative = intact amniotic membranes      MAU Course  Procedures  MDM 20:00  MSE, physical exam finding and fern results reported to Dr. Claiborne Billings.  Order given for AFI and BPP--strip not reactive at this time.   20:55 Care turned over to H. Mathews Robinsons, CNM 2230: Reviewed B/P, BPP/AFI and NST with Dr. Claiborne Billings. Will do  Pre-e labs at this time and continue to monitor B/P 0017: Reviewed labs with Dr. Claiborne Billings, creatinine of 1.15, and UTI. Patient is dehydrated. Will start 24 hour urine collection in AM, call office for b/p check and NST tomorrow, start Keflex and culture urine  US Ob Limited  10/05/2012   *RADIOLOGY REPORT*  Clinical Data: Possible premature rupture of membranes, nonreactive NST  LIMITED OBSTETRIC ULTRASOUND  Number of Fetuses: 1 Heart Rate: 155 bpm Movement: Present Presentation: Breech Placental Location: Fundal Previa: None Amniotic Fluid (Subjective): Normal  Vertical Pocket 6.4 cm     AFI 19.6 cm  (5%ile = 7.7 cm; 95%ile = 24.9 cm)  BPP:  8/8  BPD:  7.57 cm     30 w  3 d         EDC:  12/11/2012  MATERNAL FINDINGS: Cervix: Not evaluated Uterus/Adnexae:  The right ovary remains enlarged measuring approximately 9.8 x 3.9 x 9.0 cm (previously, 10.1 x 7.2 x 9.8 cm) and contains multiple peripheral anechoic cysts.  The left ovary is not visualized on the present examination.  IMPRESSION: 1.  Uncomplicated viable single intrauterine pregnancy.  BPP - 8/8  2.  Persistent enlargement of the right ovary which again raises the possibility of polycystic ovarian disease.  The left ovary was not visualized on the present examination.  Recommend followup with non-emergent complete OB 14+ wk US examination for fetal biometric evaluation and anatomic survey if not already performed.   Original Report Authenticated By: Tacey Ruiz, MD   US Fetal Bpp W/o Non Stress  10/05/2012   *RADIOLOGY REPORT*  Clinical Data: Possible premature rupture of membranes, nonreactive NST  LIMITED OBSTETRIC ULTRASOUND  Number of Fetuses: 1 Heart Rate: 155 bpm Movement: Present Presentation: Breech Placental Location: Fundal Previa: None Amniotic Fluid (Subjective): Normal  Vertical Pocket 6.4 cm     AFI 19.6 cm  (5%ile = 7.7 cm; 95%ile = 24.9 cm)  BPP:  8/8  BPD:  7.57 cm     30 w  3 d         EDC:  12/11/2012  MATERNAL FINDINGS: Cervix:  Not evaluated Uterus/Adnexae:  The right ovary remains enlarged measuring approximately 9.8 x 3.9 x 9.0 cm (previously, 10.1 x 7.2 x 9.8 cm) and contains multiple peripheral anechoic cysts.  The left ovary is not visualized on the present examination.  IMPRESSION: 1.  Uncomplicated viable single intrauterine pregnancy.  BPP - 8/8  2.  Persistent  enlargement of the right ovary which again raises the possibility of polycystic ovarian disease.  The left ovary was not visualized on the present examination.  Recommend followup with non-emergent complete OB 14+ wk US examination for fetal biometric evaluation and anatomic survey if not already performed.   Original Report Authenticated By: Tacey Ruiz, MD     Assessment and Plan  Gestational Hypertension UTI  RX: Keflex 500 QID X 7 NST and B/P check in the office tomorrow Start 24 hour urine Fetal kick counts Pre-eclampsia danger signs reviewed    KEY,EVE M 10/05/2012, 7:51 PM

## 2012-10-06 ENCOUNTER — Encounter (HOSPITAL_COMMUNITY): Payer: Self-pay | Admitting: Anesthesiology

## 2012-10-06 ENCOUNTER — Inpatient Hospital Stay (HOSPITAL_COMMUNITY): Payer: Medicaid Other | Admitting: Anesthesiology

## 2012-10-06 ENCOUNTER — Encounter (HOSPITAL_COMMUNITY)
Admission: AD | Disposition: A | Discharge status: Home / Self Care | Payer: Self-pay | Source: Ambulatory Visit | Attending: Obstetrics and Gynecology

## 2012-10-06 ENCOUNTER — Inpatient Hospital Stay (HOSPITAL_COMMUNITY): Payer: Medicaid Other

## 2012-10-06 ENCOUNTER — Inpatient Hospital Stay (HOSPITAL_COMMUNITY)
Admission: AD | Admit: 2012-10-06 | Discharge: 2012-10-11 | DRG: 765 | Disposition: A | Discharge status: Home / Self Care | Payer: Medicaid Other | Source: Ambulatory Visit | Attending: Obstetrics and Gynecology | Admitting: Obstetrics and Gynecology

## 2012-10-06 ENCOUNTER — Encounter (HOSPITAL_COMMUNITY): Payer: Self-pay | Admitting: *Deleted

## 2012-10-06 DIAGNOSIS — K661 Hemoperitoneum: Secondary | ICD-10-CM | POA: Diagnosis not present

## 2012-10-06 DIAGNOSIS — O99814 Abnormal glucose complicating childbirth: Secondary | ICD-10-CM | POA: Diagnosis present

## 2012-10-06 DIAGNOSIS — O2390 Unspecified genitourinary tract infection in pregnancy, unspecified trimester: Secondary | ICD-10-CM | POA: Diagnosis present

## 2012-10-06 DIAGNOSIS — O365990 Maternal care for other known or suspected poor fetal growth, unspecified trimester, not applicable or unspecified: Secondary | ICD-10-CM | POA: Diagnosis present

## 2012-10-06 DIAGNOSIS — O479 False labor, unspecified: Secondary | ICD-10-CM

## 2012-10-06 DIAGNOSIS — O4290 Premature rupture of membranes, unspecified as to length of time between rupture and onset of labor, unspecified weeks of gestation: Secondary | ICD-10-CM | POA: Diagnosis present

## 2012-10-06 DIAGNOSIS — N390 Urinary tract infection, site not specified: Secondary | ICD-10-CM

## 2012-10-06 DIAGNOSIS — O4590 Premature separation of placenta, unspecified, unspecified trimester: Principal | ICD-10-CM | POA: Diagnosis present

## 2012-10-06 DIAGNOSIS — O321XX0 Maternal care for breech presentation, not applicable or unspecified: Secondary | ICD-10-CM | POA: Diagnosis present

## 2012-10-06 DIAGNOSIS — O10019 Pre-existing essential hypertension complicating pregnancy, unspecified trimester: Secondary | ICD-10-CM

## 2012-10-06 LAB — CBC
HCT: 23.6 % — ABNORMAL LOW (ref 36.0–46.0)
Hemoglobin: 11.8 g/dL — ABNORMAL LOW (ref 12.0–15.0)
Hemoglobin: 8.2 g/dL — ABNORMAL LOW (ref 12.0–15.0)
MCH: 29.2 pg (ref 26.0–34.0)
MCH: 29.7 pg (ref 26.0–34.0)
MCHC: 34.7 g/dL (ref 30.0–36.0)
MCV: 86.4 fL (ref 78.0–100.0)
MCV: 86.5 fL (ref 78.0–100.0)
MCV: 85.5 fL (ref 78.0–100.0)
Platelets: 192 10*3/uL (ref 150–400)
Platelets: 200 10*3/uL (ref 150–400)
RBC: 4.04 MIL/uL (ref 3.87–5.11)
RBC: 2.76 MIL/uL — ABNORMAL LOW (ref 3.87–5.11)
RDW: 15.3 % (ref 11.5–15.5)
RDW: 15.2 % (ref 11.5–15.5)
WBC: 16.1 10*3/uL — ABNORMAL HIGH (ref 4.0–10.5)
WBC: 15.2 10*3/uL — ABNORMAL HIGH (ref 4.0–10.5)

## 2012-10-06 LAB — COMPREHENSIVE METABOLIC PANEL
Alkaline Phosphatase: 153 U/L — ABNORMAL HIGH (ref 39–117)
BUN: 17 mg/dL (ref 6–23)
Chloride: 98 mEq/L (ref 96–112)
GFR calc Af Amer: 81 mL/min — ABNORMAL LOW (ref >90)
Glucose, Bld: 94 mg/dL (ref 70–99)
Potassium: 4.1 mEq/L (ref 3.5–5.1)
Total Bilirubin: 0.8 mg/dL (ref 0.3–1.2)

## 2012-10-06 LAB — GLUCOSE, CAPILLARY

## 2012-10-06 LAB — RPR: RPR Ser Ql: NONREACTIVE

## 2012-10-06 SURGERY — Surgical Case
Anesthesia: Spinal | Site: Abdomen | Wound class: Clean Contaminated

## 2012-10-06 MED ORDER — NALBUPHINE HCL 10 MG/ML IJ SOLN
5.0000 mg | INTRAMUSCULAR | Status: DC | PRN
  Filled 2012-10-06: qty 1

## 2012-10-06 MED ORDER — ONDANSETRON HCL 4 MG/2ML IJ SOLN
4.0000 mg | Freq: Three times a day (TID) | INTRAMUSCULAR | Status: DC | PRN
  Filled 2012-10-06: qty 2

## 2012-10-06 MED ORDER — DIPHENHYDRAMINE HCL 25 MG PO CAPS
25.0000 mg | ORAL_CAPSULE | ORAL | Status: DC | PRN
  Administered 2012-10-06: 25 mg via ORAL
  Filled 2012-10-06: qty 1

## 2012-10-06 MED ORDER — CEPHALEXIN 500 MG PO CAPS: 500.0000 mg | ORAL_CAPSULE | Freq: Four times a day (QID) | ORAL | Status: DC

## 2012-10-06 MED ORDER — FAMOTIDINE IN NACL 20-0.9 MG/50ML-% IV SOLN: 20.0000 mg | Freq: Once | INTRAVENOUS | Status: AC

## 2012-10-06 MED ORDER — MEPERIDINE HCL 25 MG/ML IJ SOLN: 6.2500 mg | INTRAMUSCULAR | Status: DC | PRN

## 2012-10-06 MED ORDER — LANOLIN HYDROUS EX OINT: 1.0000 "application " | TOPICAL_OINTMENT | CUTANEOUS | Status: DC | PRN

## 2012-10-06 MED ORDER — FLUOXETINE HCL 20 MG PO CAPS
20.0000 mg | ORAL_CAPSULE | Freq: Every day | ORAL | Status: DC
  Administered 2012-10-06 – 2012-10-11 (×4): 20 mg via ORAL
  Filled 2012-10-06 (×7): qty 1

## 2012-10-06 MED ORDER — LACTATED RINGERS IV SOLN
INTRAVENOUS | Status: DC
  Administered 2012-10-06 (×3): via INTRAVENOUS

## 2012-10-06 MED ORDER — DIPHENHYDRAMINE HCL 50 MG/ML IJ SOLN: 25.0000 mg | INTRAMUSCULAR | Status: DC | PRN

## 2012-10-06 MED ORDER — LACTATED RINGERS IV BOLUS (SEPSIS)
1000.0000 mL | Freq: Once | INTRAVENOUS | Status: AC
  Administered 2012-10-06: 1000 mL via INTRAVENOUS

## 2012-10-06 MED ORDER — KETOROLAC TROMETHAMINE 30 MG/ML IJ SOLN: 30.0000 mg | Freq: Four times a day (QID) | INTRAMUSCULAR | Status: AC | PRN

## 2012-10-06 MED ORDER — BUPIVACAINE IN DEXTROSE 0.75-8.25 % IT SOLN
INTRATHECAL | Status: DC | PRN
  Administered 2012-10-06: 10 mg via INTRATHECAL

## 2012-10-06 MED ORDER — KETOROLAC TROMETHAMINE 30 MG/ML IJ SOLN
INTRAMUSCULAR | Status: AC
  Administered 2012-10-06: 30 mg via INTRAVENOUS
  Filled 2012-10-06: qty 1

## 2012-10-06 MED ORDER — DIPHENHYDRAMINE HCL 50 MG/ML IJ SOLN: 12.5000 mg | INTRAMUSCULAR | Status: DC | PRN

## 2012-10-06 MED ORDER — ONDANSETRON HCL 4 MG/2ML IJ SOLN
INTRAMUSCULAR | Status: DC | PRN
  Administered 2012-10-06: 4 mg via INTRAVENOUS

## 2012-10-06 MED ORDER — OXYTOCIN 40 UNITS IN LACTATED RINGERS INFUSION - SIMPLE MED: 62.5000 mL/h | INTRAVENOUS | Status: AC

## 2012-10-06 MED ORDER — SENNOSIDES-DOCUSATE SODIUM 8.6-50 MG PO TABS
2.0000 | ORAL_TABLET | Freq: Every day | ORAL | Status: DC
  Administered 2012-10-06 – 2012-10-10 (×4): 2 via ORAL

## 2012-10-06 MED ORDER — DIBUCAINE 1 % RE OINT: 1.0000 "application " | TOPICAL_OINTMENT | RECTAL | Status: DC | PRN

## 2012-10-06 MED ORDER — CEFAZOLIN SODIUM-DEXTROSE 2-3 GM-% IV SOLR
2.0000 g | Freq: Once | INTRAVENOUS | Status: AC
  Administered 2012-10-06: 2 g via INTRAVENOUS

## 2012-10-06 MED ORDER — MORPHINE SULFATE (PF) 0.5 MG/ML IJ SOLN
INTRAMUSCULAR | Status: DC | PRN
  Administered 2012-10-06: .15 mg via INTRATHECAL

## 2012-10-06 MED ORDER — FENTANYL CITRATE 0.05 MG/ML IJ SOLN
INTRAMUSCULAR | Status: DC | PRN
  Administered 2012-10-06: 25 ug via INTRATHECAL

## 2012-10-06 MED ORDER — ONDANSETRON HCL 4 MG/2ML IJ SOLN
4.0000 mg | INTRAMUSCULAR | Status: DC | PRN
  Administered 2012-10-07: 4 mg via INTRAVENOUS

## 2012-10-06 MED ORDER — SCOPOLAMINE 1 MG/3DAYS TD PT72: 1.0000 | MEDICATED_PATCH | Freq: Once | TRANSDERMAL | Status: AC

## 2012-10-06 MED ORDER — SODIUM CHLORIDE 0.9 % IJ SOLN: 3.0000 mL | INTRAMUSCULAR | Status: DC | PRN

## 2012-10-06 MED ORDER — FENTANYL CITRATE 0.05 MG/ML IJ SOLN: 25.0000 ug | INTRAMUSCULAR | Status: DC | PRN

## 2012-10-06 MED ORDER — CEPHALEXIN 500 MG PO CAPS
500.0000 mg | ORAL_CAPSULE | Freq: Four times a day (QID) | ORAL | Status: DC
  Administered 2012-10-06 (×4): 500 mg via ORAL
  Filled 2012-10-06 (×6): qty 1

## 2012-10-06 MED ORDER — OXYCODONE-ACETAMINOPHEN 5-325 MG PO TABS
1.0000 | ORAL_TABLET | ORAL | Status: DC | PRN
  Administered 2012-10-06 (×2): 1 via ORAL
  Administered 2012-10-07 – 2012-10-09 (×2): 2 via ORAL
  Administered 2012-10-09 – 2012-10-10 (×3): 1 via ORAL
  Administered 2012-10-10: 2 via ORAL
  Administered 2012-10-10: 1 via ORAL
  Administered 2012-10-11: 2 via ORAL
  Administered 2012-10-11: 1 via ORAL
  Filled 2012-10-06: qty 1
  Filled 2012-10-06: qty 2
  Filled 2012-10-06 (×4): qty 1
  Filled 2012-10-06: qty 2
  Filled 2012-10-06: qty 1
  Filled 2012-10-06: qty 2
  Filled 2012-10-06: qty 1
  Filled 2012-10-06: qty 2

## 2012-10-06 MED ORDER — MENTHOL 3 MG MT LOZG
1.0000 | LOZENGE | OROMUCOSAL | Status: DC | PRN
  Administered 2012-10-08: 3 mg via ORAL
  Filled 2012-10-06: qty 9

## 2012-10-06 MED ORDER — METOCLOPRAMIDE HCL 5 MG/ML IJ SOLN: 10.0000 mg | Freq: Three times a day (TID) | INTRAMUSCULAR | Status: DC | PRN

## 2012-10-06 MED ORDER — LACTATED RINGERS IV SOLN
INTRAVENOUS | Status: DC | PRN
  Administered 2012-10-06: 04:00:00 via INTRAVENOUS

## 2012-10-06 MED ORDER — SIMETHICONE 80 MG PO CHEW
80.0000 mg | CHEWABLE_TABLET | Freq: Three times a day (TID) | ORAL | Status: DC
  Administered 2012-10-06 – 2012-10-11 (×13): 80 mg via ORAL

## 2012-10-06 MED ORDER — FAMOTIDINE IN NACL 20-0.9 MG/50ML-% IV SOLN
INTRAVENOUS | Status: AC
  Administered 2012-10-06: 20 mg via INTRAVENOUS
  Filled 2012-10-06: qty 50

## 2012-10-06 MED ORDER — ACETAMINOPHEN 10 MG/ML IV SOLN
1000.0000 mg | Freq: Four times a day (QID) | INTRAVENOUS | Status: AC | PRN
  Filled 2012-10-06: qty 100

## 2012-10-06 MED ORDER — WITCH HAZEL-GLYCERIN EX PADS: 1.0000 "application " | MEDICATED_PAD | CUTANEOUS | Status: DC | PRN

## 2012-10-06 MED ORDER — OXYTOCIN 10 UNIT/ML IJ SOLN
40.0000 [IU] | INTRAVENOUS | Status: DC | PRN
  Administered 2012-10-06: 40 [IU] via INTRAVENOUS

## 2012-10-06 MED ORDER — SIMETHICONE 80 MG PO CHEW: 80.0000 mg | CHEWABLE_TABLET | ORAL | Status: DC | PRN

## 2012-10-06 MED ORDER — FENTANYL CITRATE 0.05 MG/ML IJ SOLN
INTRAMUSCULAR | Status: DC | PRN
  Administered 2012-10-06: 75 ug via INTRAVENOUS

## 2012-10-06 MED ORDER — ONDANSETRON HCL 4 MG PO TABS
4.0000 mg | ORAL_TABLET | ORAL | Status: DC | PRN
  Administered 2012-10-09 – 2012-10-11 (×3): 4 mg via ORAL
  Filled 2012-10-06 (×3): qty 1

## 2012-10-06 MED ORDER — DIPHENHYDRAMINE HCL 25 MG PO CAPS: 25.0000 mg | ORAL_CAPSULE | Freq: Four times a day (QID) | ORAL | Status: DC | PRN

## 2012-10-06 MED ORDER — CITRIC ACID-SODIUM CITRATE 334-500 MG/5ML PO SOLN
ORAL | Status: AC
  Filled 2012-10-06: qty 15

## 2012-10-06 MED ORDER — PROMETHAZINE HCL 25 MG/ML IJ SOLN: 6.2500 mg | INTRAMUSCULAR | Status: DC | PRN

## 2012-10-06 MED ORDER — MIDAZOLAM HCL 2 MG/2ML IJ SOLN: 0.5000 mg | Freq: Once | INTRAMUSCULAR | Status: DC | PRN

## 2012-10-06 MED ORDER — TETANUS-DIPHTH-ACELL PERTUSSIS 5-2.5-18.5 LF-MCG/0.5 IM SUSP
0.5000 mL | Freq: Once | INTRAMUSCULAR | Status: AC
  Administered 2012-10-09: 0.5 mL via INTRAMUSCULAR
  Filled 2012-10-06: qty 0.5

## 2012-10-06 MED ORDER — SCOPOLAMINE 1 MG/3DAYS TD PT72
MEDICATED_PATCH | TRANSDERMAL | Status: AC
  Administered 2012-10-06: 1.5 mg via TRANSDERMAL
  Filled 2012-10-06: qty 1

## 2012-10-06 MED ORDER — NALOXONE HCL 0.4 MG/ML IJ SOLN: 0.4000 mg | INTRAMUSCULAR | Status: DC | PRN

## 2012-10-06 MED ORDER — PRENATAL MULTIVITAMIN CH
1.0000 | ORAL_TABLET | Freq: Every day | ORAL | Status: DC
  Administered 2012-10-06 – 2012-10-11 (×4): 1 via ORAL
  Filled 2012-10-06 (×4): qty 1

## 2012-10-06 MED ORDER — LACTATED RINGERS IV SOLN
INTRAVENOUS | Status: DC
  Administered 2012-10-06 (×2): via INTRAVENOUS
  Administered 2012-10-06 – 2012-10-07 (×2): 125 mL/h via INTRAVENOUS

## 2012-10-06 MED ORDER — IBUPROFEN 600 MG PO TABS
600.0000 mg | ORAL_TABLET | Freq: Four times a day (QID) | ORAL | Status: DC
  Administered 2012-10-06 – 2012-10-11 (×15): 600 mg via ORAL
  Filled 2012-10-06 (×15): qty 1

## 2012-10-06 MED ORDER — 0.9 % SODIUM CHLORIDE (POUR BTL) OPTIME
TOPICAL | Status: DC | PRN
  Administered 2012-10-06: 300 mL

## 2012-10-06 MED ORDER — CITRIC ACID-SODIUM CITRATE 334-500 MG/5ML PO SOLN
30.0000 mL | Freq: Once | ORAL | Status: AC
  Administered 2012-10-06: 30 mL via ORAL

## 2012-10-06 MED ORDER — EPHEDRINE SULFATE 50 MG/ML IJ SOLN
INTRAMUSCULAR | Status: DC | PRN
  Administered 2012-10-06 (×2): 10 mg via INTRAVENOUS

## 2012-10-06 MED ORDER — NALOXONE HCL 1 MG/ML IJ SOLN: 1.0000 ug/kg/h | INTRAVENOUS | Status: DC | PRN

## 2012-10-06 MED ORDER — FUROSEMIDE 10 MG/ML IJ SOLN
20.0000 mg | Freq: Once | INTRAMUSCULAR | Status: AC
  Administered 2012-10-06: 20 mg via INTRAVENOUS
  Filled 2012-10-06: qty 2

## 2012-10-06 MED ORDER — ZOLPIDEM TARTRATE 5 MG PO TABS: 5.0000 mg | ORAL_TABLET | Freq: Every evening | ORAL | Status: DC | PRN

## 2012-10-06 SURGICAL SUPPLY — 30 items
CLAMP CORD UMBIL (MISCELLANEOUS) ×1 IMPLANT
CLOTH BEACON ORANGE TIMEOUT ST (SAFETY) ×2 IMPLANT
DRAPE LG THREE QUARTER DISP (DRAPES) ×2 IMPLANT
DRSG OPSITE POSTOP 4X10 (GAUZE/BANDAGES/DRESSINGS) ×2 IMPLANT
DURAPREP 26ML APPLICATOR (WOUND CARE) ×2 IMPLANT
ELECT REM PT RETURN 9FT ADLT (ELECTROSURGICAL) ×2
ELECTRODE REM PT RTRN 9FT ADLT (ELECTROSURGICAL) ×1 IMPLANT
EXTRACTOR VACUUM M CUP 4 TUBE (SUCTIONS) IMPLANT
GLOVE BIO SURGEON STRL SZ 6.5 (GLOVE) ×2 IMPLANT
GLOVE BIOGEL PI IND STRL 6.5 (GLOVE) ×1 IMPLANT
GLOVE BIOGEL PI INDICATOR 6.5 (GLOVE) ×1
GOWN STRL REIN XL XLG (GOWN DISPOSABLE) ×4 IMPLANT
KIT ABG SYR 3ML LUER SLIP (SYRINGE) IMPLANT
NDL HYPO 25X5/8 SAFETYGLIDE (NEEDLE) IMPLANT
NEEDLE HYPO 25X5/8 SAFETYGLIDE (NEEDLE) IMPLANT
NS IRRIG 1000ML POUR BTL (IV SOLUTION) ×2 IMPLANT
PACK C SECTION WH (CUSTOM PROCEDURE TRAY) ×2 IMPLANT
PAD OB MATERNITY 4.3X12.25 (PERSONAL CARE ITEMS) ×2 IMPLANT
RTRCTR C-SECT PINK 25CM LRG (MISCELLANEOUS) ×2 IMPLANT
STAPLER VISISTAT 35W (STAPLE) ×1 IMPLANT
SUT MON AB 2-0 CT1 27 (SUTURE) ×2 IMPLANT
SUT MON AB 4-0 PS1 27 (SUTURE) IMPLANT
SUT PDS AB 0 CTX 60 (SUTURE) IMPLANT
SUT PLAIN 2 0 XLH (SUTURE) IMPLANT
SUT VIC AB 0 CTX 36 (SUTURE) ×8
SUT VIC AB 0 CTX36XBRD ANBCTRL (SUTURE) ×4 IMPLANT
SUT VIC AB 4-0 KS 27 (SUTURE) ×1 IMPLANT
TOWEL OR 17X24 6PK STRL BLUE (TOWEL DISPOSABLE) ×6 IMPLANT
TRAY FOLEY CATH 14FR (SET/KITS/TRAYS/PACK) ×2 IMPLANT
WATER STERILE IRR 1000ML POUR (IV SOLUTION) ×2 IMPLANT

## 2012-10-06 NOTE — Anesthesia Postprocedure Evaluation (Signed)
Anesthesia Post Note  Patient: [name]  Procedure(s) Performed: Procedure(s) (LRB): Primary CESAREAN SECTION  of baby girl at 66 APGAR 8/8 (N/A)  Anesthesia type: SAB  Patient location: Mother/Baby  Post pain: Pain level controlled  Post assessment: Post-op Vital signs reviewed  Last Vitals:  Filed Vitals:   10/06/12 1200  BP: 132/86  Pulse: 94  Temp: 36.6 C  Resp: 18    Post vital signs: Reviewed  Level of consciousness: awake  Complications: No apparent anesthesia complications

## 2012-10-06 NOTE — MAU Note (Signed)
Breech presentation confirmed by bedside ultrasound.

## 2012-10-06 NOTE — Progress Notes (Signed)
POD#0 Pt stable. On abx for UTI. Labs pending.

## 2012-10-06 NOTE — Brief Op Note (Signed)
10/06/2012  4:57 AM  PATIENT:  [name]  27 y.o. female  PRE-OPERATIVE DIAGNOSIS:  breech, ruptured, in labor  POST-OPERATIVE DIAGNOSIS:  breech, ruptured, in labor, possible abruption of enlarged placenta  PROCEDURE:  Procedure(s): Primary CESAREAN SECTION  of baby girl at 0347 APGAR 8/8 (N/A)  SURGEON:  Surgeon(s) and Role:    * Philip Aspen, DO - Primary  ANESTHESIA:   spinal  EBL:  Total I/O In: 3300 [I.V.:3300] Out: 1100 [Urine:100; Blood:1000]  FINDINGS: female infant, frank breech presentation, thick, unclotted dark blood upon entry into uterus, extremely enlarged placenta, bilaterally enlarged ovaries with cystic appearance, normal tubes bilaterally.  SPECIMEN:  Source of Specimen:  placenta and cord blood  DISPOSITION OF SPECIMEN:  PATHOLOGY  COUNTS:  YES  PATIENT DISPOSITION:  PACU - hemodynamically stable.

## 2012-10-06 NOTE — Op Note (Signed)
NAME:  [name], [name] NO.:  [id number]  MEDICAL RECORD NO.:  [id number]  LOCATION:  [facility]                          FACILITY:  [facility]  PHYSICIAN:  Philip Aspen, DO    DATE OF BIRTH:  04/09/1985  DATE OF PROCEDURE: DATE OF DISCHARGE:                              OPERATIVE REPORT   PREOPERATIVE DIAGNOSIS:  A 35-week ruptured membranes, breech presentation in labor.  POSTOPERATIVE DIAGNOSIS:  A 35-week ruptured membranes, breech presentation in labor, in addition possible abruption of enlarged placenta.  PROCEDURE:  Low-transverse cesarean section.  SURGEON:  Philip Aspen, DO  ANESTHESIA:  Spinal.  ESTIMATED BLOOD LOSS:  1000 mL.  URINE OUTPUT:  100 mL.  IV FLUIDS:  3300 mL.  FINDING:  Female infant in frank breech presentation with thick unclotted dark blood upon entry into the uterus, extremely enlarged placenta, sent to pathology, and bilaterally enlarged ovaries with cystic appearance, normal tubes bilaterally.  In addition, Apgars of 8 and 8 were found.  SPECIMENS:  Placenta and cord blood to pathology.  COMPLICATIONS:  None.  CONDITION:  Stable to PACU.  DESCRIPTION OF PROCEDURE:  The patient was taken to the operating room emergently.  Cervix was noted to be 7 cm dilated, in breech presentation.  She was prepped and draped in the normal sterile fashion in dorsal supine position with a leftward tilt.  FHT found to be in the 90s to 100s.  Pfannenstiel skin incision made with scalpel and  carried down to the underlying layer of fascia with the scalpel, which was incised and extended laterally with Mayo scissors.  Subcutaneous tissue was bluntly dissected.  Clamps were placed superiorly at the superior aspect and the inferior aspect of the fascial incision.  Rectus muscles were separated bluntly, and the peritoneum was entered bluntly and extended laterally by manual traction.  Alexis self-retractor was placed, and uterine incision was made,  low transverse.  The infant was easily delivered through the uterine incision with grasping of the hips, delivery of the legs, and suction of the head.  The cord was clamped and cut and the infant was handed off to awaiting neonatology.  Segment of cord was set aside to obtain a blood gas.  Cord blood was collected, and gentle traction was placed on the umbilical cord with manual massage of the external uterus. Significant dark blood, which was unclotted, began to pour from the uteroplacental interface.  The placenta was manually extracted and found to be extremely large with estimate being at least 4 times the typical size.  After delivery of the placenta, the uterus contracted down well. It was cleared of all clot and debris, and the uterine incision was closed with Vicryl in a running fashion, followed by a second layer of horizontal Lembert imbrication, both layers with Vicryl.  Excellent hemostasis was noted.  The Alexis uterine self-retractor was removed. The ovaries were examined and found to be enlarged and cystic as noted above.  Blood and clot was removed from the gutter.  Peritoneum was then grasped and closed with Monocryl in a running fashion from superior to inferior followed by inferior to superior reapproximation and closure of the rectus muscles with the same suture.  Again, excellent  hemostasis was still noted.  The fascia was reapproximated and closed with Vicryl in a running fashion.  The subcutaneous tissue was irrigated, dried, and found to be hemostatic with minimal use of Bovie cautery.  The skin was reapproximated and closed with staples.  The patient tolerated the procedure well.  Sponge, lap, and needle counts were correct x2.  The patient was taken to recovery in stable condition.          ______________________________ Philip Aspen, DO     [place]/MEDQ  D:  10/06/2012  T:  10/06/2012  Job:  [id number]

## 2012-10-06 NOTE — Transfer of Care (Signed)
Immediate Anesthesia Transfer of Care Note  Patient: [name]  Procedure(s) Performed: Procedure(s): Primary CESAREAN SECTION  of baby girl at 2 APGAR 8/8 (N/A)  Patient Location: PACU  Anesthesia Type:Spinal  Level of Consciousness: awake  Airway & Oxygen Therapy: Patient Spontanous Breathing  Post-op Assessment: Report given to PACU RN and Post -op Vital signs reviewed and stable  Post vital signs: stable  Complications: No apparent anesthesia complications

## 2012-10-06 NOTE — Progress Notes (Signed)
Ur chart review completed.  

## 2012-10-06 NOTE — OR Nursing (Signed)
100 ml blood loss during fundal massage by DLWegner RN, cord blood x 2 to front desk

## 2012-10-06 NOTE — Anesthesia Procedure Notes (Signed)
Spinal  Patient location during procedure: OR Start time: 10/06/2012 3:40 AM Staffing Anesthesiologist: Angus Seller., Harrell Gave. Performed by: anesthesiologist  Preanesthetic Checklist Completed: patient identified, site marked, surgical consent, pre-op evaluation, timeout performed, IV checked, risks and benefits discussed and monitors and equipment checked Spinal Block Patient position: sitting Prep: DuraPrep Patient monitoring: heart rate, cardiac monitor, continuous pulse ox and blood pressure Approach: midline Location: L3-4 Injection technique: single-shot Needle Needle type: Sprotte  Needle gauge: 24 G Needle length: 9 cm Assessment Sensory level: T4 Additional Notes Patient identified.  Risk benefits discussed including failed block, incomplete pain control, headache, nerve damage, paralysis, blood pressure changes, nausea, vomiting, reactions to medication both toxic or allergic, and postpartum back pain.  Patient expressed understanding and wished to proceed.  All questions were answered.  Sterile technique used throughout procedure.  CSF was clear.  No parasthesia or other complications.  Please see nursing notes for vital signs.

## 2012-10-06 NOTE — Anesthesia Preprocedure Evaluation (Addendum)
Anesthesia Evaluation  Patient identified by MRN, date of birth, ID band Patient awake    Reviewed: Allergy & Precautions, H&P , NPO status , Patient's Chart, lab work & pertinent test results  Airway Mallampati: III      Dental no notable dental hx.    Pulmonary neg pulmonary ROS,  breath sounds clear to auscultation  Pulmonary exam normal       Cardiovascular Exercise Tolerance: Good hypertension, negative cardio ROS  + dysrhythmias Rhythm:regular Rate:Normal     Neuro/Psych negative neurological ROS  negative psych ROS   GI/Hepatic negative GI ROS, Neg liver ROS,   Endo/Other  negative endocrine ROSdiabetesMorbid obesity  Renal/GU negative Renal ROS  negative genitourinary   Musculoskeletal   Abdominal Normal abdominal exam  (+)   Peds  Hematology negative hematology ROS (+)   Anesthesia Other Findings Herpes genitalia     HSV (herpes simplex virus) infection   last outbreak in may    Mitral valve prolapse     Status post repair of cor triatriatum   Echocardiogram 12/05: EF 55-65%, no mitral valve prolapse, mild RVE and no mention of any residual ASD.      Abnormal Pap smear     Gestational diabetes        Infection   UTI Anxiety   on meds    Reproductive/Obstetrics (+) Pregnancy                          Anesthesia Physical Anesthesia Plan  ASA: III and emergent  Anesthesia Plan: Spinal   Post-op Pain Management:    Induction:   Airway Management Planned:   Additional Equipment:   Intra-op Plan:   Post-operative Plan:   Informed Consent: I have reviewed the patients History and Physical, chart, labs and discussed the procedure including the risks, benefits and alternatives for the proposed anesthesia with the patient or authorized representative who has indicated his/her understanding and acceptance.     Plan Discussed with: Anesthesiologist, CRNA and Surgeon  Anesthesia  Plan Comments:         Anesthesia Quick Evaluation

## 2012-10-06 NOTE — H&P (Addendum)
27 y.o. [redacted]w[redacted]d  [id number] comes in c/o ROM approx 2am.  Otherwise has good fetal movement and no bleeding.  Pt was seen earlier in the night for possible ROM yesterday morning with no leaking since, was discharged after no pooling or ferning noted, and AFI approx 19cm.  NST was not reactive and BPP performed was 8/8. Pt denied HA/vision change/RUQ pain, one severe range BP was obtained, otherwise mild range BPs that have also been noted in the office. Urine showed>300 prot, +nitrites, culture was sent.  She was discharged home with Keflex and 24hr urine collection, with instruction to get repeat BP check and NST in office today.  Upon arrival at approx 2:30am, ctx started becoming more painful and bedside US performed for presentation.  Pt found to be breech and 7cm.  Decision was made to bring her to the OR emergently.  Once in OR, FHT were found to be 90-100s.  Past Medical History  Diagnosis Date  . Herpes genitalia   . HSV (herpes simplex virus) infection     last outbreak in may  . Mitral valve prolapse   . Status post repair of cor triatriatum     Echocardiogram 12/05: EF 55-65%, no mitral valve prolapse, mild RVE and no mention of any residual ASD.    Marland Kitchen Abnormal Pap smear   . Gestational diabetes   . Infection     UTI  . Anxiety     on meds    Past Surgical History  Procedure Laterality Date  . Cardiac surgery      congenital heart defect repair at 9 mos  . Exploration post operative open heart      OB History   Grav Para Term Preterm Abortions TAB SAB Ect Mult Living   3 2 1 1 1  0 1 0 0 2     # Outc Date GA Lbr Len/2nd Wgt Sex Del Anes PTL Lv   1 TRM 7/12 [redacted]w[redacted]d 21:05 / 01:46 4.540JW([id number]) M VAC EPI  Yes   Comments: caput   2 PRE 7/14 [redacted]w[redacted]d 00:00  F LTCS Spinal  Yes   3 SAB               History   Social History  . Marital Status: Married    Spouse Name: N/A    Number of Children: N/A  . Years of Education: N/A   Occupational History  . Not on file.   Social History  Main Topics  . Smoking status: Former Games developer  . Smokeless tobacco: Never Used     Comment: quit 1st trimester  . Alcohol Use: Yes     Comment: occasional alcohol, not with preg  . Drug Use: No  . Sexually Active: Yes    Birth Control/ Protection: None   Other Topics Concern  . Not on file   Social History Narrative  . No narrative on file   Mucinex    Prenatal Transfer Tool  Maternal Diabetes: Yes:  Diabetes Type:  Diet controlled Genetic Screening: Normal Maternal Ultrasounds/Referrals: Normal Fetal Ultrasounds or other Referrals:  Fetal echo Maternal Substance Abuse:  No Significant Maternal Medications:  Meds include: Other:  valtrex Significant Maternal Lab Results: None  Other PNC: HSV, GDMA1, pt h/o ASD s/p repair at 61mo old.  Fetal echo wnl.    Filed Vitals:   10/06/12 0435  BP:   Pulse: 90  Temp: 97.4 F (36.3 C)  Resp:      Lungs/Cor:  NAD Abdomen:  soft, gravid Ex:  no cords, erythema SVE:  7 FHTs:  140, min variability, with some short periods of moderate variability Toco:  q2   A/P   Admitted for c/s for PPROM/breech; elevated creatinine of 1.15, elevated BPs (severe range x 1), GDMA1 with good control. Routine pre-op care, 2g Ancef Elevated BPs: labs from last night 10pm: normal ALT/AST/Plt.  Cr elevated, will repeat this am and follow BPs.  Pt prescribed Keflex for possible UTI.  GBS not yet performed  [name]

## 2012-10-06 NOTE — MAU Provider Note (Signed)
Agree 

## 2012-10-06 NOTE — Progress Notes (Signed)
md notified of continued low output. 75cc/5hr. Orders received for cbc now.

## 2012-10-06 NOTE — MAU Note (Signed)
Patient states water broke around 0200. Heard a pop and felt a gush fluid.

## 2012-10-06 NOTE — Anesthesia Postprocedure Evaluation (Signed)
  Anesthesia Post Note  Patient: [name]  Procedure(s) Performed: Procedure(s) (LRB): Primary CESAREAN SECTION  of baby girl at 61 APGAR 8/8 (N/A)  Anesthesia type: Spinal  Patient location: PACU  Post pain: Pain level controlled  Post assessment: Post-op Vital signs reviewed  Last Vitals:  Filed Vitals:   10/06/12 0515  BP: 123/71  Pulse: 92  Temp:   Resp: 17    Post vital signs: Reviewed  Level of consciousness: awake  Complications: No apparent anesthesia complications

## 2012-10-07 ENCOUNTER — Encounter (HOSPITAL_COMMUNITY)
Admission: AD | Disposition: A | Discharge status: Home / Self Care | Payer: Self-pay | Source: Ambulatory Visit | Attending: Obstetrics and Gynecology

## 2012-10-07 ENCOUNTER — Encounter (HOSPITAL_COMMUNITY): Payer: Self-pay | Admitting: Certified Registered"

## 2012-10-07 ENCOUNTER — Encounter (HOSPITAL_COMMUNITY): Payer: Self-pay | Admitting: Obstetrics and Gynecology

## 2012-10-07 ENCOUNTER — Inpatient Hospital Stay (HOSPITAL_COMMUNITY): Payer: Medicaid Other

## 2012-10-07 ENCOUNTER — Encounter (HOSPITAL_COMMUNITY): Payer: Self-pay | Admitting: Anesthesiology

## 2012-10-07 ENCOUNTER — Inpatient Hospital Stay (HOSPITAL_COMMUNITY): Payer: Medicaid Other | Admitting: Anesthesiology

## 2012-10-07 HISTORY — PX: LAPAROTOMY: SHX154

## 2012-10-07 LAB — COMPREHENSIVE METABOLIC PANEL
ALT: 8 U/L (ref 0–35)
Albumin: 1.2 g/dL — ABNORMAL LOW (ref 3.5–5.2)
Alkaline Phosphatase: 118 U/L — ABNORMAL HIGH (ref 39–117)
Potassium: 5 mEq/L (ref 3.5–5.1)
Sodium: 129 mEq/L — ABNORMAL LOW (ref 135–145)
Total Protein: 4.1 g/dL — ABNORMAL LOW (ref 6.0–8.3)

## 2012-10-07 LAB — URINE CULTURE

## 2012-10-07 LAB — CBC
HCT: 26.5 % — ABNORMAL LOW (ref 36.0–46.0)
Platelets: 223 10*3/uL (ref 150–400)
RDW: 14.9 % (ref 11.5–15.5)
WBC: 22 10*3/uL — ABNORMAL HIGH (ref 4.0–10.5)

## 2012-10-07 SURGERY — LAPAROTOMY, EXPLORATORY
Anesthesia: General | Site: Abdomen | Wound class: Clean Contaminated

## 2012-10-07 MED ORDER — HYDROMORPHONE HCL PF 1 MG/ML IJ SOLN
INTRAMUSCULAR | Status: AC
  Filled 2012-10-07: qty 1

## 2012-10-07 MED ORDER — ONDANSETRON HCL 4 MG/2ML IJ SOLN
INTRAMUSCULAR | Status: DC | PRN
  Administered 2012-10-07: 4 mg via INTRAVENOUS

## 2012-10-07 MED ORDER — LACTATED RINGERS IV SOLN
INTRAVENOUS | Status: DC
  Administered 2012-10-07: 19:00:00 via INTRAVENOUS

## 2012-10-07 MED ORDER — HYDROMORPHONE HCL PF 1 MG/ML IJ SOLN: 0.2500 mg | INTRAMUSCULAR | Status: DC | PRN

## 2012-10-07 MED ORDER — ONDANSETRON HCL 4 MG/2ML IJ SOLN: 4.0000 mg | Freq: Four times a day (QID) | INTRAMUSCULAR | Status: DC | PRN

## 2012-10-07 MED ORDER — SODIUM CHLORIDE 0.9 % IJ SOLN: 9.0000 mL | INTRAMUSCULAR | Status: DC | PRN

## 2012-10-07 MED ORDER — CEFAZOLIN SODIUM-DEXTROSE 2-3 GM-% IV SOLR
INTRAVENOUS | Status: AC
  Filled 2012-10-07: qty 50

## 2012-10-07 MED ORDER — LACTATED RINGERS IV SOLN
INTRAVENOUS | Status: DC
  Administered 2012-10-07 – 2012-10-09 (×4): via INTRAVENOUS

## 2012-10-07 MED ORDER — NALOXONE HCL 0.4 MG/ML IJ SOLN: 0.4000 mg | INTRAMUSCULAR | Status: DC | PRN

## 2012-10-07 MED ORDER — HYDROMORPHONE HCL PF 1 MG/ML IJ SOLN
INTRAMUSCULAR | Status: DC | PRN
  Administered 2012-10-07 (×2): 1 mg via INTRAVENOUS

## 2012-10-07 MED ORDER — ETOMIDATE 2 MG/ML IV SOLN
INTRAVENOUS | Status: DC | PRN
  Administered 2012-10-07: 8 mg via INTRAVENOUS

## 2012-10-07 MED ORDER — DIPHENHYDRAMINE HCL 50 MG/ML IJ SOLN: 12.5000 mg | Freq: Four times a day (QID) | INTRAMUSCULAR | Status: DC | PRN

## 2012-10-07 MED ORDER — CEFAZOLIN SODIUM 1-5 GM-% IV SOLN
1.0000 g | Freq: Three times a day (TID) | INTRAVENOUS | Status: DC
  Administered 2012-10-07 – 2012-10-09 (×6): 1 g via INTRAVENOUS
  Filled 2012-10-07 (×9): qty 50

## 2012-10-07 MED ORDER — METOCLOPRAMIDE HCL 5 MG/ML IJ SOLN: 10.0000 mg | Freq: Once | INTRAMUSCULAR | Status: AC | PRN

## 2012-10-07 MED ORDER — HYDROMORPHONE 0.3 MG/ML IV SOLN
INTRAVENOUS | Status: DC
  Administered 2012-10-08 (×2): 1.2 mg via INTRAVENOUS
  Administered 2012-10-08: 04:00:00 via INTRAVENOUS
  Administered 2012-10-09: 0.6 mg via INTRAVENOUS
  Administered 2012-10-09 (×2): 0.3 mg via INTRAVENOUS
  Filled 2012-10-07: qty 25

## 2012-10-07 MED ORDER — LACTATED RINGERS IV SOLN
INTRAVENOUS | Status: DC | PRN
  Administered 2012-10-07 (×2): via INTRAVENOUS

## 2012-10-07 MED ORDER — NEOSTIGMINE METHYLSULFATE 1 MG/ML IJ SOLN
INTRAMUSCULAR | Status: DC | PRN
  Administered 2012-10-07: 5 mg via INTRAVENOUS

## 2012-10-07 MED ORDER — STERILE WATER FOR IRRIGATION IR SOLN
Status: DC | PRN
  Administered 2012-10-07: 1000 mL

## 2012-10-07 MED ORDER — ROCURONIUM BROMIDE 50 MG/5ML IV SOLN
INTRAVENOUS | Status: AC
  Filled 2012-10-07: qty 1

## 2012-10-07 MED ORDER — MIDAZOLAM HCL 2 MG/2ML IJ SOLN
INTRAMUSCULAR | Status: AC
  Filled 2012-10-07: qty 2

## 2012-10-07 MED ORDER — FENTANYL CITRATE 0.05 MG/ML IJ SOLN
INTRAMUSCULAR | Status: AC
  Filled 2012-10-07: qty 5

## 2012-10-07 MED ORDER — SODIUM CHLORIDE 0.9 % IV SOLN
INTRAVENOUS | Status: DC | PRN
  Administered 2012-10-07: 10:00:00 via INTRAVENOUS

## 2012-10-07 MED ORDER — DIPHENHYDRAMINE HCL 12.5 MG/5ML PO ELIX
12.5000 mg | ORAL_SOLUTION | Freq: Four times a day (QID) | ORAL | Status: DC | PRN
  Filled 2012-10-07: qty 5

## 2012-10-07 MED ORDER — FENTANYL CITRATE 0.05 MG/ML IJ SOLN
INTRAMUSCULAR | Status: DC | PRN
  Administered 2012-10-07 (×2): 50 ug via INTRAVENOUS
  Administered 2012-10-07 (×3): 100 ug via INTRAVENOUS

## 2012-10-07 MED ORDER — MEPERIDINE HCL 25 MG/ML IJ SOLN: 6.2500 mg | INTRAMUSCULAR | Status: DC | PRN

## 2012-10-07 MED ORDER — GLYCOPYRROLATE 0.2 MG/ML IJ SOLN
INTRAMUSCULAR | Status: DC | PRN
  Administered 2012-10-07: .8 mg via INTRAVENOUS

## 2012-10-07 MED ORDER — SUCCINYLCHOLINE CHLORIDE 20 MG/ML IJ SOLN
INTRAMUSCULAR | Status: DC | PRN
  Administered 2012-10-07: 100 mg via INTRAVENOUS

## 2012-10-07 MED ORDER — CEFAZOLIN SODIUM-DEXTROSE 2-3 GM-% IV SOLR
INTRAVENOUS | Status: DC | PRN
  Administered 2012-10-07: 2 g via INTRAVENOUS

## 2012-10-07 MED ORDER — ETOMIDATE 2 MG/ML IV SOLN
INTRAVENOUS | Status: AC
  Filled 2012-10-07: qty 10

## 2012-10-07 MED ORDER — SUCCINYLCHOLINE CHLORIDE 20 MG/ML IJ SOLN
INTRAMUSCULAR | Status: AC
  Filled 2012-10-07: qty 10

## 2012-10-07 MED ORDER — HYDROMORPHONE 0.3 MG/ML IV SOLN: INTRAVENOUS | Status: DC

## 2012-10-07 MED ORDER — ROCURONIUM BROMIDE 100 MG/10ML IV SOLN
INTRAVENOUS | Status: DC | PRN
  Administered 2012-10-07: 5 mg via INTRAVENOUS
  Administered 2012-10-07: 40 mg via INTRAVENOUS

## 2012-10-07 SURGICAL SUPPLY — 38 items
CANISTER SUCTION 2500CC (MISCELLANEOUS) ×2 IMPLANT
CLOTH BEACON ORANGE TIMEOUT ST (SAFETY) ×2 IMPLANT
CONT PATH 16OZ SNAP LID 3702 (MISCELLANEOUS) ×2 IMPLANT
COVER LIGHT HANDLE  1/PK (MISCELLANEOUS) ×1
COVER LIGHT HANDLE 1/PK (MISCELLANEOUS) IMPLANT
DECANTER SPIKE VIAL GLASS SM (MISCELLANEOUS) IMPLANT
DRSG OPSITE POSTOP 4X10 (GAUZE/BANDAGES/DRESSINGS) ×1 IMPLANT
GLOVE BIO SURGEON STRL SZ 6.5 (GLOVE) ×1 IMPLANT
GLOVE BIO SURGEON STRL SZ7 (GLOVE) ×4 IMPLANT
GLOVE SURG SS PI 7.0 STRL IVOR (GLOVE) ×1 IMPLANT
GOWN PREVENTION PLUS LG XLONG (DISPOSABLE) ×6 IMPLANT
HEMOSTAT SURGICEL 2X3 (HEMOSTASIS) ×1 IMPLANT
NDL HYPO 25X1 1.5 SAFETY (NEEDLE) IMPLANT
NEEDLE HYPO 25X1 1.5 SAFETY (NEEDLE) IMPLANT
PACK ABDOMINAL GYN (CUSTOM PROCEDURE TRAY) ×2 IMPLANT
PAD ABD 7.5X8 STRL (GAUZE/BANDAGES/DRESSINGS) ×1 IMPLANT
PAD OB MATERNITY 4.3X12.25 (PERSONAL CARE ITEMS) ×2 IMPLANT
RTRCTR C-SECT PINK 34CM XLRG (MISCELLANEOUS) ×1 IMPLANT
SPONGE LAP 18X18 X RAY DECT (DISPOSABLE) ×9 IMPLANT
STAPLER VISISTAT 35W (STAPLE) ×2 IMPLANT
SUT PDS AB 0 CTX 60 (SUTURE) ×3 IMPLANT
SUT PLAIN 2 0 (SUTURE)
SUT PLAIN 2 0 XLH (SUTURE) ×1 IMPLANT
SUT PLAIN ABS 2-0 54XMFL TIE (SUTURE) IMPLANT
SUT VIC AB 0 CT1 18XCR BRD8 (SUTURE) IMPLANT
SUT VIC AB 0 CT1 27 (SUTURE) ×2
SUT VIC AB 0 CT1 27XBRD ANBCTR (SUTURE) IMPLANT
SUT VIC AB 0 CT1 36 (SUTURE) ×2 IMPLANT
SUT VIC AB 0 CT1 8-18 (SUTURE) ×2
SUT VIC AB 2-0 CT1 (SUTURE) ×2 IMPLANT
SUT VIC AB 2-0 SH 27 (SUTURE) ×2
SUT VIC AB 2-0 SH 27XBRD (SUTURE) IMPLANT
SUT VICRYL 0 TIES 12 18 (SUTURE) IMPLANT
SYR CONTROL 10ML LL (SYRINGE) IMPLANT
TAPE CLOTH SURG 4X10 WHT LF (GAUZE/BANDAGES/DRESSINGS) ×1 IMPLANT
TOWEL OR 17X24 6PK STRL BLUE (TOWEL DISPOSABLE) ×4 IMPLANT
TRAY FOLEY CATH 14FR (SET/KITS/TRAYS/PACK) ×2 IMPLANT
WATER STERILE IRR 1000ML POUR (IV SOLUTION) ×4 IMPLANT

## 2012-10-07 NOTE — Anesthesia Postprocedure Evaluation (Signed)
  Anesthesia Post-op Note  Patient: [name]  Procedure(s) Performed: Procedure(s): EXPLORATORY LAPAROTOMY (N/A)  Patient Location: PACU  Anesthesia Type:General  Level of Consciousness: awake, alert  and oriented  Airway and Oxygen Therapy: Patient Spontanous Breathing  Post-op Pain: mild  Post-op Assessment: Post-op Vital signs reviewed, Patient's Cardiovascular Status Stable, Respiratory Function Stable, Patent Airway, No signs of Nausea or vomiting and Pain level controlled  Post-op Vital Signs: Reviewed and stable  Complications: No apparent anesthesia complications

## 2012-10-07 NOTE — Progress Notes (Signed)
Clinical Social Work Department PSYCHOSOCIAL ASSESSMENT - MATERNAL/CHILD 10/06/2012  Patient:  [name], [name]  Account Number:  [id number]  Admit Date:  10/06/2012  Marjo Bicker Name:   [name]    Clinical Social Worker:  Lulu Riding, LCSW   Date/Time:  10/06/2012 03:00 PM  Date Referred:  10/06/2012   Referral source  NICU  CN     Referred reason  NICU  Behavioral Health Issues   Other referral source:    I:  FAMILY / HOME ENVIRONMENT Child's legal guardian:  PARENT  Guardian - Name Guardian - Age Guardian - Address  Carmell Elgin [address] [place], [place], [place] [address]  Elton Sin  same   Other household support members/support persons Name Relationship DOB  Teosha Casso [facility] 09/23/10   Other support:   MOB had supports with her today and reports having a great support system    II  PSYCHOSOCIAL DATA Information Source:  Patient Interview  Insurance claims handler Resources Employment:   Financial resources:  [employer] If Medicaid - County:  [employer] / Grade:   Maternity Care Coordinator / Child Services Coordination / Early Interventions:   [facility]  Cultural issues impacting care:   None stated    III  STRENGTHS Strengths  Adequate Resources  Compliance with medical plan  Home prepared for Child (including basic supplies)  Other - See comment  Supportive family/friends  Understanding of illness   Strength comment:  Pediatric follow up will be at [facility]   IV  RISK FACTORS AND CURRENT PROBLEMS Current Problem:  None   Risk Factor & Current Problem Patient Issue Family Issue Risk Factor / Current Problem Comment   N N     V  SOCIAL WORK ASSESSMENT  CSW met with MOB in her first floor room to introduce myself and complete assessment for NICU admission and hx of Anxiety.  FOB was sleeping and MOB had a visitor with her so CSW offered to return at a later time if needed.  MOB welcomed CSW into the room and  stated we could talk now.  MOB was very quiet, but appears to be coping well with baby's admission to NICU.  She seems to have a good understanding of baby's medical situation.  She states they have a good support system and everything they need for baby at home.  MOB's two year old son is being cared for by her brother while they are in the hospital.  CSW discussed signs and symptoms of PPD and common emotions related to the NICU experience.  CSW denies any emotional concerns at this time and states she feels comfortable calling her doctor if symptoms arise.  She states no PPD or symptoms with first child.  She currently takes Prozac for anxiety.  CSW discussed the fact that we do not know when baby will be medically ready for discharge and the possibility that she will need to stay in the hospital longer than MOB.  CSW asked if there would be any issues with transportation if this occurs, especially since MOB had a c/section.  Another visitor had just arrived at this time and both her visitors stated they would be able to help MOB travel back and forth if FOB is ever unavailable.  CSW explained on going support services offered by NICU CSW and gave contact information.  CSW does not have social concerns at this time.    VI SOCIAL WORK PLAN Social Work Plan  Psychosocial Support/Ongoing  Assessment of Needs  Patient/Family Education   Type of pt/family education:   Common emotions related to the NICU experience/signs and symptoms of PPD.   If child protective services report - county:   If child protective services report - date:   Information/referral to community resources comment:   No referral needs noted at [facility] time.   Other social work plan:

## 2012-10-07 NOTE — Transfer of Care (Signed)
Immediate Anesthesia Transfer of Care Note  Patient: [name]  Procedure(s) Performed: Procedure(s): EXPLORATORY LAPAROTOMY (N/A)  Patient Location: PACU  Anesthesia Type:General  Level of Consciousness: awake, alert  and oriented  Airway & Oxygen Therapy: Patient Spontanous Breathing and Patient connected to nasal cannula oxygen  Post-op Assessment: Report given to PACU RN and Post -op Vital signs reviewed and stable  Post vital signs: stable  Complications: No apparent anesthesia complications

## 2012-10-07 NOTE — Addendum Note (Signed)
Addendum created 10/07/12 1349 by Tyrone Apple. Malen Gauze, MD   Modules edited: Anesthesia Review and Sign Navigator Section

## 2012-10-07 NOTE — Op Note (Signed)
Pre-Operative Diagnosis: 1) Hemoperitoneum Postoperative diagnoses: 1) Same Procedure: Exploratory laparotomy Surgeon: Dr. Waynard Reeds Asst.: Dr. Jaymes Graff, Dr. Malva Limes Operative findings: Hemoperitoneum with approximately 2000cc of clotted blood.  Clot appeared to be emanating from the right angle of the uterine incision. No active bleeding was noted from any surface. Bilateral theca lutein cysts of the ovaries. Normal-appearing fallopian tubes, large and small bowel, appendix. Palpably, the liver, gallbladder, stomach, spleen were normal. The patient was transfused 3 units intraoperatively. Specimen: Endometrial curretings versus retained products of conception EBL: 2000ccs of evacuated clot   Procedure: [name] is a 78 caucasian female s/p a Primary low transverse cesarean section in the Early-morning hours of Monday, 10/06/2012. Throughout the evening the patient has had low urine output. She received IV Lasix and put out 200 cc of urine. This morning the patient was noted to be presyncopal, with altered sensorium, and pale appearing. A stat hemoglobin showed hemoglobin of 6.4 and a CT of the abdomen and pelvis showed a significant hemoperitoneum. Given these findings the decision was made to proceed with exploratory laparotomy to evaluate for a source of bleeding. Following the appropriate informed consent the patient was brought to the operating room where general anesthesia was administered. She was placed in the dorsal supine position. Prior to prepping and draping the patient's staples were removed from her low transverse skin incision. She was prepped and draped in the normal sterile fashion. Prior to starting the procedure the patient was appropriately identified and a timeout procedure. The skin incision was opened without difficulty. The fascial suture was cut and the fascial incision was opened. The suture in the reapproximation of the rectus muscles was opened. The  peritoneal incision was then opened. Upon entry into into the abdominal cavity a large amount of port wine fluid was evacuated with clot. Approximately 2000 cc of clotted blood was evacuated from the abdominal cavity. A Balfour self-retaining retractor was placed. The incision was inspected. It appeared that the clot was emanating from the uterine low transverse incision.  There was a small area on the right-hand aspect of the incision which appeared to have had a broken suture. This was the area that the clot appeared to be originating from. The uterine incision was reopened And the uterus was evacuated for a small amount of clot and some tissue that appeared to be retained products of conception. A banjo curet was used to carefully remove some additional retained tissue. The uterus was noted to have excellent tone and be relatively hemostatic. The cervix was dilated with a ring forcep. The uterine incision was repaired with a #1 Vicryl in a running locked fashion with a second imbricating layer. Some persistent bleeding was noted from the right-hand aspect of the uterine incision. Hemostasis was achieved with several figure-of-eight sutures. The bladder needed to be further dissected off the lower uterine segment to achieve hemostasis. Surgicel was then placed over the repaired lower uterine segment. The bladder was retrograde filled with sterile milk to evaluate for any possible injury to the bladder. No extravasation of sterile milk into the operative field was noted.    At this point the upper abdomen was explored. The ovaries were enlarged bilaterally consistent with bilateral thecal lutein cysts. The tubes were normal and hemostatic. The appendix appeared normal, the liver was smooth, The gallbladder was palpably normal, the stomach was normal, spleen was normal. There appeared to be some bloody fluid in the upper abdomen that continue to run down. This was evacuated  and by the end of the case felt to be  consistent with rundown. No active bleeding was identified in the upper abdomen. The bowels were normal. Given the significant manipulation of the bowels anesthesia placed a nasogastric tube intraoperatively.   The Balfour retractor was removed and the peritoneal incision was closed with a #1 Vicryl in a running locked fashion. The rectus muscle was then reapproximated with #1 Vicryl in running fashion. The fascia was closed with a looped PDS in a running fashion. 20 plain gut suture was used to reapproximate the subcutaneous tissue in an interrupted fashion. The skin was closed with staples. All sponge, lap, needle counts were correct x2 the patient tolerated the procedure well and was brought to the recovery room in stable condition following the procedure.

## 2012-10-07 NOTE — Progress Notes (Deleted)
Pt asked to be helped to bathroom.  Nurse called into room by tech.  Pt found to be dizzy, pale, nauseous, in pain and sitting on the side of the bed.  VSS, CBG normal.  Lochia small.  RR called to room.  MD paged, en route.

## 2012-10-07 NOTE — Progress Notes (Signed)
Confirmed with Dr. Tenny Craw that pt. Does not need to be on telemetry

## 2012-10-07 NOTE — Progress Notes (Signed)
Patient ID: [name], female   DOB: [birth year], 27 y.o.   MRN: [id number]   CTSP for presyncopal even with altered sensorium. Pt is s/p a primary LTCS @ 3:49 on 10/06/12.  She had been having decreased UOP overnight and did receive lasix IV x 1 that resulted in 200 cc UPO.  She had been noticing worsening of her abdominal pain throughout the night.  When she got up to ambulate this morning she was noted to nearly pass out.  When helped back to her bed she was pale appearing.  Vitals were stable, CBG was 105. She did report some chest pain and SOB. Pt has a h/o of open heart surgery when she was 74 months old. Filed Vitals:   10/07/12 0043 10/07/12 0400 10/07/12 0514 10/07/12 0810  BP: 121/82 105/62 99/63 118/77  Pulse: 86 92 91 105  Temp: 97.5 F (36.4 C) 97.6 F (36.4 C) 98.5 F (36.9 C)   TempSrc: Oral Oral Oral   Resp: 20 20 20 20   Height:      Weight:      SpO2: 96% 95% 95% 100%   Pt somnolent with difficulty keeping eyes open.  Appropriately responsive to questions Pale appearing RRR Crackles at bilateral bases 1x2cm erythematous lesion under right breast with central raised area. Skin intact, no purulence  Abdomen obese, soft but tenderness greater than expected, tenderness seems greater L vs right, pts abdomen leaning more to this side. No ecchymoses Incision C/D/I 2+ peripheral edema DTRs 2-3+, clonus 2-3+  Labs CBC    Component Value Date/Time   WBC 21.8* 10/07/2012 0842   RBC 2.19* 10/07/2012 0842   HGB 6.4* 10/07/2012 0842   HCT 19.0* 10/07/2012 0842   PLT 243 10/07/2012 0842   MCV 86.8 10/07/2012 0842   MCH 29.2 10/07/2012 0842   MCHC 33.7 10/07/2012 0842   RDW 15.6* 10/07/2012 0842   LYMPHSABS 2.4 10/07/2012 0842   MONOABS 2.2* 10/07/2012 0842   EOSABS 0.0 10/07/2012 0842   BASOSABS 0.2* 10/07/2012 0842    CMP     Component Value Date/Time   NA 133* 10/06/2012 0530   K 4.1 10/06/2012 0530   CL 98 10/06/2012 0530   CO2 22 10/06/2012 0530   GLUCOSE 94 10/06/2012  0530   BUN 17 10/06/2012 0530   CREATININE 1.08 10/06/2012 0530   CALCIUM 8.4 10/06/2012 0530   PROT 4.2* 10/06/2012 0530   ALBUMIN 1.4* 10/06/2012 0530   AST 39* 10/06/2012 0530   ALT 8 10/06/2012 0530   ALKPHOS 153* 10/06/2012 0530   BILITOT 0.8 10/06/2012 0530   GFRNONAA 70* 10/06/2012 0530   GFRAA 81* 10/06/2012 0530    A/P 1) CT abd/pelvis to evaluate for intra-adominal hematoma or fluid collection. May need to take patient to OR if concern for intraabdominal bleeding noted 2) Transfer to AICU 3) Transfuse 2 units, stay ahead by 2  A/P 1)

## 2012-10-07 NOTE — Progress Notes (Signed)
After multiple stick from lab, unable to get blood for most ordered test. Was just able to get a finger stick for CBC and Dr. Tenny Craw aware

## 2012-10-07 NOTE — Progress Notes (Signed)
Pt asked to be helped to bathroom.  Nurse called into room by tech.  Pt found to be dizzy, pale, nauseous, in pain and sitting on the side of the bed.  VSS, CBG normal.  Lochia small.  RR called to room.  MD paged, en route. 

## 2012-10-07 NOTE — Progress Notes (Signed)
  S: Pts pain is currently controlled. Unable to draw blood due to peripheral edema despite multiple attempts. A finger stick was obtained for CBC O: Filed Vitals:   10/07/12 1635 10/07/12 1700 10/07/12 1800 10/07/12 1926  BP:  134/79 134/86 134/78  Pulse:   88 88  Temp:   98.2 F (36.8 C) 98.5 F (36.9 C)  TempSrc:    Oral  Resp: 15 18 19    Height:      Weight:      SpO2: 99%  98% 99%  UOP 50/30/10, concentrated appearing  Somnolent but arousable, pale appearing  A/P 1) Clinically the patient appears to be intravascularly dry. I believe the hemoglobin obtained by the fingerstick of 9.4 is erroneously elevated. Anesthesia CRNA was able to place a second IV in a vein in the patient's left breast. I have spoken with the PICC line team they are unable to place a PICC line this evening but will place the patient on the PICC list for the morning. In the meantime, if we lose the patient's second peripheral IV she will require central line placement. Given the patient's clinical picture I plan to proceed with transfusion of 2 additional units of blood. We'll monitor the patient's urine output closely. There is no clinical evidence of external bleeding at this time. We were unable to obtain blood work to evaluate coagulation status. We'll proceed with transfusion for expansion of intravascular volume

## 2012-10-07 NOTE — Anesthesia Preprocedure Evaluation (Addendum)
Anesthesia Evaluation  Patient identified by MRN, date of birth, ID band Patient awake and Patient confused    Reviewed: Allergy & Precautions, H&P , NPO status , Patient's Chart, lab work & pertinent test results  Airway Mallampati: III TM Distance: >3 FB Neck ROM: full    Dental no notable dental hx. (+) Teeth Intact   Pulmonary neg pulmonary ROS,    Pulmonary exam normal       Cardiovascular negative cardio ROS  + dysrhythmias Rhythm:regular Rate:Normal     Neuro/Psych Anxiety    GI/Hepatic negative GI ROS, Neg liver ROS,   Endo/Other  negative endocrine ROSdiabetes  Renal/GU negative Renal ROS     Musculoskeletal negative musculoskeletal ROS (+)   Abdominal   Peds  Hematology negative hematology ROS (+)   Anesthesia Other Findings   Reproductive/Obstetrics negative OB ROS                           Anesthesia Physical  Anesthesia Plan  ASA: III and emergent  Anesthesia Plan: General   Post-op Pain Management:    Induction: Intravenous  Airway Management Planned: Oral ETT  Additional Equipment:   Intra-op Plan:   Post-operative Plan: Extubation in OR  Informed Consent: I have reviewed the patients History and Physical, chart, labs and discussed the procedure including the risks, benefits and alternatives for the proposed anesthesia with the patient or authorized representative who has indicated his/her understanding and acceptance.   Dental advisory given  Plan Discussed with: Anesthesiologist, CRNA and Surgeon  Anesthesia Plan Comments:       Anesthesia Quick Evaluation

## 2012-10-07 NOTE — Addendum Note (Signed)
Addendum created 10/07/12 1837 by Armanda Heritage, RN   Modules edited: Anesthesia Events, Anesthesia LDA

## 2012-10-07 NOTE — Significant Event (Addendum)
Rapid Response Event Note  Overview: Time Called: 0819 Arrival Time: 0830 Event Type: Respiratory;Cardiac;Other (Comment)  Initial Focused Assessment: Pt. In bed, awake and oriented, pale, VSS at this time, c/o SOB and chest pressure. Pt. C/o abd. Pain throughout 4 quad but espacially on LL abd. Small amt. Of vaginal bleedin and fundus firm.   Interventions: STAT EKG and STAT CBC, O2 2L, Dr. Tenny Craw to bedside to evaluate. HgB 6.4.  CBG = 105. Pt. Brought for STAT abd. CT.  Waited in CT with pt. For results to be called to Dr. Tenny Craw. After results called to Dr. Tenny Craw she arrived to bedside in CT and pt. Transferred straight to OR   Event Summary: Name of Physician Notified: Dr. Fredia Sorrow at [phone]    at    Outcome: Transferred (Comment);Other (Comment)     Mallie Darting

## 2012-10-07 NOTE — Progress Notes (Signed)
Pharmacist-Physician Communication  Concern: A possible Drug-Drug Interaction between a prior to admission medication and a medication ordered off of a hospital order set  Home medication: Fluoxetine Hospital ordered medication:  Metoclopramide  Description: Effects: Unexpected adverse effects may occur when metoCLOPramide and FLUoxetine are coadministered.  Mechanism: Unknown.  Management: Close clinical monitoring for signs of serotonin syndrome and extrapyramidal movements is indicated when metoCLOPramide and FLUoxetine are coadministered.   Recommendation(s): Discontinue the metoclopramide unless benefit clearly outweighs the risk.   Thank You,

## 2012-10-07 NOTE — Progress Notes (Signed)
From RR call in rm # [id number], pt. Transferred to CT. In Ct awaiting results to be communicated to Dr. Tenny Craw. Pt. Awake and oriented continues to c/o severeabd. Pain with any movement

## 2012-10-08 ENCOUNTER — Inpatient Hospital Stay (HOSPITAL_COMMUNITY): Payer: Medicaid Other

## 2012-10-08 LAB — CBC WITH DIFFERENTIAL/PLATELET
Basophils Absolute: 0.2 10*3/uL — ABNORMAL HIGH (ref 0.0–0.1)
Basophils Relative: 1 % (ref 0–1)
Eosinophils Absolute: 0 10*3/uL (ref 0.0–0.7)
Hemoglobin: 6.4 g/dL — CL (ref 12.0–15.0)
Lymphocytes Relative: 11 % — ABNORMAL LOW (ref 12–46)
MCHC: 33.7 g/dL (ref 30.0–36.0)
Neutrophils Relative %: 78 % — ABNORMAL HIGH (ref 43–77)
RDW: 15.6 % — ABNORMAL HIGH (ref 11.5–15.5)
WBC: 21.8 10*3/uL — ABNORMAL HIGH (ref 4.0–10.5)

## 2012-10-08 LAB — CBC
HCT: 29.3 % — ABNORMAL LOW (ref 36.0–46.0)
Hemoglobin: 10.2 g/dL — ABNORMAL LOW (ref 12.0–15.0)
WBC: 22.4 10*3/uL — ABNORMAL HIGH (ref 4.0–10.5)

## 2012-10-08 LAB — COMPREHENSIVE METABOLIC PANEL
ALT: 9 U/L (ref 0–35)
Albumin: 1.2 g/dL — ABNORMAL LOW (ref 3.5–5.2)
Alkaline Phosphatase: 111 U/L (ref 39–117)
BUN: 26 mg/dL — ABNORMAL HIGH (ref 6–23)
Chloride: 101 mEq/L (ref 96–112)
Glucose, Bld: 86 mg/dL (ref 70–99)
Potassium: 4.6 mEq/L (ref 3.5–5.1)
Sodium: 131 mEq/L — ABNORMAL LOW (ref 135–145)
Total Bilirubin: 0.6 mg/dL (ref 0.3–1.2)

## 2012-10-08 MED ORDER — FUROSEMIDE 10 MG/ML IJ SOLN
10.0000 mg | Freq: Once | INTRAMUSCULAR | Status: AC
  Administered 2012-10-08: 10 mg via INTRAVENOUS
  Filled 2012-10-08: qty 2

## 2012-10-08 MED ORDER — SODIUM CHLORIDE 0.9 % IJ SOLN
10.0000 mL | Freq: Two times a day (BID) | INTRAMUSCULAR | Status: DC
  Administered 2012-10-08 – 2012-10-11 (×5): 10 mL

## 2012-10-08 MED ORDER — SODIUM CHLORIDE 0.9 % IJ SOLN
10.0000 mL | INTRAMUSCULAR | Status: DC | PRN
  Administered 2012-10-10 – 2012-10-11 (×2): 10 mL
  Filled 2012-10-08: qty 40

## 2012-10-08 NOTE — Progress Notes (Signed)
VAS team here to place PICC line

## 2012-10-08 NOTE — Progress Notes (Signed)
Late entry Saw pt around 5pm.  Sh reports feeling much better.  Pain is well controlled and she reports flatus throughout the day.  Orders given to clamp NGT given about 3pm.  Pt tolerated well and NGT was d/c'd approx 6:30pm.  Spoke with RN now, who states pt has tolerated sips of 7-UP, has been up walking and is currently in NICU visiting the baby.  Will continue to advance diet.  Considered transfer to postpartum tonight, RN recommended transfer in am due to staffing issues.  Will likely d/c foley after am labs as pt's UOP has improved and Cr is trending down.  Cont. Other routine AICU care.

## 2012-10-08 NOTE — Anesthesia Postprocedure Evaluation (Signed)
  Anesthesia Post-op Note  Anesthesia Post Note  Patient: [name]  Procedure(s) Performed: * No procedures listed *  Anesthesia type: General  Patient location: AICU  Post pain: Pain level controlled  Post assessment: Post-op Vital signs reviewed  Last Vitals:  Filed Vitals:   10/08/12 0740  BP:   Pulse:   Temp: 36.8 C  Resp: 22    Post vital signs: Reviewed  Level of consciousness: sedated  Complications: No apparent anesthesia complications

## 2012-10-08 NOTE — Progress Notes (Signed)
Peripherally Inserted Central Catheter/Midline Placement  The IV Nurse has discussed with the patient and/or persons authorized to consent for the patient, the purpose of this procedure and the potential benefits and risks involved with this procedure.  The benefits include less needle sticks, lab draws from the catheter and patient may be discharged home with the catheter.  Risks include, but not limited to, infection, bleeding, blood clot (thrombus formation), and puncture of an artery; nerve damage and irregular heat beat.  Alternatives to this procedure were also discussed.  PICC/Midline Placement Documentation        Stacie Glaze Horton 10/08/2012, 11:11 AM

## 2012-10-08 NOTE — Progress Notes (Signed)
POD#1 ex lap for intraabdominal bleeding.  POD#2 c/s for SROM/Breech. Pt reports feeling better, reports some hunger.  Has passed flatus.  Appropriate lochia.  No dizziness.  No CP/SOB.  Pain well controlled  Filed Vitals:   10/08/12 0900 10/08/12 0940 10/08/12 0944 10/08/12 1000  BP: 130/83   128/77  Pulse: 85 90  86  Temp:      TempSrc:      Resp:   22 17  Height:      Weight:      SpO2: 99% 99% 100% 98%   RRR/some crackles bases of lungs b/l Fundus firm, decreased BS Inc: c/d/i Ext: pedal edema UOP: 505cc from 7p-7a, 400cc from 7a to present NGT: about 500cc so far, 50-60cc from 7-9am   Lab Results  Component Value Date   WBC 22.0* 10/07/2012   HGB 9.4* 10/07/2012   HCT 26.5* 10/07/2012   MCV 84.4 10/07/2012   PLT 223 10/07/2012    --/--/A POS, A POS (07/28 0315)  A/P Post op day #1 - exlap for postop bleeding, POD #2 c/s -breech UOP - improved Still awaiting PICC line placement, will obtain CBC and CMP at that time NG tube in place- will monitor output for a few more hours, if decreased, will clamp for trial.  If pt tolerated plan for discontinue NG late today. 10mg  Lasix for diuresis given lung crackles.  Other Routine AICU care.     Philip Aspen

## 2012-10-08 NOTE — Progress Notes (Signed)
Rec'd call from VAS team - will be here this morning to place PICC line

## 2012-10-08 NOTE — Progress Notes (Signed)
CXR results for PICC line placement called to Johny Shock, RN VAS team - ok to use PICC line.

## 2012-10-08 NOTE — Progress Notes (Signed)
NGT tube unclamped & reattached to intermittent suction to check amount of gastric contents = 75cc. NGT d/c'd per MD parameters. Ice chips given to pt.

## 2012-10-08 NOTE — Lactation Note (Signed)
This note was copied from the chart of Susan Avril Busser. Lactation Consultation Note    Follow up consult with this mom of a NICU baby, now 69 hours old. Mom reports she has not pumped yet. She has been very sick after abdominal bleed and surgery, after delivery. I assisted mom with pumping with DEP and showed mom how to hand express, and mom was able to provide a few drops of colostrum for [name]. I brought it to the NICU, and it was swabbed with a gloved finger into her mouth. Mom happy to hear this. I did some teaching on pumping with mom, and told her I would continue tomorrow, when hopefully she is feeling better.  Patient Name: [name] [id number] Date: 10/08/2012 Reason for consult: Follow-up assessment;NICU baby   Maternal Data Formula Feeding for Exclusion: Yes (baby in NICU) Reason for exclusion: Admission to Intensive Care Unit (ICU) post-partum Infant to breast within first hour of birth: Yes Breastfeeding delayed due to:: Infant status Has patient been taught Hand Expression?: Yes Does the patient have breastfeeding experience prior to this delivery?: Yes  Feeding    LATCH Score/Interventions                      Lactation Tools Discussed/Used Tools: Pump WIC Program: Yes (will call for DEP when se is feeling better) Pump Review: Setup, frequency, and cleaning;Other (comment) (hand expression) Initiated by:: c Shamone Winzer at 67 hours post partum - mom in AICU after second surgery for abdominal bleeding Date initiated:: 10/08/12 (1715)   Consult Status Consult Status: Follow-up Date: 10/09/12 Follow-up type: In-patient    Alfred Levins 10/08/2012, 6:29 PM

## 2012-10-09 LAB — CBC
MCHC: 34.3 g/dL (ref 30.0–36.0)
MCV: 87.7 fL (ref 78.0–100.0)
Platelets: 255 10*3/uL (ref 150–400)
RDW: 15.5 % (ref 11.5–15.5)
WBC: 19 10*3/uL — ABNORMAL HIGH (ref 4.0–10.5)

## 2012-10-09 LAB — COMPREHENSIVE METABOLIC PANEL
AST: 62 U/L — ABNORMAL HIGH (ref 0–37)
Albumin: 1.1 g/dL — ABNORMAL LOW (ref 3.5–5.2)
BUN: 16 mg/dL (ref 6–23)
Calcium: 7.6 mg/dL — ABNORMAL LOW (ref 8.4–10.5)
Chloride: 102 mEq/L (ref 96–112)
Creatinine, Ser: 0.75 mg/dL (ref 0.50–1.10)
Total Bilirubin: 0.5 mg/dL (ref 0.3–1.2)
Total Protein: 4.1 g/dL — ABNORMAL LOW (ref 6.0–8.3)

## 2012-10-09 NOTE — Progress Notes (Signed)
Pt ambulated to Rm #[id number]

## 2012-10-09 NOTE — Progress Notes (Signed)
Subjective: Postpartum Day 3: Cesarean Delivery POD2 from Ex-lap for hemoperitoneum  Patient reports feeling much better. Still tender and has some upper abdominal gas discomfort.  Difficulty with ambulating because of swelling. +flatus, +tolerated regular diet. Taking oral percocet   Objective: Vital signs in last 24 hours: Temp:  [97.8 F (36.6 C)-98.4 F (36.9 C)] 98.2 F (36.8 C) (07/31 0809) Pulse Rate:  [74-110] 86 (07/31 0809) Resp:  [16-20] 16 (07/31 0927) BP: (128-159)/(76-95) 144/79 mmHg (07/31 0809) SpO2:  [96 %-99 %] 99 % (07/31 0809) Weight:  [93.26 kg (205 lb 9.6 oz)] 93.26 kg (205 lb 9.6 oz) (07/31 8295)  Physical Exam:  General: alert, cooperative and appears stated age Lochia: appropriate Uterine Fundus: firm, abdomen tender Incision: healing well DVT Evaluation: No evidence of DVT seen on physical exam.   Recent Labs  10/08/12 1235 10/09/12 0500  HGB 10.2* 9.1*  HCT 29.3* 26.5*    Assessment/Plan: Status post Cesarean section. Doing well postoperatively.  Continue current care. Plan to transfer to regular floor. Will Saline lock IVF and D/C abx. Continue foley catheter for now until ambulating a little bit better.  Pt diuresing. Creatinine has resolved to normal.  Hgb down slightly from yesterday.  I anticipate some decline as third spacing resolves. BPs more elevated now 140-150s/80-90s. Pt without symptoms. May need to consider antihypertensives at some point. Will monitor for now   [name] H. 10/09/2012, 9:56 AM

## 2012-10-09 NOTE — Progress Notes (Signed)
UR chart review completed.  

## 2012-10-09 NOTE — Lactation Note (Addendum)
This note was copied from the chart of Susan Catalea Labrecque. Lactation Consultation Note   Follow up consult with this mom of a NICU baby, now 83 hours post partum, and 36 3/[redacted] weeks gestation. Baby is stable, still on CPAP. Mom is now out of AICU, and feeling better. Still with increased BP's and lots of generalized edema, but feeling better. I assisted mom with pumping again today, and advised that mom begin pumping every 3 hours around the clock , if she can, and to add hand expression. I also advised her to walk as much as possible, to begin mobilizing her third spaced fluid, which will in turn increase her hydration and increase her milk supply.  I reviewed teaching on pumping with mom, with the NICU book on providing breast milk for your NICU baby.  Mom also is active with WIC, and called and left a message that her baby was born, is in the NICU, and will be needing a DEP. i explained the [facility]  Loaner program with mom, which she may need is she goes home this weekend. i will follow this family in the NICU.   Patient Name: [name] ZOXWR'U Date: 10/09/2012 Reason for consult: Follow-up assessment;NICU baby   Maternal Data    Feeding Feeding Type: Formula Length of feed: 30 min  LATCH Score/Interventions                      Lactation Tools Discussed/Used Tools: Pump Breast pump type: Double-Electric Breast Pump WIC Program: Yes (mom has called for DEP) Pump Review: Setup, frequency, and cleaning;Milk Storage;Other (comment) (hand expression, teaching from NICU booklet on EBM) Initiated by:: c Gerrard Crystal Date initiated:: 10/08/12   Consult Status Consult Status: Follow-up Date: 10/10/12 Follow-up type: In-patient    Alfred Levins 10/09/2012, 3:06 PM

## 2012-10-10 LAB — CBC WITH DIFFERENTIAL/PLATELET
Band Neutrophils: 0 % (ref 0–10)
Basophils Absolute: 0 10*3/uL (ref 0.0–0.1)
Basophils Relative: 0 % (ref 0–1)
Eosinophils Absolute: 0 10*3/uL (ref 0.0–0.7)
Eosinophils Relative: 0 % (ref 0–5)
HCT: 28.4 % — ABNORMAL LOW (ref 36.0–46.0)
MCH: 30.1 pg (ref 26.0–34.0)
MCV: 89 fL (ref 78.0–100.0)
Metamyelocytes Relative: 0 %
Myelocytes: 0 %
Platelets: 326 10*3/uL (ref 150–400)
RBC: 3.19 MIL/uL — ABNORMAL LOW (ref 3.87–5.11)
nRBC: 2 /100 WBC — ABNORMAL HIGH

## 2012-10-10 LAB — TYPE AND SCREEN
ABO/RH(D): A POS
Unit division: 0
Unit division: 0
Unit division: 0
Unit division: 0
Unit division: 0

## 2012-10-10 LAB — MRSA CULTURE

## 2012-10-10 LAB — COMPREHENSIVE METABOLIC PANEL
AST: 61 U/L — ABNORMAL HIGH (ref 0–37)
Albumin: 1.3 g/dL — ABNORMAL LOW (ref 3.5–5.2)
Alkaline Phosphatase: 112 U/L (ref 39–117)
Chloride: 102 mEq/L (ref 96–112)
Potassium: 4.3 mEq/L (ref 3.5–5.1)
Sodium: 136 mEq/L (ref 135–145)
Total Bilirubin: 0.7 mg/dL (ref 0.3–1.2)
Total Protein: 4.9 g/dL — ABNORMAL LOW (ref 6.0–8.3)

## 2012-10-10 NOTE — Lactation Note (Signed)
This note was copied from the chart of [name]. Lactation Consultation Note     Follow up consult with this mom of a NICU baby. She will probably go home one day this weekend, so I gave her the paper work to fill out for a loaner DEP. She is active with WIC.       I was visiting the baby in the NICU, and learned that she is not bottle feeding well. I noticed that her tongue is limited in it's ability to reach her palate, and tends to bowl shape when she tries.   These observations may be contributing to her problem with feeding. I will follow this family in the NICU.  Patient Name: [name] ZOXWR'U Date: 10/10/2012 Reason for consult: Follow-up assessment   Maternal Data    Feeding Feeding Type: Formula Length of feed: 30 min  LATCH Score/Interventions                      Lactation Tools Discussed/Used     Consult Status Consult Status: Follow-up Date: 10/11/12 Follow-up type: In-patient    Alfred Levins 10/10/2012, 6:38 PM

## 2012-10-10 NOTE — Progress Notes (Signed)
Patient is eating, ambulating, voiding.  Pain control is good. Pt feels good except did have some "wavy lines" in her vision today.  Filed Vitals:   10/09/12 2209 10/10/12 0128 10/10/12 0543 10/10/12 0558  BP: 151/91 138/87 150/92 141/93  Pulse: 99 79 76   Temp: 98.7 F (37.1 C) 98.3 F (36.8 C) 97.9 F (36.6 C)   TempSrc: Oral Oral Oral   Resp: 18 18 18    Height:      Weight:    93.328 kg (205 lb 12 oz)  SpO2: 98% 97% 97%     lungs:   clear to auscultation cor:    RRR Abdomen:  soft, appropriate tenderness, incisions intact and without erythema or exudate ex:    no cords   Results for orders placed during the hospital encounter of 10/06/12 (from the past 24 hour(s))  CBC WITH DIFFERENTIAL     Status: Abnormal   Collection Time    10/10/12  5:25 AM      Result Value Range   WBC 20.9 (*) 4.0 - 10.5 K/uL   RBC 3.19 (*) 3.87 - 5.11 MIL/uL   Hemoglobin 9.6 (*) 12.0 - 15.0 g/dL   HCT 81.1 (*) 91.4 - 78.2 %   MCV 89.0  78.0 - 100.0 fL   MCH 30.1  26.0 - 34.0 pg   MCHC 33.8  30.0 - 36.0 g/dL   RDW 95.6 (*) 21.3 - 08.6 %   Platelets 326  150 - 400 K/uL   Neutrophils Relative % 81 (*) 43 - 77 %   Lymphocytes Relative 14  12 - 46 %   Monocytes Relative 5  3 - 12 %   Eosinophils Relative 0  0 - 5 %   Basophils Relative 0  0 - 1 %   Band Neutrophils 0  0 - 10 %   Metamyelocytes Relative 0     Myelocytes 0     Promyelocytes Absolute 0     Blasts 0     nRBC 2 (*) 0 /100 WBC   Neutro Abs 17.0 (*) 1.7 - 7.7 K/uL   Lymphs Abs 2.9  0.7 - 4.0 K/uL   Monocytes Absolute 1.0  0.1 - 1.0 K/uL   Eosinophils Absolute 0.0  0.0 - 0.7 K/uL   Basophils Absolute 0.0  0.0 - 0.1 K/uL  COMPREHENSIVE METABOLIC PANEL     Status: Abnormal   Collection Time    10/10/12  5:25 AM      Result Value Range   Sodium 136  135 - 145 mEq/L   Potassium 4.3  3.5 - 5.1 mEq/L   Chloride 102  96 - 112 mEq/L   CO2 25  19 - 32 mEq/L   Glucose, Bld 82  70 - 99 mg/dL   BUN 8  6 - 23 mg/dL   Creatinine, Ser  5.78  0.50 - 1.10 mg/dL   Calcium 8.5  8.4 - 46.9 mg/dL   Total Protein 4.9 (*) 6.0 - 8.3 g/dL   Albumin 1.3 (*) 3.5 - 5.2 g/dL   AST 61 (*) 0 - 37 U/L   ALT 11  0 - 35 U/L   Alkaline Phosphatase 112  39 - 117 U/L   Total Bilirubin 0.7  0.3 - 1.2 mg/dL   GFR calc non Af Amer >90  >90 mL/min   GFR calc Af Amer >90  >90 mL/min    --/--/A POS, A POS (07/28 0315)/RNI  A/P    Post operative  4 day from C/S; POD 3 from Elap.  S/p transfusion- Hgb is stable.    Routine post op and postpartum care nowexcept for observation of BPs.  BPs not elevated enough to warrant treatment at this point but slightly elevated AST and some visual changes; will observe for least one more day.  Will consider Magnesium if any sx worsen.   Percocet for pain control.

## 2012-10-11 LAB — COMPREHENSIVE METABOLIC PANEL
Alkaline Phosphatase: 106 U/L (ref 39–117)
BUN: 9 mg/dL (ref 6–23)
CO2: 25 mEq/L (ref 19–32)
Calcium: 8.2 mg/dL — ABNORMAL LOW (ref 8.4–10.5)
GFR calc Af Amer: >90 mL/min (ref >90)
GFR calc non Af Amer: >90 mL/min (ref >90)
Glucose, Bld: 123 mg/dL — ABNORMAL HIGH (ref 70–99)
Total Protein: 4.8 g/dL — ABNORMAL LOW (ref 6.0–8.3)

## 2012-10-11 MED ORDER — OXYCODONE-ACETAMINOPHEN 5-325 MG PO TABS: 1.0000 | ORAL_TABLET | ORAL | Status: DC | PRN

## 2012-10-11 NOTE — Progress Notes (Signed)
  Patient is eating, ambulating, voiding.  Pain control is good.  No HA , blurry vision, scotomata or RUQ pain.  Pt had a "episode" that she was watching TV program but doesn't remember the show until the credits.  It is very consistant with her having fallen asleep especially since she is on the Percocet.  Pt is ambulating without problem.  She did vomit once last night.  Filed Vitals:   10/10/12 1800 10/10/12 2206 10/11/12 0206 10/11/12 0630  BP: 149/99 151/97 147/88 151/84  Pulse: 75 81 77 75  Temp: 98.4 F (36.9 C) 97.9 F (36.6 C) 97.3 F (36.3 C) 98.2 F (36.8 C)  TempSrc: Oral Oral Oral Oral  Resp: 18 18 16 24   Height:      Weight:    92.874 kg (204 lb 12 oz)  SpO2: 99% 99% 97% 100%    lungs:   clear to auscultation cor:    RRR Abdomen:  soft, appropriate tenderness, incisions intact and without erythema or exudate ex:    no cords   Lab Results  Component Value Date   WBC 20.9* 10/10/2012   HGB 9.6* 10/10/2012   HCT 28.4* 10/10/2012   MCV 89.0 10/10/2012   PLT 326 10/10/2012    --/--/A POS, A POS (07/28 0315)/RNI  A/P   Post operative day 5 C/S and POD 4 from Elap.  Routine post op and postpartum care.  Blood pressures are stable though slightly elevated.  Pt now  assymptomatic.  Will recheck CMET to make sure LFTs are still stable and then expect d/c with f/u BP in office  this week and precautions.    Percocet for pain control.

## 2012-10-11 NOTE — Lactation Note (Signed)
This note was copied from the chart of Susan Analycia Khokhar. Lactation Consultation Note  Patient Name: [name] ZOXWR'U Date: 10/11/2012     Maternal Data    Feeding   LATCH Score/Interventions                      Lactation Tools Discussed/Used     Consult Status    Spoke with mom about pump for home use. She reports that pumping is going well but she is only obtaining a few cc's- reassurance given. Encouraged pumping q 3 hours at least 8 times/ day to promote a good milk supply. Is probably going home this afternoon if lab work is OK. Plans for [facility] loaner pump but does not have money yet. To page when she is ready for pump rental. No questions at present,  Pamelia Hoit 10/11/2012, 3:26 PM

## 2012-10-11 NOTE — Discharge Summary (Signed)
Obstetric Discharge Summary Reason for Admission: rupture of membranes Prenatal Procedures: none Intrapartum Procedures: cesarean: low cervical, transverse Postpartum Procedures: transfusion and Elap for bleeding; MMR Complications-Operative and Postpartum: hemorrhage Hemoglobin  Date Value Range Status  10/10/2012 9.6* 12.0 - 15.0 g/dL Final     HCT  Date Value Range Status  10/10/2012 28.4* 36.0 - 46.0 % Final    Hospital Course:  Pt admitted for PPROM and breech at 35 weeks.  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  On POD 1, pt was symptomatic from blood loss and CT showed blood in peritoneal cavity.  Pt taken for Elap to make sure no active bleed.  Pt received blood transfusions and stabilized postop.  On POD4/3 pt's BPs were moderately elevated to 140-150s/80-90s and she had only one mildly elevated lft.  Pt observed for one more 24 hour period and was found to be stable without increase in sx.  Pt d/ced on POD 5/4.  Discharge Diagnoses: Term Pregnancy-delivered and pp hemorrhage  Discharge Information: Date: 10/11/2012 Activity: pelvic rest Diet: routine Medications: None Condition: stable Instructions: refer to practice specific booklet Discharge to: home Follow-up Information   Follow up with CALLAHAN, SIDNEY, DO In 1 week. (BP check)    Contact information:   [address] [place] [address] [phone]       Newborn Data: Live born female  Birth Weight: 4 lb 14.5 oz (2225 g) APGAR: 8, 8  Home with mother and NICU.  Avraham Benish A 10/11/2012, 9:35 AM

## 2012-10-11 NOTE — Progress Notes (Signed)
Discharge instructions provided to patient at bedside.  Medications, activity, incision care, follow up appointments, when to call the doctor and community resources discussed.  No questions at this time.  IV team notified of discharge order for PICC line removal.  Osvaldo Angst, RN---------
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
## 2012-10-16 ENCOUNTER — Encounter (HOSPITAL_COMMUNITY): Payer: Self-pay | Admitting: *Deleted

## 2012-10-16 ENCOUNTER — Inpatient Hospital Stay (HOSPITAL_COMMUNITY): Payer: Medicaid Other

## 2012-10-16 ENCOUNTER — Inpatient Hospital Stay (HOSPITAL_COMMUNITY)
Admission: AD | Admit: 2012-10-16 | Discharge: 2012-10-16 | Disposition: A | Discharge status: Home / Self Care | Payer: Medicaid Other | Source: Ambulatory Visit | Attending: Obstetrics and Gynecology | Admitting: Obstetrics and Gynecology

## 2012-10-16 DIAGNOSIS — R109 Unspecified abdominal pain: Secondary | ICD-10-CM | POA: Insufficient documentation

## 2012-10-16 DIAGNOSIS — R42 Dizziness and giddiness: Secondary | ICD-10-CM | POA: Insufficient documentation

## 2012-10-16 DIAGNOSIS — R112 Nausea with vomiting, unspecified: Secondary | ICD-10-CM | POA: Insufficient documentation

## 2012-10-16 DIAGNOSIS — G8918 Other acute postprocedural pain: Secondary | ICD-10-CM | POA: Insufficient documentation

## 2012-10-16 DIAGNOSIS — O99893 Other specified diseases and conditions complicating puerperium: Secondary | ICD-10-CM | POA: Insufficient documentation

## 2012-10-16 LAB — COMPREHENSIVE METABOLIC PANEL
ALT: 18 U/L (ref 0–35)
Albumin: 1.8 g/dL — ABNORMAL LOW (ref 3.5–5.2)
Alkaline Phosphatase: 162 U/L — ABNORMAL HIGH (ref 39–117)
BUN: 7 mg/dL (ref 6–23)
Chloride: 104 mEq/L (ref 96–112)
Potassium: 3.7 mEq/L (ref 3.5–5.1)
Sodium: 136 mEq/L (ref 135–145)
Total Bilirubin: 0.8 mg/dL (ref 0.3–1.2)
Total Protein: 5.6 g/dL — ABNORMAL LOW (ref 6.0–8.3)

## 2012-10-16 LAB — URINALYSIS, ROUTINE W REFLEX MICROSCOPIC
Glucose, UA: NEGATIVE mg/dL
Ketones, ur: 15 mg/dL — AB
Nitrite: NEGATIVE
Specific Gravity, Urine: 1.02 (ref 1.005–1.030)
pH: 8 (ref 5.0–8.0)

## 2012-10-16 LAB — CBC
HCT: 30.4 % — ABNORMAL LOW (ref 36.0–46.0)
Hemoglobin: 9.9 g/dL — ABNORMAL LOW (ref 12.0–15.0)
MCHC: 32.6 g/dL (ref 30.0–36.0)
RDW: 17.3 % — ABNORMAL HIGH (ref 11.5–15.5)
WBC: 9.2 10*3/uL (ref 4.0–10.5)

## 2012-10-16 LAB — URINE MICROSCOPIC-ADD ON

## 2012-10-16 MED ORDER — PROMETHAZINE HCL 25 MG PO TABS: 25.0000 mg | ORAL_TABLET | Freq: Four times a day (QID) | ORAL | Status: DC | PRN

## 2012-10-16 MED ORDER — HYDROMORPHONE HCL PF 1 MG/ML IJ SOLN
1.0000 mg | Freq: Once | INTRAMUSCULAR | Status: AC
  Administered 2012-10-16: 1 mg via INTRAMUSCULAR
  Filled 2012-10-16: qty 1

## 2012-10-16 NOTE — MAU Provider Note (Signed)
History     CSN: [id number]  Arrival date and time:     First Provider Initiated Contact with Patient 10/16/12 1608      Chief Complaint  Patient presents with  . Abdominal Pain  . Dizziness  . Nausea   HPI [name] is a 27 y.o. [id number] who is s/p C-section on 10/07/12 and subsequent surgery for resulting hemoperitoneum who presents to MAU today with increased abdominal pain. The patient states that she was in the hospital for labs for chromosome studies today when abdominal pain became much worse. She became diaphoretic and dizzy as well. She states pain is diffuse and rated at 10/10. She last took pain medication last night. She has had N/V for days. She denies fever. She feels as though her abdomen may be somewhat distended.   OB History   Grav Para Term Preterm Abortions TAB SAB Ect Mult Living   3 2 1 1 1  0 1 0 0 2      Past Medical History  Diagnosis Date  . Herpes genitalia   . HSV (herpes simplex virus) infection     last outbreak in may  . Mitral valve prolapse   . Status post repair of cor triatriatum     Echocardiogram 12/05: EF 55-65%, no mitral valve prolapse, mild RVE and no mention of any residual ASD.    Marland Kitchen Abnormal Pap smear   . Gestational diabetes   . Infection     UTI  . Anxiety     on meds    Past Surgical History  Procedure Laterality Date  . Cardiac surgery      congenital heart defect repair at 9 mos  . Exploration post operative open heart    . Cesarean section N/A 10/06/2012    Procedure: Primary CESAREAN SECTION  of baby girl at 0347 APGAR 8/8;  Surgeon: Philip Aspen, DO;  Location: [facility];  Service: Obstetrics;  Laterality: N/A;  . Laparotomy N/A 10/07/2012    Procedure: EXPLORATORY LAPAROTOMY;  Surgeon: Freddrick March. Tenny Craw, MD;  Location: [facility];  Service: Gynecology;  Laterality: N/A;    Family History  Problem Relation Age of Onset  . Diabetes Mother   . Asthma Mother   . Diabetes Father   . Asthma Father   . Diabetes  Maternal Grandmother   . Diabetes Paternal Grandfather     History  Substance Use Topics  . Smoking status: Former Games developer  . Smokeless tobacco: Never Used     Comment: quit 1st trimester  . Alcohol Use: Yes     Comment: occasional alcohol, not with preg    Allergies:  Allergies  Allergen Reactions  . Mucinex (Guaifenesin Er) Shortness Of Breath    Prescriptions prior to admission  Medication Sig Dispense Refill  . cephALEXin (KEFLEX) 500 MG capsule Take 1 capsule (500 mg total) by mouth 4 (four) times daily.  28 capsule  0  . FLUoxetine (PROZAC) 20 MG capsule Take 20 mg by mouth daily.      Marland Kitchen ibuprofen (ADVIL,MOTRIN) 200 MG tablet Take 200 mg by mouth every 6 (six) hours as needed for pain.      Marland Kitchen loratadine (CLARITIN) 10 MG tablet Take 10 mg by mouth daily.      Marland Kitchen oxyCODONE-acetaminophen (PERCOCET/ROXICET) 5-325 MG per tablet Take 1-2 tablets by mouth every 4 (four) hours as needed.  30 tablet  0  . phenylephrine (SUDAFED PE) 10 MG TABS Take 10 mg by mouth every 4 (four)  hours as needed (congestion).      . Prenatal Vit-Fe Fumarate-FA (PRENATAL MULTIVITAMIN) TABS Take 1 tablet by mouth every morning.       . valACYclovir (VALTREX) 500 MG tablet Take 500 mg by mouth daily.         Review of Systems  Constitutional: Positive for malaise/fatigue. Negative for fever.  Gastrointestinal: Positive for nausea, vomiting, abdominal pain and constipation. Negative for diarrhea.  Genitourinary: Negative for dysuria, urgency and frequency.       + vaginal bleeding  Neurological: Positive for weakness. Negative for dizziness.   Physical Exam   Blood pressure 162/99, pulse 83, temperature 97.6 F (36.4 C), temperature source Oral, resp. rate 20, last menstrual period 01/28/2012.  Physical Exam  Constitutional: She is oriented to person, place, and time. She appears well-developed and well-nourished. She appears distressed.  HENT:  Head: Normocephalic.  Cardiovascular: Regular rhythm  and normal heart sounds.   Respiratory: Effort normal and breath sounds normal. No respiratory distress.  GI: Bowel sounds are decreased. There is tenderness (diffuse).  Neurological: She is alert and oriented to person, place, and time.  Skin: Skin is warm. She is diaphoretic. No erythema. No pallor.  Psychiatric: She has a normal mood and affect.   Results for orders placed during the hospital encounter of 10/16/12 (from the past 24 hour(s))  URINALYSIS, ROUTINE W REFLEX MICROSCOPIC     Status: Abnormal   Collection Time    10/16/12  3:25 PM      Result Value Range   Color, Urine YELLOW  YELLOW   APPearance HAZY (*) CLEAR   Specific Gravity, Urine 1.020  1.005 - 1.030   pH 8.0  5.0 - 8.0   Glucose, UA NEGATIVE  NEGATIVE mg/dL   Hgb urine dipstick LARGE (*) NEGATIVE   Bilirubin Urine SMALL (*) NEGATIVE   Ketones, ur 15 (*) NEGATIVE mg/dL   Protein, ur 30 (*) NEGATIVE mg/dL   Urobilinogen, UA >0.9 (*) 0.0 - 1.0 mg/dL   Nitrite NEGATIVE  NEGATIVE   Leukocytes, UA MODERATE (*) NEGATIVE  URINE MICROSCOPIC-ADD ON     Status: Abnormal   Collection Time    10/16/12  3:25 PM      Result Value Range   Squamous Epithelial / LPF MANY (*) RARE   WBC, UA 21-50  <3 WBC/hpf   RBC / HPF 3-6  <3 RBC/hpf   Bacteria, UA MANY (*) RARE   Urine-Other RENAL TUBULAR EPITHELIALS CELLS PRESENT    CBC     Status: Abnormal   Collection Time    10/16/12  4:20 PM      Result Value Range   WBC 9.2  4.0 - 10.5 K/uL   RBC 3.30 (*) 3.87 - 5.11 MIL/uL   Hemoglobin 9.9 (*) 12.0 - 15.0 g/dL   HCT 81.1 (*) 91.4 - 78.2 %   MCV 92.1  78.0 - 100.0 fL   MCH 30.0  26.0 - 34.0 pg   MCHC 32.6  30.0 - 36.0 g/dL   RDW 95.6 (*) 21.3 - 08.6 %   Platelets 691 (*) 150 - 400 K/uL  COMPREHENSIVE METABOLIC PANEL     Status: Abnormal   Collection Time    10/16/12  4:20 PM      Result Value Range   Sodium 136  135 - 145 mEq/L   Potassium 3.7  3.5 - 5.1 mEq/L   Chloride 104  96 - 112 mEq/L   CO2 21  19 - 32 mEq/L  Glucose, Bld 83  70 - 99 mg/dL   BUN 7  6 - 23 mg/dL   Creatinine, Ser 1.61 (*) 0.50 - 1.10 mg/dL   Calcium 8.3 (*) 8.4 - 10.5 mg/dL   Total Protein 5.6 (*) 6.0 - 8.3 g/dL   Albumin 1.8 (*) 3.5 - 5.2 g/dL   AST 30  0 - 37 U/L   ALT 18  0 - 35 U/L   Alkaline Phosphatase 162 (*) 39 - 117 U/L   Total Bilirubin 0.8  0.3 - 1.2 mg/dL   GFR calc non Af Amer >90  >90 mL/min   GFR calc Af Amer >90  >90 mL/min   Dg Abd 1 View  10/16/2012   *RADIOLOGY REPORT*  Clinical Data: Abdominal pain.  Status post C-section and 10/06/2012.  ABDOMEN - 1 VIEW  Comparison: CT of the abdomen and 10/07/2012.  Findings: Gas and stool are noted throughout the colon, most pronounced in the region of the transverse colon.  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  No definite pathologic distension of small bowel.  No gross evidence of pneumoperitoneum.  IMPRESSION: 1.  Nonobstructive bowel gas pattern, as above. 2.  No pneumoperitoneum. 3.  Paucity of bowel gas throughout the lower abdomen and pelvis may simply relate to the patient's post gravid uterus, however, the possibility of recurrent hemorrhage in this region is not excluded. If there is clinical concern for recurrent hemorrhage which could cause mass effect accounting for these findings, a repeat CT scan with IV contrast would be recommended at this time.   Original Report Authenticated By: Trudie Reed, M.D.    MAU Course  Procedures None  MDM Discussed patient with Dr. Dareen Piano. Order CBC, CMP.  Dr. Dareen Piano came to MAU to see the patient. Requests KUB and UA Discussed results with Dr. Dareen Piano. May discharge home on liquid diet with Rx for Phenergan. If still bloated tomorrow, call office for an appointment with Dr. Tenny Craw.   Patient did not give urine sample until time of discharge. Patient has Rx for Macrobid at the pharmacy, but has not filled yet. Assessment and Plan  A: Post operative abdominal pain Nausea and vomiting  P: Discharge home Discussed liquid diet Rx for Phenergan sent to patient's pharmacy Instructed to continue previously prescribed pain medication regimen Patient instructed to keep follow-up in the office as scheduled unless symptoms persist, then she should call to follow-up with Dr. Tenny Craw tomorrow Patient may return to MAU as needed or if her condition were to change or worsen  Freddi Starr, PA-C  10/16/2012, 4:08 PM

## 2012-10-16 NOTE — MAU Note (Signed)
C/o N&V for 2-3 days;c/o abdominal pain over entire abdomen; c-section on 10-06-2012 and 2nd surgery fthrough c-section incision to evauate blood; has had 5 pints of blood total; had OB appointment today @ 1130;

## 2012-10-17 LAB — URINE CULTURE: Colony Count: 70000

## 2012-10-24 LAB — CHROMOSOME ANALYSIS, PERIPHERAL BLOOD

## 2012-10-31 ENCOUNTER — Inpatient Hospital Stay (HOSPITAL_COMMUNITY)
Admission: AD | Admit: 2012-10-31 | Discharge: 2012-11-04 | DRG: 776 | Disposition: A | Discharge status: Home / Self Care | Payer: Medicaid Other | Source: Ambulatory Visit | Attending: Internal Medicine | Admitting: Internal Medicine

## 2012-10-31 ENCOUNTER — Encounter (HOSPITAL_COMMUNITY): Payer: Self-pay | Admitting: *Deleted

## 2012-10-31 ENCOUNTER — Inpatient Hospital Stay (HOSPITAL_COMMUNITY): Payer: Medicaid Other

## 2012-10-31 DIAGNOSIS — I2510 Atherosclerotic heart disease of native coronary artery without angina pectoris: Secondary | ICD-10-CM | POA: Diagnosis present

## 2012-10-31 DIAGNOSIS — F411 Generalized anxiety disorder: Secondary | ICD-10-CM | POA: Diagnosis present

## 2012-10-31 DIAGNOSIS — I509 Heart failure, unspecified: Secondary | ICD-10-CM | POA: Diagnosis present

## 2012-10-31 DIAGNOSIS — R945 Abnormal results of liver function studies: Secondary | ICD-10-CM

## 2012-10-31 DIAGNOSIS — O9089 Other complications of the puerperium, not elsewhere classified: Secondary | ICD-10-CM | POA: Diagnosis present

## 2012-10-31 DIAGNOSIS — R7989 Other specified abnormal findings of blood chemistry: Secondary | ICD-10-CM | POA: Diagnosis present

## 2012-10-31 DIAGNOSIS — O903 Peripartum cardiomyopathy: Secondary | ICD-10-CM | POA: Diagnosis present

## 2012-10-31 DIAGNOSIS — J189 Pneumonia, unspecified organism: Secondary | ICD-10-CM

## 2012-10-31 DIAGNOSIS — R079 Chest pain, unspecified: Secondary | ICD-10-CM

## 2012-10-31 DIAGNOSIS — Z79899 Other long term (current) drug therapy: Secondary | ICD-10-CM

## 2012-10-31 DIAGNOSIS — D72829 Elevated white blood cell count, unspecified: Secondary | ICD-10-CM | POA: Diagnosis present

## 2012-10-31 DIAGNOSIS — Q242 Cor triatriatum: Secondary | ICD-10-CM

## 2012-10-31 DIAGNOSIS — O99893 Other specified diseases and conditions complicating puerperium: Principal | ICD-10-CM | POA: Diagnosis present

## 2012-10-31 DIAGNOSIS — R1011 Right upper quadrant pain: Secondary | ICD-10-CM

## 2012-10-31 DIAGNOSIS — I5021 Acute systolic (congestive) heart failure: Secondary | ICD-10-CM | POA: Diagnosis present

## 2012-10-31 DIAGNOSIS — Z87891 Personal history of nicotine dependence: Secondary | ICD-10-CM

## 2012-10-31 DIAGNOSIS — K819 Cholecystitis, unspecified: Secondary | ICD-10-CM | POA: Diagnosis present

## 2012-10-31 HISTORY — DX: Pneumonia, unspecified organism: J18.9

## 2012-10-31 LAB — CBC WITH DIFFERENTIAL/PLATELET
Basophils Absolute: 0.1 10*3/uL (ref 0.0–0.1)
Basophils Relative: 1 % (ref 0–1)
Eosinophils Absolute: 0.1 10*3/uL (ref 0.0–0.7)
Hemoglobin: 10.9 g/dL — ABNORMAL LOW (ref 12.0–15.0)
MCHC: 31.3 g/dL (ref 30.0–36.0)
Monocytes Relative: 11 % (ref 3–12)
Neutro Abs: 8.1 10*3/uL — ABNORMAL HIGH (ref 1.7–7.7)
Neutrophils Relative %: 67 % (ref 43–77)
Platelets: 686 10*3/uL — ABNORMAL HIGH (ref 150–400)
RDW: 16.5 % — ABNORMAL HIGH (ref 11.5–15.5)

## 2012-10-31 LAB — COMPREHENSIVE METABOLIC PANEL
ALT: 587 U/L — ABNORMAL HIGH (ref 0–35)
AST: 982 U/L — ABNORMAL HIGH (ref 0–37)
Albumin: 2.5 g/dL — ABNORMAL LOW (ref 3.5–5.2)
Alkaline Phosphatase: 387 U/L — ABNORMAL HIGH (ref 39–117)
Chloride: 100 mEq/L (ref 96–112)
Potassium: 3.6 mEq/L (ref 3.5–5.1)
Sodium: 136 mEq/L (ref 135–145)
Total Bilirubin: 1 mg/dL (ref 0.3–1.2)
Total Protein: 6.1 g/dL (ref 6.0–8.3)

## 2012-10-31 MED ORDER — DEXTROSE 5 % IV SOLN: 1.0000 g | Freq: Once | INTRAVENOUS | Status: DC

## 2012-10-31 MED ORDER — DEXTROSE 5 % IV SOLN: 500.0000 mg | Freq: Once | INTRAVENOUS | Status: DC

## 2012-10-31 NOTE — H&P (Signed)
Please refer to complete NP note.    Briefly, this is a 27 y.o. [id number] who is almost 1 month postop (7-28)from C/S for breech and PPROM at 35 weeks and was taken back to the OR the next day for PP hemorrhage.  Her H/H stabilized after at labout 8 units of blood given before and during reoperation.  Her blood pressures were mild to moderately elevated on POD 3 and 4 to 140-150s/80-90s and she was observed for one more 24 hour period before d/c.  BPs on POD 5 were the same and her labs on POD 4 showed normal LFTS, BUN/CR and platelets.  She returned to the office on 10-16-12 for BP check.  Pt was complaining of N/V and not feeling well.  Exam was normal and a CBC and CMET were drawn.  All preeclampsia labs were normal with AST 39 and ALT 21.  Platelets were elevated to 649 and her WBC was 13.4.  I felt this represented a mild endometritis and treated her with Keflex and Zofran as outpatient.  Her BP at that time was 130/90.  She returned to the office on 10-24-12 for recheck and had tolerated food and her antibiotic.  She felt better and her BP at that time was 124/96.  Today she presents with SOB.  On questioning she states that she has some dark spots like "sparkles" but no real scotomata.  She has no HA or any other s/s preeclampsia.  She appears to be labored breathing but can talk without having to stop.  She does not have any excessive abdominal pain. She has a non-productive cough.  CXR shows worsening bilateral perihilar bibasilar opacification right worse than left suggesting multifocal pneumonia versus edema.  Filed Vitals:   10/31/12 2022 10/31/12 2027 10/31/12 2032 10/31/12 2037  BP:      Pulse: 106 107 107 107  Temp:      TempSrc:      Resp:      Height:      Weight:      SpO2: 95% 95% 96% 96%   BPs 122/95/ to 138/100 but mostly 130s/90s Tmax 101   Lungs sound clear bilaterally Cor slightly tachy Abd incision intact and without signs of infection EX normal reflexes and no  clonus  Results for orders placed during the hospital encounter of 10/31/12 (from the past 24 hour(s))  CBC WITH DIFFERENTIAL     Status: Abnormal   Collection Time    10/31/12  6:07 PM      Result Value Range   WBC 12.0 (*) 4.0 - 10.5 K/uL   RBC 3.79 (*) 3.87 - 5.11 MIL/uL   Hemoglobin 10.9 (*) 12.0 - 15.0 g/dL   HCT 45.4 (*) 09.8 - 11.9 %   MCV 91.8  78.0 - 100.0 fL   MCH 28.8  26.0 - 34.0 pg   MCHC 31.3  30.0 - 36.0 g/dL   RDW 14.7 (*) 82.9 - 56.2 %   Platelets 686 (*) 150 - 400 K/uL   Neutrophils Relative % 67  43 - 77 %   Neutro Abs 8.1 (*) 1.7 - 7.7 K/uL   Lymphocytes Relative 20  12 - 46 %   Lymphs Abs 2.4  0.7 - 4.0 K/uL   Monocytes Relative 11  3 - 12 %   Monocytes Absolute 1.4 (*) 0.1 - 1.0 K/uL   Eosinophils Relative 1  0 - 5 %   Eosinophils Absolute 0.1  0.0 - 0.7 K/uL  Basophils Relative 1  0 - 1 %   Basophils Absolute 0.1  0.0 - 0.1 K/uL  COMPREHENSIVE METABOLIC PANEL     Status: Abnormal   Collection Time    10/31/12  6:07 PM      Result Value Range   Sodium 136  135 - 145 mEq/L   Potassium 3.6  3.5 - 5.1 mEq/L   Chloride 100  96 - 112 mEq/L   CO2 22  19 - 32 mEq/L   Glucose, Bld 103 (*) 70 - 99 mg/dL   BUN 22  6 - 23 mg/dL   Creatinine, Ser 5.78  0.50 - 1.10 mg/dL   Calcium 8.4  8.4 - 46.9 mg/dL   Total Protein 6.1  6.0 - 8.3 g/dL   Albumin 2.5 (*) 3.5 - 5.2 g/dL   AST 629 (*) 0 - 37 U/L   ALT 587 (*) 0 - 35 U/L   Alkaline Phosphatase 387 (*) 39 - 117 U/L   Total Bilirubin 1.0  0.3 - 1.2 mg/dL   GFR calc non Af Amer >90  >90 mL/min   GFR calc Af Amer >90  >90 mL/min    A/P POD 25 days from C/S followed by Elap and Blood transfusions with bibasilar opacification suggesting pneumonia and excessively elevated LFTS (which were normal one week out from delivery.)  Cr is normal ( she had been in mild renal failure at the time of the PP hemorrhage).  Her WBC is mildly elevated and her platelets are still very elevated.    Although HELLP syndrome is still a  possibility, the overall picture is not c/w this diagnosis, especially in conjunction with pneumonia.  I have asked for the pt to be transferred to [facility] for further evaluation by the Internists and Specialists there.  I remain available if it is determined that her clinical picture is indeed HELLP.

## 2012-10-31 NOTE — ED Notes (Signed)
[id number] 

## 2012-10-31 NOTE — ED Provider Notes (Signed)
CSN: [id number]     Arrival date & time 10/31/12  1633 History     First MD Initiated Contact with Patient 10/31/12 2121     Chief Complaint  Patient presents with  . Pneumonia   (Consider location/radiation/quality/duration/timing/severity/associated sxs/prior Treatment) HPI Comments: Patient presents to the ED with chief complaint of shortness of breath. She is sent over from [facility] hospital with diagnosis of pneumonia. She was sent here because she also has elevated liver enzymes. She also complains of right upper quadrant abdominal pain when questioned. She endorses subjective fevers and chills. She states that she has not felt well for some time. She has not tried anything to alleviate her symptoms. Nothing makes her symptoms better or worse.  The history is provided by the patient. No language interpreter was used.    Past Medical History  Diagnosis Date  . Herpes genitalia   . HSV (herpes simplex virus) infection     last outbreak in may  . Mitral valve prolapse   . Status post repair of cor triatriatum     Echocardiogram 12/05: EF 55-65%, no mitral valve prolapse, mild RVE and no mention of any residual ASD.    Marland Kitchen Abnormal Pap smear   . Gestational diabetes   . Infection     UTI  . Anxiety     on meds   Past Surgical History  Procedure Laterality Date  . Cardiac surgery      congenital heart defect repair at 9 mos  . Exploration post operative open heart    . Cesarean section N/A 10/06/2012    Procedure: Primary CESAREAN SECTION  of baby girl at 0347 APGAR 8/8;  Surgeon: Philip Aspen, DO;  Location: [facility];  Service: Obstetrics;  Laterality: N/A;  . Laparotomy N/A 10/07/2012    Procedure: EXPLORATORY LAPAROTOMY;  Surgeon: Freddrick March. Tenny Craw, MD;  Location: [facility];  Service: Gynecology;  Laterality: N/A;   Family History  Problem Relation Age of Onset  . Diabetes Mother   . Asthma Mother   . Diabetes Father   . Asthma Father   . Diabetes Maternal Grandmother   .  Diabetes Paternal Grandfather    History  Substance Use Topics  . Smoking status: Former Games developer  . Smokeless tobacco: Never Used     Comment: quit 1st trimester  . Alcohol Use: Yes     Comment: occasional alcohol, not with preg   OB History   Grav Para Term Preterm Abortions TAB SAB Ect Mult Living   3 2 1 1 1  0 1 0 0 2     Review of Systems  All other systems reviewed and are negative.    Allergies  Mucinex  Home Medications   Current Outpatient Rx  Name  Route  Sig  Dispense  Refill  . cephALEXin (KEFLEX) 500 MG capsule   Oral   Take 1 capsule (500 mg total) by mouth 4 (four) times daily.   28 capsule   0   . FLUoxetine (PROZAC) 20 MG capsule   Oral   Take 20 mg by mouth daily.         Marland Kitchen ibuprofen (ADVIL,MOTRIN) 200 MG tablet   Oral   Take 200 mg by mouth every 6 (six) hours as needed for pain.         Marland Kitchen loratadine (CLARITIN) 10 MG tablet   Oral   Take 10 mg by mouth daily.         Marland Kitchen oxyCODONE-acetaminophen (PERCOCET/ROXICET) 5-325  MG per tablet   Oral   Take 1-2 tablets by mouth every 4 (four) hours as needed.   30 tablet   0   . phenylephrine (SUDAFED PE) 10 MG TABS   Oral   Take 10 mg by mouth every 4 (four) hours as needed (congestion).         . Prenatal Vit-Fe Fumarate-FA (PRENATAL MULTIVITAMIN) TABS   Oral   Take 1 tablet by mouth every morning.          . promethazine (PHENERGAN) 25 MG tablet   Oral   Take 1 tablet (25 mg total) by mouth every 6 (six) hours as needed for nausea.   30 tablet   0   . valACYclovir (VALTREX) 500 MG tablet   Oral   Take 500 mg by mouth daily as needed (for outbreak).           BP 135/91  Pulse 107  Temp(Src) 99.4 F (37.4 C) (Oral)  Resp 20  Ht 4' 10.75" (1.492 m)  Wt 163 lb 8 oz (74.163 kg)  BMI 33.32 kg/m2  SpO2 94%  Breastfeeding? No Physical Exam  Nursing note and vitals reviewed. Constitutional: She is oriented to person, place, and time. She appears well-developed and  well-nourished.  HENT:  Head: Normocephalic and atraumatic.  Eyes: Conjunctivae and EOM are normal. Pupils are equal, round, and reactive to light.  Neck: Normal range of motion. Neck supple.  Cardiovascular: Normal rate and regular rhythm.  Exam reveals no gallop and no friction rub.   No murmur heard. Pulmonary/Chest: Effort normal and breath sounds normal. No respiratory distress. She has no wheezes. She has no rales. She exhibits no tenderness.  Abdominal: Soft. Bowel sounds are normal. She exhibits no distension and no mass. There is tenderness. There is no rebound and no guarding.  Right upper quadrant abdominal tenderness  Musculoskeletal: Normal range of motion. She exhibits no edema and no tenderness.  Neurological: She is alert and oriented to person, place, and time.  Skin: Skin is warm and dry.  Psychiatric: She has a normal mood and affect. Her behavior is normal. Judgment and thought content normal.    ED Course   Procedures (including critical care time)  Labs Reviewed  CBC WITH DIFFERENTIAL - Abnormal; Notable for the following:    WBC 12.0 (*)    RBC 3.79 (*)    Hemoglobin 10.9 (*)    HCT 34.8 (*)    RDW 16.5 (*)    Platelets 686 (*)    Neutro Abs 8.1 (*)    Monocytes Absolute 1.4 (*)    All other components within normal limits  COMPREHENSIVE METABOLIC PANEL - Abnormal; Notable for the following:    Glucose, Bld 103 (*)    Albumin 2.5 (*)    AST 982 (*)    ALT 587 (*)    Alkaline Phosphatase 387 (*)    All other components within normal limits   Dg Chest 2 View  10/31/2012   *RADIOLOGY REPORT*  Clinical Data: 3 weeks postpartum.  Shortness of breath with productive cough.  CHEST - 2 VIEW  Comparison: 10/08/2012  Findings: Lungs are adequately inflated demonstrate moderate perihilar bibasilar opacification right worse than left which may be due to edema versus multi focal pneumonia. No definite effusions seen.  Moderate stable cardiomegaly.  Surgical clips  over the anterior/superior mediastinum unchanged.  Remainder of the exam is unchanged.  IMPRESSION: Worsening bilateral perihilar bibasilar opacification right worse than left suggesting multifocal  pneumonia versus edema.  Stable cardiomegaly.   Original Report Authenticated By: Elberta Fortis, M.D.   US Abdomen Complete  10/31/2012   *RADIOLOGY REPORT*  Clinical Data:  Right upper quadrant pain.  Elevated liver function studies.  COMPLETE ABDOMINAL ULTRASOUND  Comparison:  10/07/2012  Findings:  Gallbladder:  Diffuse gallbladder wall thickening and edema.  Wall thickness measures from between 4-7 mm.  No gallstones or sludge are visualized but there is a positive Murphy's sign.  Findings may represent inflammatory process or acalculus cholecystitis.  Common bile duct:  Normal caliber with measured diameter of 2 mm.  Liver:  Small amount of fluid is noted around the liver.  Compared with the previous CT scan, the amount of ascites appears to be decreased.  No focal liver lesions.  IVC:  Appears normal.  Pancreas:  No focal abnormality seen.  Spleen:  Spleen length measures 8.7 cm.  Normal parenchymal echotexture.  Right Kidney:  Right kidney measures 11.6 cm length.  No hydronephrosis.  Left Kidney:  Left kidney measures 12.8 cm length.  No hydronephrosis.  Abdominal aorta:  No aneurysm identified.  IMPRESSION: Gallbladder wall is thickened and edematous in the Murphy's sign is positive.  No stones or sludge are visualized.  Changes could represent inflammatory process, edema, or acalculus cholecystitis. Small amount of fluid around the liver edge appears to be decreased since previous CT.   Original Report Authenticated By: Burman Nieves, M.D.   1. Community acquired pneumonia   2. Cholecystitis   3. Elevated LFTs   4. Healthcare-associated pneumonia     MDM  Patient sent over from [facility] hospital with pneumonia. She also has elevated liver enzymes. Do not believe this to be HELLP syndrome. Will check  right upper quadrant ultrasound and will reevaluate. Discussed patient with Dr. Micheline Maze.  10:43 PM Discussed the patient with Dr. Johna Sheriff from surgery who recommends hospitalist admission for pneumonia, and he will consult.  Discussed patient with [facility], who will admit the patient, Dr. Johna Sheriff will consult.  Roxy Horseman, PA-C 11/01/12 [phone]

## 2012-10-31 NOTE — MAU Note (Signed)
Pt states had emergent c/s 10/06/2012, next day went back to surgery because internal suture came open and pt was bleeding internally. Having shortness of breath and coughing up thick greenish yellow phlegm. Having upper abd pain that is constant, across upper abdomen. Was told to come in for eval of possible pneumonia.

## 2012-10-31 NOTE — MAU Provider Note (Signed)
History     CSN: [id number]  Arrival date and time: 10/31/12 1633   First Provider Initiated Contact with Patient 10/31/12 1749      Chief Complaint  Patient presents with  . Pneumonia   HPI [name] is 27 y.o. [id number] presents with shortness of breath. She delivered by C-Section on 10/06/12 (Dr. Claiborne Billings) and went back to surgery for abdominal bleeding on 10/07/12 (Dr. Tenny Craw).  She is a patient of Dr. Kittie Plater.  She states she has not felt good since her discharge.  OVer the last 3 days she has had an increase in an nonproductive cough and shortness of breath.  Denies hx of asthma.  Past Medical History  Diagnosis Date  . Herpes genitalia   . HSV (herpes simplex virus) infection     last outbreak in may  . Mitral valve prolapse   . Status post repair of cor triatriatum     Echocardiogram 12/05: EF 55-65%, no mitral valve prolapse, mild RVE and no mention of any residual ASD.    Marland Kitchen Abnormal Pap smear   . Gestational diabetes   . Infection     UTI  . Anxiety     on meds    Past Surgical History  Procedure Laterality Date  . Cardiac surgery      congenital heart defect repair at 9 mos  . Exploration post operative open heart    . Cesarean section N/A 10/06/2012    Procedure: Primary CESAREAN SECTION  of baby girl at 0347 APGAR 8/8;  Surgeon: Philip Aspen, DO;  Location: [facility];  Service: Obstetrics;  Laterality: N/A;  . Laparotomy N/A 10/07/2012    Procedure: EXPLORATORY LAPAROTOMY;  Surgeon: Freddrick March. Tenny Craw, MD;  Location: [facility];  Service: Gynecology;  Laterality: N/A;    Family History  Problem Relation Age of Onset  . Diabetes Mother   . Asthma Mother   . Diabetes Father   . Asthma Father   . Diabetes Maternal Grandmother   . Diabetes Paternal Grandfather     History  Substance Use Topics  . Smoking status: Former Games developer  . Smokeless tobacco: Never Used     Comment: quit 1st trimester  . Alcohol Use: Yes     Comment: occasional alcohol, not with preg     Allergies:  Allergies  Allergen Reactions  . Mucinex [Guaifenesin Er] Shortness Of Breath    Prescriptions prior to admission  Medication Sig Dispense Refill  . cephALEXin (KEFLEX) 500 MG capsule Take 1 capsule (500 mg total) by mouth 4 (four) times daily.  28 capsule  0  . FLUoxetine (PROZAC) 20 MG capsule Take 20 mg by mouth daily.      Marland Kitchen ibuprofen (ADVIL,MOTRIN) 200 MG tablet Take 200 mg by mouth every 6 (six) hours as needed for pain.      Marland Kitchen loratadine (CLARITIN) 10 MG tablet Take 10 mg by mouth daily.      Marland Kitchen oxyCODONE-acetaminophen (PERCOCET/ROXICET) 5-325 MG per tablet Take 1-2 tablets by mouth every 4 (four) hours as needed.  30 tablet  0  . phenylephrine (SUDAFED PE) 10 MG TABS Take 10 mg by mouth every 4 (four) hours as needed (congestion).      . Prenatal Vit-Fe Fumarate-FA (PRENATAL MULTIVITAMIN) TABS Take 1 tablet by mouth every morning.       . promethazine (PHENERGAN) 25 MG tablet Take 1 tablet (25 mg total) by mouth every 6 (six) hours as needed for nausea.  30 tablet  0  .  valACYclovir (VALTREX) 500 MG tablet Take 500 mg by mouth daily.         Review of Systems  Constitutional: Negative for fever and chills.  Respiratory: Positive for cough and shortness of breath. Negative for hemoptysis, sputum production and wheezing.   Cardiovascular: Negative for chest pain.  Gastrointestinal: Negative for nausea, vomiting and abdominal pain.   Physical Exam   Blood pressure 134/101, pulse 109, temperature 99 F (37.2 C), temperature source Oral, resp. rate 20, height 4' 10.75" (1.492 m), weight 163 lb 8 oz (74.163 kg), SpO2 99.00%, not currently breastfeeding.  Physical Exam  Nursing note and vitals reviewed. Constitutional: She is oriented to person, place, and time. She appears distressed.  HENT:  Head: Normocephalic.  Neck: Normal range of motion.  Cardiovascular: Regular rhythm.   Respiratory: Breath sounds normal.  pulling with inspiration  Neurological: She is  alert and oriented to person, place, and time.  Skin: Skin is warm and dry.  Psychiatric: She has a normal mood and affect. Her behavior is normal.   Results for orders placed during the hospital encounter of 10/31/12 (from the past 24 hour(s))  CBC WITH DIFFERENTIAL     Status: Abnormal   Collection Time    10/31/12  6:07 PM      Result Value Range   WBC 12.0 (*) 4.0 - 10.5 K/uL   RBC 3.79 (*) 3.87 - 5.11 MIL/uL   Hemoglobin 10.9 (*) 12.0 - 15.0 g/dL   HCT 78.2 (*) 95.6 - 21.3 %   MCV 91.8  78.0 - 100.0 fL   MCH 28.8  26.0 - 34.0 pg   MCHC 31.3  30.0 - 36.0 g/dL   RDW 08.6 (*) 57.8 - 46.9 %   Platelets 686 (*) 150 - 400 K/uL   Neutrophils Relative % 67  43 - 77 %   Neutro Abs 8.1 (*) 1.7 - 7.7 K/uL   Lymphocytes Relative 20  12 - 46 %   Lymphs Abs 2.4  0.7 - 4.0 K/uL   Monocytes Relative 11  3 - 12 %   Monocytes Absolute 1.4 (*) 0.1 - 1.0 K/uL   Eosinophils Relative 1  0 - 5 %   Eosinophils Absolute 0.1  0.0 - 0.7 K/uL   Basophils Relative 1  0 - 1 %   Basophils Absolute 0.1  0.0 - 0.1 K/uL  COMPREHENSIVE METABOLIC PANEL     Status: Abnormal   Collection Time    10/31/12  6:07 PM      Result Value Range   Sodium 136  135 - 145 mEq/L   Potassium 3.6  3.5 - 5.1 mEq/L   Chloride 100  96 - 112 mEq/L   CO2 22  19 - 32 mEq/L   Glucose, Bld 103 (*) 70 - 99 mg/dL   BUN 22  6 - 23 mg/dL   Creatinine, Ser 6.29  0.50 - 1.10 mg/dL   Calcium 8.4  8.4 - 52.8 mg/dL   Total Protein 6.1  6.0 - 8.3 g/dL   Albumin 2.5 (*) 3.5 - 5.2 g/dL   AST 413 (*) 0 - 37 U/L   ALT 587 (*) 0 - 35 U/L   Alkaline Phosphatase 387 (*) 39 - 117 U/L   Total Bilirubin 1.0  0.3 - 1.2 mg/dL   GFR calc non Af Amer >90  >90 mL/min   GFR calc Af Amer >90  >90 mL/min       *RADIOLOGY REPORT*  Clinical Data: 3  weeks postpartum. Shortness of breath with  productive cough.  CHEST - 2 VIEW  Comparison: 10/08/2012  Findings: Lungs are adequately inflated demonstrate moderate  perihilar bibasilar opacification  right worse than left which may  be due to edema versus multi focal pneumonia. No definite effusions  seen. Moderate stable cardiomegaly. Surgical clips over the  anterior/superior mediastinum unchanged. Remainder of the exam is  unchanged.  IMPRESSION:  Worsening bilateral perihilar bibasilar opacification right worse  than left suggesting multifocal pneumonia versus edema.  Stable cardiomegaly.  Original Report Authenticated By: Elberta Fortis, M.D.       Blood Pressures 122/95, 132/103, 134/93,  MAU Course  Procedures  MDM 19:55 Reported MSE , physical findings, VSS to Dr. Henderson Cloud.  She reports the patient has been seen in the office several times since surgery and all testing has been normal.  She reports the patient tends to worry over things.  She gave order for CBC diff, CMP, CXR and if patient continues with anxiety, may give Xanex 0.25mg .  I explained to the patient the plan of care.  She will be going to Radiology.  She accepts plan of care 19:30  I reported patients labs, CXR result and VSS.  Order given for  patient to be transferred to [facility] for further evaluation.    20:00 Dr. Henderson Cloud in to see patient.   Assessment and Plan  A:  Delivery by C-Section 7/29       Shortness of breath       CXR suggestive of multifocal pneumonia vs edema      Elevated LFT's      Elevated blood pressure  P:  Transfer to [facility] by Weyman Croon 10/31/2012, 5:50 PM

## 2012-10-31 NOTE — Consult Note (Signed)
Reason for Consult:abdominal pain, possible cholecystitis Referring Physician: Tavonna Worthington is an 27 y.o. female.  HPI: the patient is a 27 year old female who is approaching 4 weeks following C-section for preterm pregnancy due to rupture of membranes. She has a significant history of cardiac surgery as an infant but this has been stable with no symptoms that she is been released by her cardiologist. Postoperatively from her C-section she developed a large uterine hematoma and required laparotomy for this. She states that since discharge for about the past couple of weeks she has been having some intermittent epigastric abdominal pain. For the past week she has developed worsening cough, chest congestion and productive sputum and some shortness of breath as well. Also some fever. Over the last several days her epigastric pain has also become worse and is essentially constant. It is relieved somewhat by ibuprofen. She has had nausea and occasional vomiting. This is made worse by eating that eating does not affect her pain. She has had diarrhea. No melena or bleeding. She presented to the emergency department today due to worsening of all of the above symptoms. No urinary symptoms. No previous history of abdominal or GI complaints.  Past Medical History  Diagnosis Date  . Herpes genitalia   . HSV (herpes simplex virus) infection     last outbreak in may  . Mitral valve prolapse   . Status post repair of cor triatriatum     Echocardiogram 12/05: EF 55-65%, no mitral valve prolapse, mild RVE and no mention of any residual ASD.    Marland Kitchen Abnormal Pap smear   . Gestational diabetes   . Infection     UTI  . Anxiety     on meds    Past Surgical History  Procedure Laterality Date  . Cardiac surgery      congenital heart defect repair at 9 mos  . Exploration post operative open heart    . Cesarean section N/A 10/06/2012    Procedure: Primary CESAREAN SECTION  of baby girl at 0347 APGAR  8/8;  Surgeon: Philip Aspen, DO;  Location: [facility];  Service: Obstetrics;  Laterality: N/A;  . Laparotomy N/A 10/07/2012    Procedure: EXPLORATORY LAPAROTOMY;  Surgeon: Freddrick March. Tenny Craw, MD;  Location: [facility];  Service: Gynecology;  Laterality: N/A;    Family History  Problem Relation Age of Onset  . Diabetes Mother   . Asthma Mother   . Diabetes Father   . Asthma Father   . Diabetes Maternal Grandmother   . Diabetes Paternal Grandfather     Social History:  reports that she has quit smoking. She has never used smokeless tobacco. She reports that  drinks alcohol. She reports that she does not use illicit drugs.  Allergies:  Allergies  Allergen Reactions  . Mucinex [Guaifenesin Er] Shortness Of Breath    Current Facility-Administered Medications  Medication Dose Route Frequency Provider Last Rate Last Dose  . azithromycin (ZITHROMAX) 500 mg in dextrose 5 % 250 mL IVPB  500 mg Intravenous Once Roxy Horseman, PA-C      . cefTRIAXone (ROCEPHIN) 1 g in dextrose 5 % 50 mL IVPB  1 g Intravenous Once Roxy Horseman, PA-C       Current Outpatient Prescriptions  Medication Sig Dispense Refill  . cephALEXin (KEFLEX) 500 MG capsule Take 1 capsule (500 mg total) by mouth 4 (four) times daily.  28 capsule  0  . FLUoxetine (PROZAC) 20 MG capsule Take 20 mg by mouth daily.      Marland Kitchen  ibuprofen (ADVIL,MOTRIN) 200 MG tablet Take 200 mg by mouth every 6 (six) hours as needed for pain.      Marland Kitchen loratadine (CLARITIN) 10 MG tablet Take 10 mg by mouth daily.      Marland Kitchen oxyCODONE-acetaminophen (PERCOCET/ROXICET) 5-325 MG per tablet Take 1-2 tablets by mouth every 4 (four) hours as needed.  30 tablet  0  . phenylephrine (SUDAFED PE) 10 MG TABS Take 10 mg by mouth every 4 (four) hours as needed (congestion).      . Prenatal Vit-Fe Fumarate-FA (PRENATAL MULTIVITAMIN) TABS Take 1 tablet by mouth every morning.       . promethazine (PHENERGAN) 25 MG tablet Take 1 tablet (25 mg total) by mouth every 6 (six) hours as  needed for nausea.  30 tablet  0  . valACYclovir (VALTREX) 500 MG tablet Take 500 mg by mouth daily as needed (for outbreak).          Results for orders placed during the hospital encounter of 10/31/12 (from the past 48 hour(s))  CBC WITH DIFFERENTIAL     Status: Abnormal   Collection Time    10/31/12  6:07 PM      Result Value Range   WBC 12.0 (*) 4.0 - 10.5 K/uL   RBC 3.79 (*) 3.87 - 5.11 MIL/uL   Hemoglobin 10.9 (*) 12.0 - 15.0 g/dL   HCT 64.4 (*) 03.4 - 74.2 %   MCV 91.8  78.0 - 100.0 fL   MCH 28.8  26.0 - 34.0 pg   MCHC 31.3  30.0 - 36.0 g/dL   RDW 59.5 (*) 63.8 - 75.6 %   Platelets 686 (*) 150 - 400 K/uL   Neutrophils Relative % 67  43 - 77 %   Neutro Abs 8.1 (*) 1.7 - 7.7 K/uL   Lymphocytes Relative 20  12 - 46 %   Lymphs Abs 2.4  0.7 - 4.0 K/uL   Monocytes Relative 11  3 - 12 %   Monocytes Absolute 1.4 (*) 0.1 - 1.0 K/uL   Eosinophils Relative 1  0 - 5 %   Eosinophils Absolute 0.1  0.0 - 0.7 K/uL   Basophils Relative 1  0 - 1 %   Basophils Absolute 0.1  0.0 - 0.1 K/uL  COMPREHENSIVE METABOLIC PANEL     Status: Abnormal   Collection Time    10/31/12  6:07 PM      Result Value Range   Sodium 136  135 - 145 mEq/L   Potassium 3.6  3.5 - 5.1 mEq/L   Chloride 100  96 - 112 mEq/L   CO2 22  19 - 32 mEq/L   Glucose, Bld 103 (*) 70 - 99 mg/dL   BUN 22  6 - 23 mg/dL   Creatinine, Ser 4.33  0.50 - 1.10 mg/dL   Calcium 8.4  8.4 - 29.5 mg/dL   Total Protein 6.1  6.0 - 8.3 g/dL   Albumin 2.5 (*) 3.5 - 5.2 g/dL   AST 188 (*) 0 - 37 U/L   Comment: REPEATED TO VERIFY     RESULT CONFIRMED BY AUTOMATED DILUTION   ALT 587 (*) 0 - 35 U/L   Alkaline Phosphatase 387 (*) 39 - 117 U/L   Total Bilirubin 1.0  0.3 - 1.2 mg/dL   GFR calc non Af Amer >90  >90 mL/min   GFR calc Af Amer >90  >90 mL/min   Comment: (NOTE)     The eGFR has been calculated using the CKD  EPI equation.     This calculation has not been validated in all clinical situations.     eGFR's persistently <90 mL/min  signify possible Chronic Kidney     Disease.    Dg Chest 2 View  10/31/2012   *RADIOLOGY REPORT*  Clinical Data: 3 weeks postpartum.  Shortness of breath with productive cough.  CHEST - 2 VIEW  Comparison: 10/08/2012  Findings: Lungs are adequately inflated demonstrate moderate perihilar bibasilar opacification right worse than left which may be due to edema versus multi focal pneumonia. No definite effusions seen.  Moderate stable cardiomegaly.  Surgical clips over the anterior/superior mediastinum unchanged.  Remainder of the exam is unchanged.  IMPRESSION: Worsening bilateral perihilar bibasilar opacification right worse than left suggesting multifocal pneumonia versus edema.  Stable cardiomegaly.   Original Report Authenticated By: Elberta Fortis, M.D.   US Abdomen Complete  10/31/2012   *RADIOLOGY REPORT*  Clinical Data:  Right upper quadrant pain.  Elevated liver function studies.  COMPLETE ABDOMINAL ULTRASOUND  Comparison:  10/07/2012  Findings:  Gallbladder:  Diffuse gallbladder wall thickening and edema.  Wall thickness measures from between 4-7 mm.  No gallstones or sludge are visualized but there is a positive Murphy's sign.  Findings may represent inflammatory process or acalculus cholecystitis.  Common bile duct:  Normal caliber with measured diameter of 2 mm.  Liver:  Small amount of fluid is noted around the liver.  Compared with the previous CT scan, the amount of ascites appears to be decreased.  No focal liver lesions.  IVC:  Appears normal.  Pancreas:  No focal abnormality seen.  Spleen:  Spleen length measures 8.7 cm.  Normal parenchymal echotexture.  Right Kidney:  Right kidney measures 11.6 cm length.  No hydronephrosis.  Left Kidney:  Left kidney measures 12.8 cm length.  No hydronephrosis.  Abdominal aorta:  No aneurysm identified.  IMPRESSION: Gallbladder wall is thickened and edematous in the Murphy's sign is positive.  No stones or sludge are visualized.  Changes could represent  inflammatory process, edema, or acalculus cholecystitis. Small amount of fluid around the liver edge appears to be decreased since previous CT.   Original Report Authenticated By: Burman Nieves, M.D.    Review of Systems  Constitutional: Positive for fever and malaise/fatigue. Negative for chills.  Respiratory: Positive for cough, sputum production and shortness of breath. Negative for hemoptysis and wheezing.   Cardiovascular: Positive for leg swelling. Negative for chest pain, palpitations, orthopnea and PND.  Gastrointestinal: Positive for nausea, vomiting, abdominal pain and diarrhea. Negative for constipation and blood in stool.  Genitourinary: Negative.   Musculoskeletal: Negative.    Blood pressure 135/91, pulse 107, temperature 99.4 F (37.4 C), temperature source Oral, resp. rate 20, height 4' 10.75" (1.492 m), weight 163 lb 8 oz (74.163 kg), SpO2 94.00%, not currently breastfeeding. Physical Exam General: Alert, moderately obese young Caucasian female in no distress. Skin: Warm and dry without rash or infection. HEENT: No palpable masses or thyromegaly. Sclera nonicteric. Pupils equal round and reactive. Oropharynx clear. Lymph nodes: No cervical, supraclavicular, or inguinal nodes palpable. Lungs: Breath sounds coarse bilaterally and equal without increased work of breathing Cardiovascular: Regular rate and rhythm without murmur. No JVD or edema. Peripheral pulses intact. Abdomen: moderately obese.Nondistended.there is moderate tenderness in the epigastrium and right upper quadrant with some guarding. No masses palpable. No organomegaly. No palpable hernias. Pfannenstiel incision, healing Extremities: No edema or joint swelling or deformity. No chronic venous stasis changes. Neurologic: Alert and fully oriented.  Assessment/Plan: 27 year old female several weeks post urgent C-section complicated by uterine hematoma. She now presents with multiple symptoms including shortness  of breath and productive cough with chest x-ray showing apparent developing pneumonia versus edema. She also has epigastric and right upper quadrant pain and tenderness with significant elevated LFTs and gallbladder ultrasound showing thickening of the gallbladder wall but no stones. Incidentally a CT scan done at the time of her uterine hematoma mentioned gallbladder sludge. She may well have acalculus cholecystitis or cholecystitis with small stones.  Her LFTs however seemed elevated out of proportion to typical acute cholecystitis. Rule out hepatitis or other inflammatory process. The patient will be admitted by the hospitalist medical service and her pneumonia treated. Would include broad-spectrum antibiotics to cover GI organisms. Check hepatitis panel. I would not recommend emergency surgery with some doubt about the diagnosis and apparent developing pneumonia. Will follow closely.  Hendy Brindle T 10/31/2012, 11:13 PM

## 2012-10-31 NOTE — ED Notes (Signed)
Per [facility]: Pt is 4 weeks postpartum. Pt had one complications, two days postpartum with one stitch that broke and pt returned to OR. Today pt c/o chest pain and shortness of breath and was seen by PCP. [facility] Chest x-ray showed bilateral bibasilar opacities, and suggested multifocal pneumonia verses edema.

## 2012-10-31 NOTE — ED Notes (Signed)
Rn to return call for report

## 2012-11-01 ENCOUNTER — Inpatient Hospital Stay (HOSPITAL_COMMUNITY): Payer: Medicaid Other

## 2012-11-01 ENCOUNTER — Other Ambulatory Visit: Payer: Self-pay

## 2012-11-01 DIAGNOSIS — K819 Cholecystitis, unspecified: Secondary | ICD-10-CM

## 2012-11-01 DIAGNOSIS — R7989 Other specified abnormal findings of blood chemistry: Secondary | ICD-10-CM

## 2012-11-01 DIAGNOSIS — J189 Pneumonia, unspecified organism: Secondary | ICD-10-CM

## 2012-11-01 LAB — BASIC METABOLIC PANEL
BUN: 26 mg/dL — ABNORMAL HIGH (ref 6–23)
CO2: 22 mEq/L (ref 19–32)
Calcium: 8 mg/dL — ABNORMAL LOW (ref 8.4–10.5)
Creatinine, Ser: 0.67 mg/dL (ref 0.50–1.10)
GFR calc non Af Amer: >90 mL/min (ref >90)
Glucose, Bld: 115 mg/dL — ABNORMAL HIGH (ref 70–99)
Sodium: 137 mEq/L (ref 135–145)

## 2012-11-01 LAB — HEPATIC FUNCTION PANEL
ALT: 603 U/L — ABNORMAL HIGH (ref 0–35)
AST: 785 U/L — ABNORMAL HIGH (ref 0–37)
Albumin: 2.4 g/dL — ABNORMAL LOW (ref 3.5–5.2)
Bilirubin, Direct: 0.5 mg/dL — ABNORMAL HIGH (ref 0.0–0.3)

## 2012-11-01 LAB — RAPID URINE DRUG SCREEN, HOSP PERFORMED
Amphetamines: NOT DETECTED
Barbiturates: NOT DETECTED
Benzodiazepines: NOT DETECTED
Cocaine: NOT DETECTED
Opiates: NOT DETECTED
Tetrahydrocannabinol: NOT DETECTED

## 2012-11-01 LAB — URINALYSIS, ROUTINE W REFLEX MICROSCOPIC
Ketones, ur: NEGATIVE mg/dL
Nitrite: NEGATIVE
Specific Gravity, Urine: 1.035 — ABNORMAL HIGH (ref 1.005–1.030)
pH: 6 (ref 5.0–8.0)

## 2012-11-01 LAB — TROPONIN I: Troponin I: <0.3 ng/mL (ref <0.30)

## 2012-11-01 LAB — CBC WITH DIFFERENTIAL/PLATELET
Basophils Relative: 1 % (ref 0–1)
Eosinophils Absolute: 0 10*3/uL (ref 0.0–0.7)
Eosinophils Relative: 0 % (ref 0–5)
Hemoglobin: 11 g/dL — ABNORMAL LOW (ref 12.0–15.0)
Lymphs Abs: 1.9 10*3/uL (ref 0.7–4.0)
MCH: 28.6 pg (ref 26.0–34.0)
MCHC: 31.3 g/dL (ref 30.0–36.0)
MCV: 91.7 fL (ref 78.0–100.0)
Monocytes Absolute: 1.6 10*3/uL — ABNORMAL HIGH (ref 0.1–1.0)
Neutro Abs: 11.3 10*3/uL — ABNORMAL HIGH (ref 1.7–7.7)
Neutrophils Relative %: 75 % (ref 43–77)

## 2012-11-01 LAB — URINE MICROSCOPIC-ADD ON

## 2012-11-01 LAB — PRO B NATRIURETIC PEPTIDE: Pro B Natriuretic peptide (BNP): 12102 pg/mL — ABNORMAL HIGH (ref 0–125)

## 2012-11-01 LAB — ACETAMINOPHEN LEVEL: Acetaminophen (Tylenol), Serum: <15 ug/mL (ref 10–30)

## 2012-11-01 LAB — TSH: TSH: 1.9 u[IU]/mL (ref 0.350–4.500)

## 2012-11-01 LAB — GLUCOSE, CAPILLARY
Glucose-Capillary: 146 mg/dL — ABNORMAL HIGH (ref 70–99)
Glucose-Capillary: 100 mg/dL — ABNORMAL HIGH (ref 70–99)
Glucose-Capillary: 143 mg/dL — ABNORMAL HIGH (ref 70–99)

## 2012-11-01 MED ORDER — PIPERACILLIN-TAZOBACTAM 3.375 G IVPB 30 MIN
3.3750 g | Freq: Once | INTRAVENOUS | Status: AC
  Administered 2012-11-01: 3.375 g via INTRAVENOUS
  Filled 2012-11-01: qty 50

## 2012-11-01 MED ORDER — ONDANSETRON HCL 4 MG PO TABS: 4.0000 mg | ORAL_TABLET | Freq: Four times a day (QID) | ORAL | Status: DC | PRN

## 2012-11-01 MED ORDER — FUROSEMIDE 10 MG/ML IJ SOLN
20.0000 mg | Freq: Two times a day (BID) | INTRAMUSCULAR | Status: DC
  Administered 2012-11-01 – 2012-11-02 (×4): 20 mg via INTRAVENOUS
  Filled 2012-11-01 (×7): qty 2

## 2012-11-01 MED ORDER — MORPHINE SULFATE 2 MG/ML IJ SOLN
2.0000 mg | Freq: Once | INTRAMUSCULAR | Status: AC
  Administered 2012-11-01: 2 mg via INTRAVENOUS
  Filled 2012-11-01: qty 1

## 2012-11-01 MED ORDER — VANCOMYCIN HCL IN DEXTROSE 1-5 GM/200ML-% IV SOLN
1000.0000 mg | Freq: Three times a day (TID) | INTRAVENOUS | Status: DC
  Administered 2012-11-01 – 2012-11-02 (×5): 1000 mg via INTRAVENOUS
  Filled 2012-11-01 (×5): qty 200

## 2012-11-01 MED ORDER — VANCOMYCIN HCL IN DEXTROSE 1-5 GM/200ML-% IV SOLN
1000.0000 mg | Freq: Once | INTRAVENOUS | Status: AC
  Administered 2012-11-01: 1000 mg via INTRAVENOUS
  Filled 2012-11-01: qty 200

## 2012-11-01 MED ORDER — BACID PO TABS
2.0000 | ORAL_TABLET | Freq: Three times a day (TID) | ORAL | Status: DC
  Administered 2012-11-01: 2 via ORAL
  Filled 2012-11-01 (×5): qty 2

## 2012-11-01 MED ORDER — LOPERAMIDE HCL 2 MG PO CAPS
4.0000 mg | ORAL_CAPSULE | Freq: Once | ORAL | Status: AC
  Administered 2012-11-01: 4 mg via ORAL
  Filled 2012-11-01: qty 1

## 2012-11-01 MED ORDER — PIPERACILLIN-TAZOBACTAM 3.375 G IVPB
3.3750 g | Freq: Three times a day (TID) | INTRAVENOUS | Status: DC
  Administered 2012-11-01 – 2012-11-03 (×6): 3.375 g via INTRAVENOUS
  Filled 2012-11-01 (×7): qty 50

## 2012-11-01 MED ORDER — SODIUM CHLORIDE 0.9 % IV SOLN: INTRAVENOUS | Status: DC

## 2012-11-01 MED ORDER — SODIUM CHLORIDE 0.9 % IJ SOLN
3.0000 mL | Freq: Two times a day (BID) | INTRAMUSCULAR | Status: DC
  Administered 2012-11-02 – 2012-11-04 (×2): 3 mL via INTRAVENOUS

## 2012-11-01 MED ORDER — DEXTROSE-NACL 5-0.45 % IV SOLN
INTRAVENOUS | Status: DC
  Administered 2012-11-01: 50 mL/h via INTRAVENOUS

## 2012-11-01 MED ORDER — ONDANSETRON HCL 4 MG/2ML IJ SOLN
4.0000 mg | Freq: Four times a day (QID) | INTRAMUSCULAR | Status: DC | PRN
  Administered 2012-11-01 (×2): 4 mg via INTRAVENOUS
  Filled 2012-11-01 (×2): qty 2

## 2012-11-01 MED ORDER — OXYCODONE-ACETAMINOPHEN 5-325 MG PO TABS
1.0000 | ORAL_TABLET | ORAL | Status: DC | PRN
  Administered 2012-11-01 – 2012-11-02 (×2): 1 via ORAL
  Filled 2012-11-01 (×2): qty 1

## 2012-11-01 NOTE — Progress Notes (Signed)
ANTIBIOTIC CONSULT NOTE - INITIAL  Pharmacy Consult for Vancomycin and Zosyn Indication: HAP  Allergies  Allergen Reactions  . Mucinex [Guaifenesin Er] Shortness Of Breath    Patient Measurements: Height: 4\' 11"  (149.9 cm) Weight: 162 lb 11.2 oz (73.8 kg) IBW/kg (Calculated) : 43.2   Vital Signs: Temp: 98.7 F (37.1 C) (08/23 0145) Temp src: Oral (08/23 0145) BP: 137/99 mmHg (08/23 0145) Pulse Rate: 111 (08/23 0145) Intake/Output from previous day: 08/22 0701 - 08/23 0700 In: 250 [IV Piggyback:250] Out: -  Intake/Output from this shift: Total I/O In: 250 [IV Piggyback:250] Out: -   Labs:  Recent Labs  10/31/12 1807  WBC 12.0*  HGB 10.9*  PLT 686*  CREATININE 0.67   Estimated Creatinine Clearance: 93.2 ml/min (by C-G formula based on Cr of 0.67). No results found for this basename: VANCOTROUGH, Leodis Binet, VANCORANDOM, GENTTROUGH, GENTPEAK, GENTRANDOM, TOBRATROUGH, TOBRAPEAK, TOBRARND, AMIKACINPEAK, AMIKACINTROU, AMIKACIN,  in the last 72 hours   Microbiology: Recent Results (from the past 720 hour(s))  URINE CULTURE     Status: None   Collection Time    10/05/12 11:40 PM      Result Value Range Status   Specimen Description URINE, CATHETERIZED   Final   Special Requests NONE   Final   Culture  Setup Time 10/06/2012 14:01   Final   Colony Count >=100,000 COLONIES/ML   Final   Culture ESCHERICHIA COLI   Final   Report Status 10/07/2012 FINAL   Final   Organism ID, Bacteria ESCHERICHIA COLI   Final  MRSA PCR SCREENING     Status: Abnormal   Collection Time    10/07/12  2:50 PM      Result Value Range Status   MRSA by PCR INVALID RESULTS, SPECIMEN SENT FOR CULTURE (*) NEGATIVE Final   Comment:            The GeneXpert MRSA Assay (FDA     approved for NASAL specimens     only), is one component of a     comprehensive MRSA colonization     surveillance program. It is not     intended to diagnose MRSA     infection nor to guide or     monitor treatment  for     MRSA infections.     RCRV     SACHS,K AT 1650 ON 10/07/12 BY HAGGINSC     RESULT CALLED TO, READ BACK BY AND VERIFIED WITH:  MRSA CULTURE     Status: None   Collection Time    10/07/12  2:50 PM      Result Value Range Status   Specimen Description NASOPHARYNGEAL   Final   Special Requests NONE   Final   Culture     Final   Value: NO STAPHYLOCOCCUS AUREUS ISOLATED     Note: NOMRSA   Report Status 10/10/2012 FINAL   Final  URINE CULTURE     Status: None   Collection Time    10/16/12  3:25 PM      Result Value Range Status   Specimen Description URINE, CLEAN CATCH   Final   Special Requests NONE   Final   Culture  Setup Time     Final   Value: 10/17/2012 02:05     Performed at [facility] Count     Final   Value: 70,000 COLONIES/ML     Performed at [facility]   Culture     Final  Value: Multiple bacterial morphotypes present, none predominant. Suggest appropriate recollection if clinically indicated.     Performed at [facility]   Report Status 10/17/2012 FINAL   Final    Medical History: Past Medical History  Diagnosis Date  . Herpes genitalia   . HSV (herpes simplex virus) infection     last outbreak in may  . Mitral valve prolapse   . Status post repair of cor triatriatum     Echocardiogram 12/05: EF 55-65%, no mitral valve prolapse, mild RVE and no mention of any residual ASD.    Marland Kitchen Abnormal Pap smear   . Gestational diabetes   . Infection     UTI  . Anxiety     on meds  . Healthcare-associated pneumonia 10/31/2012    Medications:  Scheduled:  . sodium chloride  3 mL Intravenous Q12H  . vancomycin  1,000 mg Intravenous Once   Infusions:  . dextrose 5 % and 0.45% NaCl 50 mL/hr (11/01/12 0227)   Assessment: 27 yo s/p recent C-section.  Pt required multiple blood transfusions.  Presents today with HAP. Pt is breastfeeding.  Goal of Therapy:  Vancomycin trough level 15-20 mcg/ml  Plan:   Zosyn 3.375 Gm IV q8h  EI infusion  Vancomycin 1gm IV q8h.  CrCl~138 (N)  F/U SCr/levels/cultures  Susanne Greenhouse R 11/01/2012,2:33 AM

## 2012-11-01 NOTE — H&P (Addendum)
[facility] History and Physical  [name] WUJ:[id number] DOB: 1985/08/16 DOA: 10/31/2012  Referring physician: ER physician. PCP: Ron Parker, MD   Chief Complaint: Shortness of breath.  HPI: [name] is a 27 y.o. female who had recently had cesarean section last month and at that time patient had required at least 8 units of blood transfusion due to bleed presents to the ER at [facility] because of shortness of breath. In the ER patient also had labs done which showed elevated LFTs and was transferred to [facility]. Sonogram shows features concerning for acute acalculus cholecystitis. On-call surgeon was consulted. Patient chest x-ray was showing bilateral infiltrates concerning for pneumonia versus edema. Patient has been admitted for further management. Patient states that over the last one week patient has been having some nausea vomiting and abdominal discomfort. Patient's abdominal discomfort is mostly right upper quadrant and epigastric area. Patient last 2 days has been having increasing shortness of breath. Denies any fevers but had some subjective feeling of chills. Patient's shortness of breath is present even at rest and increase in exertion and has pleuritic type of chest pain. At this time patient has been admitted for further management.  OB/GYN fell that patient may not be having HELLP syndrome given the patient's elevated platelet counts.  Patient 2 weeks ago was given a course of antibiotics for possible endometritis by patient's gynecologist.  Patient has previous history of mitral valve prolapse and a 2-D echo done last year had low EF with inferior wall hypokinesis.  Review of Systems: As presented in the history of presenting illness, rest negative.  Past Medical History  Diagnosis Date  . Herpes genitalia   . HSV (herpes simplex virus) infection     last outbreak in may  . Mitral valve prolapse   . Status post repair of  cor triatriatum     Echocardiogram 12/05: EF 55-65%, no mitral valve prolapse, mild RVE and no mention of any residual ASD.    Marland Kitchen Abnormal Pap smear   . Gestational diabetes   . Infection     UTI  . Anxiety     on meds  . Healthcare-associated pneumonia 10/31/2012   Past Surgical History  Procedure Laterality Date  . Cardiac surgery      congenital heart defect repair at 9 mos  . Exploration post operative open heart    . Cesarean section N/A 10/06/2012    Procedure: Primary CESAREAN SECTION  of baby girl at 0347 APGAR 8/8;  Surgeon: Philip Aspen, DO;  Location: [facility];  Service: Obstetrics;  Laterality: N/A;  . Laparotomy N/A 10/07/2012    Procedure: EXPLORATORY LAPAROTOMY;  Surgeon: Freddrick March. Tenny Craw, MD;  Location: [facility];  Service: Gynecology;  Laterality: N/A;   Social History:  reports that she has quit smoking. She has never used smokeless tobacco. She reports that  drinks alcohol. She reports that she does not use illicit drugs. Home. where does patient live-- Can do ADLs. Can patient participate in ADLs?  Allergies  Allergen Reactions  . Mucinex [Guaifenesin Er] Shortness Of Breath    Family History  Problem Relation Age of Onset  . Diabetes Mother   . Asthma Mother   . Diabetes Father   . Asthma Father   . Diabetes Maternal Grandmother   . Diabetes Paternal Grandfather       Prior to Admission medications   Medication Sig Start Date End Date Taking? Authorizing Provider  cephALEXin (KEFLEX) 500 MG capsule Take 1  capsule (500 mg total) by mouth 4 (four) times daily. 10/06/12  Yes Heather Alger Memos, CNM  FLUoxetine (PROZAC) 20 MG capsule Take 20 mg by mouth daily.   Yes Historical Provider, MD  ibuprofen (ADVIL,MOTRIN) 200 MG tablet Take 200 mg by mouth every 6 (six) hours as needed for pain.   Yes Historical Provider, MD  loratadine (CLARITIN) 10 MG tablet Take 10 mg by mouth daily.   Yes Historical Provider, MD  oxyCODONE-acetaminophen (PERCOCET/ROXICET) 5-325 MG  per tablet Take 1-2 tablets by mouth every 4 (four) hours as needed. 10/11/12  Yes Loney Laurence, MD  phenylephrine (SUDAFED PE) 10 MG TABS Take 10 mg by mouth every 4 (four) hours as needed (congestion).   Yes Historical Provider, MD  Prenatal Vit-Fe Fumarate-FA (PRENATAL MULTIVITAMIN) TABS Take 1 tablet by mouth every morning.    Yes Historical Provider, MD  promethazine (PHENERGAN) 25 MG tablet Take 1 tablet (25 mg total) by mouth every 6 (six) hours as needed for nausea. 10/16/12  Yes Freddi Starr, PA-C  valACYclovir (VALTREX) 500 MG tablet Take 500 mg by mouth daily as needed (for outbreak).    Yes Historical Provider, MD   Physical Exam: Filed Vitals:   10/31/12 2027 10/31/12 2032 10/31/12 2037 10/31/12 2134  BP:    135/91  Pulse: 107 107 107 107  Temp:    99.4 F (37.4 C)  TempSrc:    Oral  Resp:    20  Height:      Weight:      SpO2: 95% 96% 96% 94%     General:  Well-developed and nourished.  Eyes: Anicteric no pallor.  ENT: No discharge from ears eyes nose mouth.  Neck: No mass felt.  Cardiovascular: S1-S2 heard.  Respiratory: Expiratory wheezes and crepitations heard.  Abdomen: Right upper quadrant and epigastric tenderness.  Skin: No rash.  Musculoskeletal: No edema.  Psychiatric: Appears normal.  Neurologic: Alert awake oriented to time place and person. Moves all extremities.  Labs on Admission:  Basic Metabolic Panel:  Recent Labs Lab 10/31/12 1807  NA 136  K 3.6  CL 100  CO2 22  GLUCOSE 103*  BUN 22  CREATININE 0.67  CALCIUM 8.4   Liver Function Tests:  Recent Labs Lab 10/31/12 1807  AST 982*  ALT 587*  ALKPHOS 387*  BILITOT 1.0  PROT 6.1  ALBUMIN 2.5*   No results found for this basename: LIPASE, AMYLASE,  in the last 168 hours No results found for this basename: AMMONIA,  in the last 168 hours CBC:  Recent Labs Lab 10/31/12 1807  WBC 12.0*  NEUTROABS 8.1*  HGB 10.9*  HCT 34.8*  MCV 91.8  PLT 686*   Cardiac  Enzymes: No results found for this basename: CKTOTAL, CKMB, CKMBINDEX, TROPONINI,  in the last 168 hours  BNP (last 3 results) No results found for this basename: PROBNP,  in the last 8760 hours CBG: No results found for this basename: GLUCAP,  in the last 168 hours  Radiological Exams on Admission: Dg Chest 2 View  10/31/2012   *RADIOLOGY REPORT*  Clinical Data: 3 weeks postpartum.  Shortness of breath with productive cough.  CHEST - 2 VIEW  Comparison: 10/08/2012  Findings: Lungs are adequately inflated demonstrate moderate perihilar bibasilar opacification right worse than left which may be due to edema versus multi focal pneumonia. No definite effusions seen.  Moderate stable cardiomegaly.  Surgical clips over the anterior/superior mediastinum unchanged.  Remainder of the exam is unchanged.  IMPRESSION: Worsening bilateral perihilar bibasilar opacification right worse than left suggesting multifocal pneumonia versus edema.  Stable cardiomegaly.   Original Report Authenticated By: Elberta Fortis, M.D.   US Abdomen Complete  10/31/2012   *RADIOLOGY REPORT*  Clinical Data:  Right upper quadrant pain.  Elevated liver function studies.  COMPLETE ABDOMINAL ULTRASOUND  Comparison:  10/07/2012  Findings:  Gallbladder:  Diffuse gallbladder wall thickening and edema.  Wall thickness measures from between 4-7 mm.  No gallstones or sludge are visualized but there is a positive Murphy's sign.  Findings may represent inflammatory process or acalculus cholecystitis.  Common bile duct:  Normal caliber with measured diameter of 2 mm.  Liver:  Small amount of fluid is noted around the liver.  Compared with the previous CT scan, the amount of ascites appears to be decreased.  No focal liver lesions.  IVC:  Appears normal.  Pancreas:  No focal abnormality seen.  Spleen:  Spleen length measures 8.7 cm.  Normal parenchymal echotexture.  Right Kidney:  Right kidney measures 11.6 cm length.  No hydronephrosis.  Left Kidney:   Left kidney measures 12.8 cm length.  No hydronephrosis.  Abdominal aorta:  No aneurysm identified.  IMPRESSION: Gallbladder wall is thickened and edematous in the Murphy's sign is positive.  No stones or sludge are visualized.  Changes could represent inflammatory process, edema, or acalculus cholecystitis. Small amount of fluid around the liver edge appears to be decreased since previous CT.   Original Report Authenticated By: Burman Nieves, M.D.     Assessment/Plan Principal Problem:   Healthcare-associated pneumonia Active Problems:   Elevated LFTs   Cholecystitis   1. Shortness of breath - most likely cause could be pneumonia given the productive cough and leukocytosis. Since patient also had low EF previously CHF is in the differentials. At this time patient has been started on empiric antibiotics for possible health care associated pneumonia. Blood cultures have been ordered urine strep antigen and Legionella also has been ordered. Check BNP troponin and EKG. Check 2-D echo. Closely follow him telemetry as patient at this time is not in acute distress and is able to saturate well at room air. 2. Cholecystitis - patient is presently placed n.p.o. and on antibiotics. Further recommendations per surgery. 3. Elevated LFTs - most likely secondary to #2. Could also be from developing sepsis. Gynecologist feels patient may not be having HELLP syndrome. Check acute hepatitis panel, Tylenol levels and LDH. 4. Anemia - patient had recently during her C-section had received 8 units of blood last month. Closely follow CBC. 5. History of mitral valve prolapse - follow 2-D echo. 6. Recent C-section. 7. History of elevated blood pressure - closely follow blood pressure trends. 8. Tobacco abuse - tobacco cessation counseling requested.  Addendum - patient's BNP and LDH levels have come high. At this time I will also cycle cardiac markers and check CK levels. Check EKG. Check peripheral smear study due  to LDH be high and closely follow CBC with differentials. Patient does not have any renal failure or thrombocytopenia this time. I have discussed with floor cover person Ms.Schorr  to check the EKG. Closely follow intake output and respiratory status. Repeat chest x-ray in a.m.    Code Status: Full code.  Family Communication: Patient's family at the bedside.  Disposition Plan: Admit to inpatient.    Eman Morimoto N. [facility] Pager [phone].  If 7PM-7AM, please contact night-coverage [email or web address] Password [facility] 11/01/2012, 12:05 AM

## 2012-11-01 NOTE — Progress Notes (Addendum)
[facility] PROGRESS NOTE  Assessment/Plan: *Healthcare-associated pneumonia: - Vanc and zosyn started on 8.23.2014, afebrile with mild leukocytosis. - + JVD, repeat Echo, start lasix strict I and O's,  - Cultures pending. - repeat CXR in am.  Elevated LFTs - Mild elevation. - Check hepatitis panel. - Surgery does no think is due to her gallbladder.   Code Status: full Family Communication: none  Disposition Plan: inaptient   Consultants:  surgery  Procedures:  [place]  CXR  Antibiotics:  vanc and zosyn 8.23.2014  HPI/Subjective: No complains. Except for cough  Objective: Filed Vitals:   10/31/12 2037 10/31/12 2134 11/01/12 0145 11/01/12 0700  BP:  135/91 137/99 113/75  Pulse: 107 107 111 128  Temp:  99.4 F (37.4 C) 98.7 F (37.1 C) 97.6 F (36.4 C)  TempSrc:  Oral Oral Oral  Resp:  20 20 20   Height:   4\' 11"  (1.499 m)   Weight:   73.8 kg (162 lb 11.2 oz)   SpO2: 96% 94% 98% 96%    Intake/Output Summary (Last 24 hours) at 11/01/12 0903 Last data filed at 11/01/12 0600  Gross per 24 hour  Intake  337.5 ml  Output    225 ml  Net  112.5 ml   Filed Weights   10/31/12 1716 11/01/12 0145  Weight: 74.163 kg (163 lb 8 oz) 73.8 kg (162 lb 11.2 oz)    Exam:  General: Alert, awake, oriented x3, in no acute distress.  HEENT: No bruits, no goiter. +JVD Heart: Regular rate and rhythm, without murmurs, rubs, gallops.  Lungs: Good air movement, crackles b/l on lower lung with ronchi more pronounce on right. Abdomen: Soft, nontender, nondistended, positive bowel sounds.  Neuro: Grossly intact, nonfocal.   Data Reviewed: Basic Metabolic Panel:  Recent Labs Lab 10/31/12 1807 11/01/12 0534  NA 136 137  K 3.6 3.5  CL 100 102  CO2 22 22  GLUCOSE 103* 115*  BUN 22 26*  CREATININE 0.67 0.67  CALCIUM 8.4 8.0*   Liver Function Tests:  Recent Labs Lab 10/31/12 1807 11/01/12 0534  AST 982* 785*  ALT 587* 603*  ALKPHOS 387* 349*  BILITOT 1.0  1.2  PROT 6.1 5.8*  ALBUMIN 2.5* 2.4*    Recent Labs Lab 11/01/12 0058  LIPASE 50   No results found for this basename: AMMONIA,  in the last 168 hours CBC:  Recent Labs Lab 10/31/12 1807 11/01/12 0534  WBC 12.0* 14.9*  NEUTROABS 8.1* 11.3*  HGB 10.9* 11.0*  HCT 34.8* 35.2*  MCV 91.8 91.7  PLT 686* 639*   Cardiac Enzymes:  Recent Labs Lab 11/01/12 0015 11/01/12 0810  CKTOTAL 139  --   TROPONINI <0.30 <0.30   BNP (last 3 results)  Recent Labs  11/01/12 0015  PROBNP 12102.0*   CBG:  Recent Labs Lab 11/01/12 0801  GLUCAP 104*    No results found for this or any previous visit (from the past 240 hour(s)).   Studies: Dg Chest 2 View  10/31/2012   *RADIOLOGY REPORT*  Clinical Data: 3 weeks postpartum.  Shortness of breath with productive cough.  CHEST - 2 VIEW  Comparison: 10/08/2012  Findings: Lungs are adequately inflated demonstrate moderate perihilar bibasilar opacification right worse than left which may be due to edema versus multi focal pneumonia. No definite effusions seen.  Moderate stable cardiomegaly.  Surgical clips over the anterior/superior mediastinum unchanged.  Remainder of the exam is unchanged.  IMPRESSION: Worsening bilateral perihilar bibasilar opacification right worse than left suggesting  multifocal pneumonia versus edema.  Stable cardiomegaly.   Original Report Authenticated By: Elberta Fortis, M.D.   US Abdomen Complete  10/31/2012   *RADIOLOGY REPORT*  Clinical Data:  Right upper quadrant pain.  Elevated liver function studies.  COMPLETE ABDOMINAL ULTRASOUND  Comparison:  10/07/2012  Findings:  Gallbladder:  Diffuse gallbladder wall thickening and edema.  Wall thickness measures from between 4-7 mm.  No gallstones or sludge are visualized but there is a positive Murphy's sign.  Findings may represent inflammatory process or acalculus cholecystitis.  Common bile duct:  Normal caliber with measured diameter of 2 mm.  Liver:  Small amount of fluid  is noted around the liver.  Compared with the previous CT scan, the amount of ascites appears to be decreased.  No focal liver lesions.  IVC:  Appears normal.  Pancreas:  No focal abnormality seen.  Spleen:  Spleen length measures 8.7 cm.  Normal parenchymal echotexture.  Right Kidney:  Right kidney measures 11.6 cm length.  No hydronephrosis.  Left Kidney:  Left kidney measures 12.8 cm length.  No hydronephrosis.  Abdominal aorta:  No aneurysm identified.  IMPRESSION: Gallbladder wall is thickened and edematous in the Murphy's sign is positive.  No stones or sludge are visualized.  Changes could represent inflammatory process, edema, or acalculus cholecystitis. Small amount of fluid around the liver edge appears to be decreased since previous CT.   Original Report Authenticated By: Burman Nieves, M.D.   Dg Chest Port 1 View  11/01/2012   *RADIOLOGY REPORT*  Clinical Data: Short of breath.  Infiltrates.  PORTABLE CHEST - 1 VIEW  Comparison: 10/31/2012  Findings: Patchy bilateral central lung infiltrates are stable.  Cardiac silhouette is mild to moderately enlarged.  No mediastinal or hilar masses are noted.  There is no pneumothorax or convincing pleural effusion.  IMPRESSION: Persistent bilateral central lung airspace opacities.  This is consistent with multifocal pneumonia but could reflect pulmonary edema.   Original Report Authenticated By: Amie Portland, M.D.    Scheduled Meds: . piperacillin-tazobactam (ZOSYN)  IV  3.375 g Intravenous Q8H  . sodium chloride  3 mL Intravenous Q12H  . vancomycin  1,000 mg Intravenous Q8H   Continuous Infusions: . dextrose 5 % and 0.45% NaCl 20 mL/hr at 11/01/12 0300     FELIZ Rosine Beat  [facility] Pager [phone]. If 8PM-8AM, please contact night-coverage at [email or web address], password [facility] 11/01/2012, 9:03 AM  LOS: 1 day

## 2012-11-01 NOTE — Progress Notes (Signed)
Subjective: Feels about the same.   Objective: Vital signs in last 24 hours: Temp:  [97.6 F (36.4 C)-101 F (38.3 C)] 97.6 F (36.4 C) (08/23 0700) Pulse Rate:  [96-165] 128 (08/23 0700) Resp:  [20-21] 20 (08/23 0700) BP: (113-138)/(68-103) 113/75 mmHg (08/23 0700) SpO2:  [92 %-99 %] 96 % (08/23 0700) Weight:  [162 lb 11.2 oz (73.8 kg)-163 lb 8 oz (74.163 kg)] 162 lb 11.2 oz (73.8 kg) (08/23 0145)    Intake/Output from previous day: 08/22 0701 - 08/23 0700 In: 337.5 [I.V.:87.5; IV Piggyback:250] Out: 225 [Urine:175; Emesis/NG output:50] Intake/Output this shift:    GI: mild RUQ tenderness.  soft  Lab Results:   Recent Labs  10/31/12 1807 11/01/12 0534  WBC 12.0* 14.9*  HGB 10.9* 11.0*  HCT 34.8* 35.2*  PLT 686* 639*   BMET  Recent Labs  10/31/12 1807 11/01/12 0534  NA 136 137  K 3.6 3.5  CL 100 102  CO2 22 22  GLUCOSE 103* 115*  BUN 22 26*  CREATININE 0.67 0.67  CALCIUM 8.4 8.0*   PT/INR  Recent Labs  11/01/12 0058  LABPROT 16.4*  INR 1.36   ABG No results found for this basename: PHART, PCO2, PO2, HCO3,  in the last 72 hours  Studies/Results: Dg Chest 2 View  10/31/2012   *RADIOLOGY REPORT*  Clinical Data: 3 weeks postpartum.  Shortness of breath with productive cough.  CHEST - 2 VIEW  Comparison: 10/08/2012  Findings: Lungs are adequately inflated demonstrate moderate perihilar bibasilar opacification right worse than left which may be due to edema versus multi focal pneumonia. No definite effusions seen.  Moderate stable cardiomegaly.  Surgical clips over the anterior/superior mediastinum unchanged.  Remainder of the exam is unchanged.  IMPRESSION: Worsening bilateral perihilar bibasilar opacification right worse than left suggesting multifocal pneumonia versus edema.  Stable cardiomegaly.   Original Report Authenticated By: Elberta Fortis, M.D.   US Abdomen Complete  10/31/2012   *RADIOLOGY REPORT*  Clinical Data:  Right upper quadrant pain.   Elevated liver function studies.  COMPLETE ABDOMINAL ULTRASOUND  Comparison:  10/07/2012  Findings:  Gallbladder:  Diffuse gallbladder wall thickening and edema.  Wall thickness measures from between 4-7 mm.  No gallstones or sludge are visualized but there is a positive Murphy's sign.  Findings may represent inflammatory process or acalculus cholecystitis.  Common bile duct:  Normal caliber with measured diameter of 2 mm.  Liver:  Small amount of fluid is noted around the liver.  Compared with the previous CT scan, the amount of ascites appears to be decreased.  No focal liver lesions.  IVC:  Appears normal.  Pancreas:  No focal abnormality seen.  Spleen:  Spleen length measures 8.7 cm.  Normal parenchymal echotexture.  Right Kidney:  Right kidney measures 11.6 cm length.  No hydronephrosis.  Left Kidney:  Left kidney measures 12.8 cm length.  No hydronephrosis.  Abdominal aorta:  No aneurysm identified.  IMPRESSION: Gallbladder wall is thickened and edematous in the Murphy's sign is positive.  No stones or sludge are visualized.  Changes could represent inflammatory process, edema, or acalculus cholecystitis. Small amount of fluid around the liver edge appears to be decreased since previous CT.   Original Report Authenticated By: Burman Nieves, M.D.   Dg Chest Port 1 View  11/01/2012   *RADIOLOGY REPORT*  Clinical Data: Short of breath.  Infiltrates.  PORTABLE CHEST - 1 VIEW  Comparison: 10/31/2012  Findings: Patchy bilateral central lung infiltrates are stable.  Cardiac silhouette  is mild to moderately enlarged.  No mediastinal or hilar masses are noted.  There is no pneumothorax or convincing pleural effusion.  IMPRESSION: Persistent bilateral central lung airspace opacities.  This is consistent with multifocal pneumonia but could reflect pulmonary edema.   Original Report Authenticated By: Amie Portland, M.D.    Anti-infectives: Anti-infectives   Start     Dose/Rate Route Frequency Ordered Stop    11/01/12 1000  piperacillin-tazobactam (ZOSYN) IVPB 3.375 g     3.375 g 12.5 mL/hr over 240 Minutes Intravenous Every 8 hours 11/01/12 0244     11/01/12 1000  vancomycin (VANCOCIN) IVPB 1000 mg/200 mL premix     1,000 mg 200 mL/hr over 60 Minutes Intravenous Every 8 hours 11/01/12 0244     11/01/12 0030  piperacillin-tazobactam (ZOSYN) IVPB 3.375 g     3.375 g 100 mL/hr over 30 Minutes Intravenous  Once 11/01/12 0006 11/01/12 0142   11/01/12 0030  vancomycin (VANCOCIN) IVPB 1000 mg/200 mL premix     1,000 mg 200 mL/hr over 60 Minutes Intravenous  Once 11/01/12 0006 11/01/12 0305   10/31/12 2330  cefTRIAXone (ROCEPHIN) 1 g in dextrose 5 % 50 mL IVPB  Status:  Discontinued     1 g 100 mL/hr over 30 Minutes Intravenous  Once 10/31/12 2328 10/31/12 2358   10/31/12 2330  azithromycin (ZITHROMAX) 500 mg in dextrose 5 % 250 mL IVPB  Status:  Discontinued     500 mg 250 mL/hr over 60 Minutes Intravenous  Once 10/31/12 2328 10/31/12 2358      Assessment/Plan: s LOS: 1 day  Patient Active Problem List   Diagnosis Date Noted  . Healthcare-associated pneumonia 10/31/2012  . Elevated LFTs 10/31/2012  . Cholecystitis 10/31/2012  . Normal IUP (intrauterine pregnancy) on prenatal ultrasound 07/05/2011  . Subchorionic hemorrhage 07/05/2011  . Tobacco use 04/18/2011  . Chest pain, unspecified 03/19/2011  . Junctional rhythm 03/19/2011  . 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 Normal labor and delivery 09/24/2010  . No pertinent past medical history   . No pertinent past medical history   Not sure she has cholecystitis.  Seems about the same.  Cont ABX through the weekend and see how she does.  No stones and liver enzyme pattern more like hepatitis that gallbladder disease. Can give clear liquids at this point since no surgery planned for today.  Hopefully this gets better as pneumonia improves.  Will follow  Tomasz Steeves A. 11/01/2012

## 2012-11-01 NOTE — ED Notes (Signed)
Per Fredrich Romans pt is telemetry

## 2012-11-01 NOTE — Progress Notes (Signed)
I was asked to see pt s/p transfer from ED. Dr Toniann Fail request I f/u on results of troponin and 12-lead EKG. Ms Higley is a 27 y/o female w/ a h/o repair of cor triatriatum at 9 mo, c/s delivery 4 weeks ago w/ post-op PP hemorrhage and acute blood loss anemia. She presented to [facility] on 10/31/12 w/ c/o persistent SOB and non-productive cough. CXR there revealed findings concerning for HAP vs edema. She was sent to [facility] for further eval and was noted to have elevated LFT's and abd u/s findings suspicious for cholecystitis. Pt made NPO for surgical eval in am. BNP elevated at 12,102.0. EKG reveals a new (R) axis but no other acute changes. Troponin negative. Total CK 139. At bedside pt noted resting in NAD. She denies CP or abd pain at this time. She does admit to persistent chest "congestion"  and mild cough which she states she has had for approx 1 week. She is mildly tachycardic 106-110). Remaining VS T-98.7, R-20 w/ 02 sats of 98% on r/a. New findings concerning for PP CHF. I have discussed pt w/ Dr Julian Reil who has also reviewed EKG's. Will defer cardiology consult or other changes in pt's plan of care to rounding MD in am given pt is hemodynamically stable and w/o CP or SOB. Will continue to monitor closely on telemetry.  Leanne Chang, NP-C [facility] Pager [phone]

## 2012-11-01 NOTE — ED Provider Notes (Signed)
Medical screening examination/treatment/procedure(s) were performed by non-physician practitioner and as supervising physician I was immediately available for consultation/collaboration.  Pt sent from [facility] with CXR concerning for multifocal pna, but also found to have LFT elevations.  RUQ [place] here showed GB wall thickening w/ +murphy's.  Surgery consulted, recommended medical admit w/ surgery consult.   Shanna Cisco, MD 11/01/12 1115

## 2012-11-01 NOTE — ED Notes (Signed)
Spoke with Amy in main lab regarding insuffiencey amount of blood collect by both phlebotomist and she states will send someone up to collect.

## 2012-11-02 DIAGNOSIS — I059 Rheumatic mitral valve disease, unspecified: Secondary | ICD-10-CM

## 2012-11-02 LAB — HEPATITIS PANEL, ACUTE: Hepatitis B Surface Ag: NEGATIVE

## 2012-11-02 LAB — GLUCOSE, CAPILLARY
Glucose-Capillary: 107 mg/dL — ABNORMAL HIGH (ref 70–99)
Glucose-Capillary: 110 mg/dL — ABNORMAL HIGH (ref 70–99)
Glucose-Capillary: 114 mg/dL — ABNORMAL HIGH (ref 70–99)
Glucose-Capillary: 87 mg/dL (ref 70–99)
Glucose-Capillary: 99 mg/dL (ref 70–99)
Glucose-Capillary: 99 mg/dL (ref 70–99)

## 2012-11-02 LAB — BASIC METABOLIC PANEL
CO2: 25 mEq/L (ref 19–32)
Chloride: 101 mEq/L (ref 96–112)
Creatinine, Ser: 0.83 mg/dL (ref 0.50–1.10)
Potassium: 3.3 mEq/L — ABNORMAL LOW (ref 3.5–5.1)
Sodium: 136 mEq/L (ref 135–145)

## 2012-11-02 LAB — LEGIONELLA ANTIGEN, URINE

## 2012-11-02 MED ORDER — POTASSIUM CHLORIDE CRYS ER 20 MEQ PO TBCR
40.0000 meq | EXTENDED_RELEASE_TABLET | Freq: Two times a day (BID) | ORAL | Status: AC
  Administered 2012-11-02 (×2): 40 meq via ORAL
  Filled 2012-11-02 (×2): qty 2

## 2012-11-02 MED ORDER — VANCOMYCIN HCL IN DEXTROSE 1-5 GM/200ML-% IV SOLN
1000.0000 mg | Freq: Two times a day (BID) | INTRAVENOUS | Status: DC
  Filled 2012-11-02: qty 200

## 2012-11-02 MED ORDER — LACTINEX PO CHEW
2.0000 | CHEWABLE_TABLET | Freq: Three times a day (TID) | ORAL | Status: DC
  Administered 2012-11-02 – 2012-11-04 (×8): 2 via ORAL
  Filled 2012-11-02 (×9): qty 2

## 2012-11-02 NOTE — Progress Notes (Signed)
CRITICAL VALUE ALERT  Critical value received:  vancomycin  Date of notification: 11/02/12  Time of notification:  1820  Critical value read back:yes Nurse who received alert:  Tamala Bari  MD notified (1st page):  pharamacy tom  Time of first page: 1822  MD notified (2nd page):  Time of second page:  Responding MD:    Time MD responded:

## 2012-11-02 NOTE — Progress Notes (Signed)
[facility] PROGRESS NOTE  Assessment/Plan: *Healthcare-associated pneumonia: - Vanc and zosyn started on 8.23.2014, afebrile with mild leukocytosis. - + JVD, repeat Echo, start lasix strict I and O's,  - Cultures pending.   Elevated LFTs - Mild elevation. - hepatitis panel pending. - Surgery does no think is due to her gallbladder.   Code Status: full Family Communication: none  Disposition Plan: inaptient   Consultants:  surgery  Procedures:  [place]  CXR  Antibiotics:  vanc and zosyn 8.23.2014  HPI/Subjective: Feels better. Was able to sleep  Objective: Filed Vitals:   11/01/12 1312 11/01/12 2012 11/02/12 0434 11/02/12 0500  BP: 118/91 125/81 111/75   Pulse: 95 97 92   Temp: 98.4 F (36.9 C) 98.9 F (37.2 C) 98.1 F (36.7 C)   TempSrc: Oral Oral Oral   Resp: 18 18 18    Height:      Weight:    73.5 kg (162 lb 0.6 oz)  SpO2: 97% 99% 94%     Intake/Output Summary (Last 24 hours) at 11/02/12 0802 Last data filed at 11/02/12 1610  Gross per 24 hour  Intake 1394.67 ml  Output    800 ml  Net 594.67 ml   Filed Weights   10/31/12 1716 11/01/12 0145 11/02/12 0500  Weight: 74.163 kg (163 lb 8 oz) 73.8 kg (162 lb 11.2 oz) 73.5 kg (162 lb 0.6 oz)    Exam:  General: Alert, awake, oriented x3, in no acute distress.  HEENT: No bruits, no goiter. +JVD Heart: Regular rate and rhythm, without murmurs, rubs, gallops.  Lungs: Good air movement, crackles b/l on lower lung with ronchi more pronounce on right. Abdomen: Soft, nontender, nondistended, positive bowel sounds.    Data Reviewed: Basic Metabolic Panel:  Recent Labs Lab 10/31/12 1807 11/01/12 0534 11/02/12 0520  NA 136 137 136  K 3.6 3.5 3.3*  CL 100 102 101  CO2 22 22 25   GLUCOSE 103* 115* 101*  BUN 22 26* 23  CREATININE 0.67 0.67 0.83  CALCIUM 8.4 8.0* 7.9*   Liver Function Tests:  Recent Labs Lab 10/31/12 1807 11/01/12 0534  AST 982* 785*  ALT 587* 603*  ALKPHOS 387* 349*   BILITOT 1.0 1.2  PROT 6.1 5.8*  ALBUMIN 2.5* 2.4*    Recent Labs Lab 11/01/12 0058  LIPASE 50   No results found for this basename: AMMONIA,  in the last 168 hours CBC:  Recent Labs Lab 10/31/12 1807 11/01/12 0534  WBC 12.0* 14.9*  NEUTROABS 8.1* 11.3*  HGB 10.9* 11.0*  HCT 34.8* 35.2*  MCV 91.8 91.7  PLT 686* 639*   Cardiac Enzymes:  Recent Labs Lab 11/01/12 0015 11/01/12 0810 11/01/12 1407  CKTOTAL 139  --   --   TROPONINI <0.30 <0.30 <0.30   BNP (last 3 results)  Recent Labs  11/01/12 0015  PROBNP 12102.0*   CBG:  Recent Labs Lab 11/01/12 1654 11/01/12 2010 11/02/12 0111 11/02/12 0430 11/02/12 0714  GLUCAP 100* 143* 110* 99 99    No results found for this or any previous visit (from the past 240 hour(s)).   Studies: Dg Chest 2 View  10/31/2012   *RADIOLOGY REPORT*  Clinical Data: 3 weeks postpartum.  Shortness of breath with productive cough.  CHEST - 2 VIEW  Comparison: 10/08/2012  Findings: Lungs are adequately inflated demonstrate moderate perihilar bibasilar opacification right worse than left which may be due to edema versus multi focal pneumonia. No definite effusions seen.  Moderate stable cardiomegaly.  Surgical clips  over the anterior/superior mediastinum unchanged.  Remainder of the exam is unchanged.  IMPRESSION: Worsening bilateral perihilar bibasilar opacification right worse than left suggesting multifocal pneumonia versus edema.  Stable cardiomegaly.   Original Report Authenticated By: Elberta Fortis, M.D.   US Abdomen Complete  10/31/2012   *RADIOLOGY REPORT*  Clinical Data:  Right upper quadrant pain.  Elevated liver function studies.  COMPLETE ABDOMINAL ULTRASOUND  Comparison:  10/07/2012  Findings:  Gallbladder:  Diffuse gallbladder wall thickening and edema.  Wall thickness measures from between 4-7 mm.  No gallstones or sludge are visualized but there is a positive Murphy's sign.  Findings may represent inflammatory process or  acalculus cholecystitis.  Common bile duct:  Normal caliber with measured diameter of 2 mm.  Liver:  Small amount of fluid is noted around the liver.  Compared with the previous CT scan, the amount of ascites appears to be decreased.  No focal liver lesions.  IVC:  Appears normal.  Pancreas:  No focal abnormality seen.  Spleen:  Spleen length measures 8.7 cm.  Normal parenchymal echotexture.  Right Kidney:  Right kidney measures 11.6 cm length.  No hydronephrosis.  Left Kidney:  Left kidney measures 12.8 cm length.  No hydronephrosis.  Abdominal aorta:  No aneurysm identified.  IMPRESSION: Gallbladder wall is thickened and edematous in the Murphy's sign is positive.  No stones or sludge are visualized.  Changes could represent inflammatory process, edema, or acalculus cholecystitis. Small amount of fluid around the liver edge appears to be decreased since previous CT.   Original Report Authenticated By: Burman Nieves, M.D.   Dg Chest Port 1 View  11/01/2012   *RADIOLOGY REPORT*  Clinical Data: Short of breath.  Infiltrates.  PORTABLE CHEST - 1 VIEW  Comparison: 10/31/2012  Findings: Patchy bilateral central lung infiltrates are stable.  Cardiac silhouette is mild to moderately enlarged.  No mediastinal or hilar masses are noted.  There is no pneumothorax or convincing pleural effusion.  IMPRESSION: Persistent bilateral central lung airspace opacities.  This is consistent with multifocal pneumonia but could reflect pulmonary edema.   Original Report Authenticated By: Amie Portland, M.D.    Scheduled Meds: . furosemide  20 mg Intravenous Q12H  . lactobacillus acidophilus  2 tablet Oral TID  . piperacillin-tazobactam (ZOSYN)  IV  3.375 g Intravenous Q8H  . potassium chloride  40 mEq Oral BID  . sodium chloride  3 mL Intravenous Q12H  . vancomycin  1,000 mg Intravenous Q8H   Continuous Infusions: . dextrose 5 % and 0.45% NaCl 20 mL/hr at 11/02/12 0614     Marinda Elk  [facility] Pager [phone]. If 8PM-8AM, please contact night-coverage at [email or web address], password [facility] 11/02/2012, 8:02 AM  LOS: 2 days

## 2012-11-02 NOTE — Progress Notes (Signed)
ANTIBIOTIC CONSULT NOTE - Follow Up  Pharmacy Consult for Vancomycin Indication: HAP  Labs:  Recent Labs  10/31/12 1807 11/01/12 0534 11/02/12 0520  WBC 12.0* 14.9*  --   HGB 10.9* 11.0*  --   PLT 686* 639*  --   CREATININE 0.67 0.67 0.83   Estimated Creatinine Clearance: 89.7 ml/min (by C-G formula based on Cr of 0.83).  Recent Labs  11/02/12 1744  VANCOTROUGH 25.2*   Assessment: Vanc trough above target range on 1g IV q8h. Confirmed timing of the sample was correct. The 18:00 dose was infusing when the result was reported and it was stopped after about half had infused.  Goal of Therapy:  Vancomycin trough level 15-20 mcg/ml  Plan:   Change Vancomycin to 1g IV q12h, starting 12hrs from now.  F/u daily.  Charolotte Eke, PharmD, pager [phone]. 11/02/2012,6:37 PM.

## 2012-11-02 NOTE — Progress Notes (Signed)
ANTIBIOTIC CONSULT NOTE - Follow Up  Pharmacy Consult for Vancomycin and Zosyn Indication: HAP  Allergies  Allergen Reactions  . Mucinex [Guaifenesin Er] Shortness Of Breath    Patient Measurements: Height: 4\' 11"  (149.9 cm) Weight: 162 lb 0.6 oz (73.5 kg) IBW/kg (Calculated) : 43.2   Vital Signs: Temp: 98.1 F (36.7 C) (08/24 0434) Temp src: Oral (08/24 0434) BP: 111/75 mmHg (08/24 0434) Pulse Rate: 92 (08/24 0434) Intake/Output from previous day: 08/23 0701 - 08/24 0700 In: 1394.7 [P.O.:360; I.V.:284.7; IV Piggyback:750] Out: 800 [Urine:800] Intake/Output from this shift:    Labs:  Recent Labs  10/31/12 1807 11/01/12 0534 11/02/12 0520  WBC 12.0* 14.9*  --   HGB 10.9* 11.0*  --   PLT 686* 639*  --   CREATININE 0.67 0.67 0.83   Estimated Creatinine Clearance: 89.7 ml/min (by C-G formula based on Cr of 0.83). No results found for this basename: VANCOTROUGH, Leodis Binet, VANCORANDOM, GENTTROUGH, GENTPEAK, GENTRANDOM, TOBRATROUGH, TOBRAPEAK, TOBRARND, AMIKACINPEAK, AMIKACINTROU, AMIKACIN,  in the last 72 hours   Microbiology: Recent Results (from the past 720 hour(s))  URINE CULTURE     Status: None   Collection Time    10/05/12 11:40 PM      Result Value Range Status   Specimen Description URINE, CATHETERIZED   Final   Special Requests NONE   Final   Culture  Setup Time 10/06/2012 14:01   Final   Colony Count >=100,000 COLONIES/ML   Final   Culture ESCHERICHIA COLI   Final   Report Status 10/07/2012 FINAL   Final   Organism ID, Bacteria ESCHERICHIA COLI   Final  MRSA PCR SCREENING     Status: Abnormal   Collection Time    10/07/12  2:50 PM      Result Value Range Status   MRSA by PCR INVALID RESULTS, SPECIMEN SENT FOR CULTURE (*) NEGATIVE Final   Comment:            The GeneXpert MRSA Assay (FDA     approved for NASAL specimens     only), is one component of a     comprehensive MRSA colonization     surveillance program. It is not     intended to  diagnose MRSA     infection nor to guide or     monitor treatment for     MRSA infections.     RCRV     SACHS,K AT 1650 ON 10/07/12 BY HAGGINSC     RESULT CALLED TO, READ BACK BY AND VERIFIED WITH:  MRSA CULTURE     Status: None   Collection Time    10/07/12  2:50 PM      Result Value Range Status   Specimen Description NASOPHARYNGEAL   Final   Special Requests NONE   Final   Culture     Final   Value: NO STAPHYLOCOCCUS AUREUS ISOLATED     Note: NOMRSA   Report Status 10/10/2012 FINAL   Final  URINE CULTURE     Status: None   Collection Time    10/16/12  3:25 PM      Result Value Range Status   Specimen Description URINE, CLEAN CATCH   Final   Special Requests NONE   Final   Culture  Setup Time     Final   Value: 10/17/2012 02:05     Performed at [facility] Count     Final   Value: 70,000 COLONIES/ML  Performed at [facility]     Final   Value: Multiple bacterial morphotypes present, none predominant. Suggest appropriate recollection if clinically indicated.     Performed at [facility]   Report Status 10/17/2012 FINAL   Final    Medical History: Past Medical History  Diagnosis Date  . Herpes genitalia   . HSV (herpes simplex virus) infection     last outbreak in may  . Mitral valve prolapse   . Status post repair of cor triatriatum     Echocardiogram 12/05: EF 55-65%, no mitral valve prolapse, mild RVE and no mention of any residual ASD.    Marland Kitchen Abnormal Pap smear   . Gestational diabetes   . Infection     UTI  . Anxiety     on meds  . Healthcare-associated pneumonia 10/31/2012    Medications:  Scheduled:  . furosemide  20 mg Intravenous Q12H  . lactobacillus acidophilus  2 tablet Oral TID  . piperacillin-tazobactam (ZOSYN)  IV  3.375 g Intravenous Q8H  . potassium chloride  40 mEq Oral BID  . sodium chloride  3 mL Intravenous Q12H  . vancomycin  1,000 mg Intravenous Q8H   Infusions:  . dextrose 5 % and 0.45%  NaCl 20 mL/hr at 11/02/12 4098   Assessment: 27 yo s/p recent C-section.  Pt required multiple blood transfusions.  Presented to ER with HAP treating with abx's. Pt is breastfeeding.  D2 Vancomycin/Zosyn  Afebrile  Stable renal function  Goal of Therapy:  Vancomycin trough level 15-20 mcg/ml  Plan:  1) Continue Vancomycin 1g q8 for now 2) Check vanc trough prior to 5th dose tonight 3) Continue current Zosyn dosing   Hessie Knows, PharmD, BCPS Pager [phone] 11/02/2012 9:17 AM

## 2012-11-02 NOTE — Progress Notes (Signed)
  Echocardiogram 2D Echocardiogram has been performed.  Arvil Chaco 11/02/2012, 10:16 AM

## 2012-11-02 NOTE — Progress Notes (Signed)
Subjective: Less abdominal pain  Objective: Vital signs in last 24 hours: Temp:  [98.1 F (36.7 C)-98.9 F (37.2 C)] 98.1 F (36.7 C) (08/24 0434) Pulse Rate:  [92-97] 92 (08/24 0434) Resp:  [18] 18 (08/24 0434) BP: (111-125)/(75-91) 111/75 mmHg (08/24 0434) SpO2:  [94 %-99 %] 94 % (08/24 0434) Weight:  [162 lb 0.6 oz (73.5 kg)] 162 lb 0.6 oz (73.5 kg) (08/24 0500)    Intake/Output from previous day: 08/23 0701 - 08/24 0700 In: 1394.7 [P.O.:360; I.V.:284.7; IV Piggyback:750] Out: 800 [Urine:800] Intake/Output this shift:    GI: mild RUQ tenderness. less than yesterday  Lab Results:   Recent Labs  10/31/12 1807 11/01/12 0534  WBC 12.0* 14.9*  HGB 10.9* 11.0*  HCT 34.8* 35.2*  PLT 686* 639*   BMET  Recent Labs  11/01/12 0534 11/02/12 0520  NA 137 136  K 3.5 3.3*  CL 102 101  CO2 22 25  GLUCOSE 115* 101*  BUN 26* 23  CREATININE 0.67 0.83  CALCIUM 8.0* 7.9*   PT/INR  Recent Labs  11/01/12 0058  LABPROT 16.4*  INR 1.36   ABG No results found for this basename: PHART, PCO2, PO2, HCO3,  in the last 72 hours  Studies/Results: Dg Chest 2 View  10/31/2012   *RADIOLOGY REPORT*  Clinical Data: 3 weeks postpartum.  Shortness of breath with productive cough.  CHEST - 2 VIEW  Comparison: 10/08/2012  Findings: Lungs are adequately inflated demonstrate moderate perihilar bibasilar opacification right worse than left which may be due to edema versus multi focal pneumonia. No definite effusions seen.  Moderate stable cardiomegaly.  Surgical clips over the anterior/superior mediastinum unchanged.  Remainder of the exam is unchanged.  IMPRESSION: Worsening bilateral perihilar bibasilar opacification right worse than left suggesting multifocal pneumonia versus edema.  Stable cardiomegaly.   Original Report Authenticated By: Elberta Fortis, M.D.   US Abdomen Complete  10/31/2012   *RADIOLOGY REPORT*  Clinical Data:  Right upper quadrant pain.  Elevated liver function  studies.  COMPLETE ABDOMINAL ULTRASOUND  Comparison:  10/07/2012  Findings:  Gallbladder:  Diffuse gallbladder wall thickening and edema.  Wall thickness measures from between 4-7 mm.  No gallstones or sludge are visualized but there is a positive Murphy's sign.  Findings may represent inflammatory process or acalculus cholecystitis.  Common bile duct:  Normal caliber with measured diameter of 2 mm.  Liver:  Small amount of fluid is noted around the liver.  Compared with the previous CT scan, the amount of ascites appears to be decreased.  No focal liver lesions.  IVC:  Appears normal.  Pancreas:  No focal abnormality seen.  Spleen:  Spleen length measures 8.7 cm.  Normal parenchymal echotexture.  Right Kidney:  Right kidney measures 11.6 cm length.  No hydronephrosis.  Left Kidney:  Left kidney measures 12.8 cm length.  No hydronephrosis.  Abdominal aorta:  No aneurysm identified.  IMPRESSION: Gallbladder wall is thickened and edematous in the Murphy's sign is positive.  No stones or sludge are visualized.  Changes could represent inflammatory process, edema, or acalculus cholecystitis. Small amount of fluid around the liver edge appears to be decreased since previous CT.   Original Report Authenticated By: Burman Nieves, M.D.   Dg Chest Port 1 View  11/01/2012   *RADIOLOGY REPORT*  Clinical Data: Short of breath.  Infiltrates.  PORTABLE CHEST - 1 VIEW  Comparison: 10/31/2012  Findings: Patchy bilateral central lung infiltrates are stable.  Cardiac silhouette is mild to moderately enlarged.  No  mediastinal or hilar masses are noted.  There is no pneumothorax or convincing pleural effusion.  IMPRESSION: Persistent bilateral central lung airspace opacities.  This is consistent with multifocal pneumonia but could reflect pulmonary edema.   Original Report Authenticated By: Amie Portland, M.D.    Anti-infectives: Anti-infectives   Start     Dose/Rate Route Frequency Ordered Stop   11/01/12 1000   piperacillin-tazobactam (ZOSYN) IVPB 3.375 g     3.375 g 12.5 mL/hr over 240 Minutes Intravenous Every 8 hours 11/01/12 0244     11/01/12 1000  vancomycin (VANCOCIN) IVPB 1000 mg/200 mL premix     1,000 mg 200 mL/hr over 60 Minutes Intravenous Every 8 hours 11/01/12 0244     11/01/12 0030  piperacillin-tazobactam (ZOSYN) IVPB 3.375 g     3.375 g 100 mL/hr over 30 Minutes Intravenous  Once 11/01/12 0006 11/01/12 0142   11/01/12 0030  vancomycin (VANCOCIN) IVPB 1000 mg/200 mL premix     1,000 mg 200 mL/hr over 60 Minutes Intravenous  Once 11/01/12 0006 11/01/12 0305   10/31/12 2330  cefTRIAXone (ROCEPHIN) 1 g in dextrose 5 % 50 mL IVPB  Status:  Discontinued     1 g 100 mL/hr over 30 Minutes Intravenous  Once 10/31/12 2328 10/31/12 2358   10/31/12 2330  azithromycin (ZITHROMAX) 500 mg in dextrose 5 % 250 mL IVPB  Status:  Discontinued     500 mg 250 mL/hr over 60 Minutes Intravenous  Once 10/31/12 2328 10/31/12 2358      Assessment/Plan:  LOS: 2 days  Elevated LFT  Workup pending Abdominal pain RUQ no stones but may need HIDA Pneumonia per medicine\ She is having less abdominal pain.  Continue to treat pneumonia and will follow up on Monday.    Breuna Loveall A. 11/02/2012

## 2012-11-03 DIAGNOSIS — I509 Heart failure, unspecified: Secondary | ICD-10-CM

## 2012-11-03 DIAGNOSIS — I5021 Acute systolic (congestive) heart failure: Secondary | ICD-10-CM | POA: Diagnosis present

## 2012-11-03 LAB — BASIC METABOLIC PANEL
CO2: 30 mEq/L (ref 19–32)
Calcium: 7.7 mg/dL — ABNORMAL LOW (ref 8.4–10.5)
Chloride: 101 mEq/L (ref 96–112)
Creatinine, Ser: 1.31 mg/dL — ABNORMAL HIGH (ref 0.50–1.10)
GFR calc Af Amer: 64 mL/min — ABNORMAL LOW (ref >90)
Sodium: 137 mEq/L (ref 135–145)

## 2012-11-03 LAB — HEPATITIS PANEL, ACUTE
Hep A IgM: NEGATIVE
Hep B C IgM: NEGATIVE
Hepatitis B Surface Ag: NEGATIVE

## 2012-11-03 LAB — PRO B NATRIURETIC PEPTIDE: Pro B Natriuretic peptide (BNP): 5653 pg/mL — ABNORMAL HIGH (ref 0–125)

## 2012-11-03 LAB — GLUCOSE, CAPILLARY: Glucose-Capillary: 155 mg/dL — ABNORMAL HIGH (ref 70–99)

## 2012-11-03 MED ORDER — SODIUM CHLORIDE 0.9 % IV BOLUS (SEPSIS)
250.0000 mL | Freq: Once | INTRAVENOUS | Status: AC
  Administered 2012-11-03: 250 mL via INTRAVENOUS

## 2012-11-03 MED ORDER — METOPROLOL TARTRATE 12.5 MG HALF TABLET
12.5000 mg | ORAL_TABLET | Freq: Two times a day (BID) | ORAL | Status: DC
  Administered 2012-11-03 (×2): 12.5 mg via ORAL
  Filled 2012-11-03 (×4): qty 1

## 2012-11-03 MED ORDER — LISINOPRIL 5 MG PO TABS
5.0000 mg | ORAL_TABLET | Freq: Every day | ORAL | Status: DC
  Administered 2012-11-03: 5 mg via ORAL
  Filled 2012-11-03 (×2): qty 1

## 2012-11-03 MED ORDER — FUROSEMIDE 20 MG PO TABS
10.0000 mg | ORAL_TABLET | Freq: Every day | ORAL | Status: DC
  Administered 2012-11-03 – 2012-11-04 (×2): 10 mg via ORAL
  Filled 2012-11-03 (×2): qty 0.5

## 2012-11-03 MED ORDER — LEVOFLOXACIN 500 MG PO TABS
500.0000 mg | ORAL_TABLET | Freq: Every day | ORAL | Status: DC
  Administered 2012-11-03 – 2012-11-04 (×2): 500 mg via ORAL
  Filled 2012-11-03 (×2): qty 1

## 2012-11-03 NOTE — Care Management Note (Addendum)
    Page 1 of 1   11/05/2012     11:21:39 AM   CARE MANAGEMENT NOTE 11/05/2012  Patient:  [name], [name]   Account Number:  [id number]  Date Initiated:  11/03/2012  Documentation initiated by:  Lanier Clam  Subjective/Objective Assessment:   27 y/o f admitted w/sob.pna.readmit-[facility]-10/06/12.     Action/Plan:   From home w/spouse, newborn.   Anticipated DC Date:  11/04/2012   Anticipated DC Plan:  HOME/SELF CARE  In-house referral  PCP / [facility]      DC Planning Services  CM consult      Choice offered to / List presented to:             Status of service:  Completed, signed off Medicare Important Message given?   (If response is "NO", the following Medicare IM given date fields will be blank) Date Medicare IM given:   Date Additional Medicare IM given:    Discharge Disposition:  HOME/SELF CARE  Per UR Regulation:  Reviewed for med. necessity/level of care/duration of stay  If discussed at Long Length of Stay Meetings, dates discussed:    Comments:  11/05/12 [facility] RN,BSN NCM [phone] D/C 11/04/12 NO NEEDS OR ORDERS.  11/03/12 Aiysha Jillson RN,BSN NCM [phone] PROVIDED W/PCP LISTING,ENCOURAGED TO CONTACT MEDICAID  CASE WORKER FOR PCP LISITNG.

## 2012-11-03 NOTE — Consult Note (Signed)
CARDIOLOGY CONSULT NOTE    Patient ID: [name] MRN: [id number] DOB/AGE: 05-07-1985 27 y.o.  Admit date: 10/31/2012 Referring Physician:  Robb Matar Primary Physician: Ron Parker, MD Primary Cardiologist:  Tenny Craw Reason for Consultation:  Abnormal eco ? CHF  Principal Problem:   Healthcare-associated pneumonia Active Problems:   Elevated LFTs   Cholecystitis   Acute systolic congestive heart failure   HPI:   27 yo with congenital heart disease. Had surgery at [facility] 54 months of age.  Followed by Dr Candis Musa at [facility] for a while.  Most recently by Dr Tenny Craw 04/16/11.  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  Some reports indicate MV repair but f/u echos dont support this Echo 04/13/11 showed normal EF and no residual ASD.  Had emergent C section a few weeks ago and post op complications with sutures breaking loose Admitted with pneumonia and elevated LFTs.  Improving now on oral antibiotics.  Echo this hospitalization suggests EF 30-35% with mild to moderate MR and residual ASD.  She has no PND orthopnea edema or chest pain.   Baby girl came home and is doing fine now  27 year old son also doing well.    @ROS @ All other systems reviewed and negative except as noted above  Past Medical History  Diagnosis Date  . Herpes genitalia   . HSV (herpes simplex virus) infection     last outbreak in may  . Mitral valve prolapse   . Status post repair of cor triatriatum     Echocardiogram 12/05: EF 55-65%, no mitral valve prolapse, mild RVE and no mention of any residual ASD.    Marland Kitchen Abnormal Pap smear   . Gestational diabetes   . Infection     UTI  . Anxiety     on meds  . Healthcare-associated pneumonia 10/31/2012    Family History  Problem Relation Age of Onset  . Diabetes Mother   . Asthma Mother   . Diabetes Father   . Asthma Father   . Diabetes Maternal  Grandmother   . Diabetes Paternal Grandfather     History   Social History  . Marital Status: Married    Spouse Name: N/A    Number of Children: N/A  . Years of Education: N/A   Occupational History  . Not on file.   Social History Main Topics  . Smoking status: Former Games developer  . Smokeless tobacco: Never Used     Comment: quit 1st trimester  . Alcohol Use: Yes     Comment: occasional alcohol, not with preg  . Drug Use: No  . Sexual Activity: Yes    Birth Control/ Protection: None   Other Topics Concern  . Not on file   Social History Narrative  . No narrative on file    Past Surgical History  Procedure Laterality Date  . Cardiac surgery      congenital heart defect repair at 9 mos  . Exploration post operative open heart    . Cesarean section N/A 10/06/2012    Procedure: Primary CESAREAN SECTION  of baby girl at 0347 APGAR 8/8;  Surgeon: Philip Aspen, DO;  Location: [facility];  Service: Obstetrics;  Laterality: N/A;  . Laparotomy N/A 10/07/2012    Procedure: EXPLORATORY LAPAROTOMY;  Surgeon: Freddrick March. Tenny Craw, MD;  Location: [facility];  Service: Gynecology;  Laterality: N/A;     .  lactobacillus acidophilus & bulgar  2 tablet Oral TID  . levofloxacin  500 mg Oral Daily  . lisinopril  5 mg Oral Daily  . metoprolol tartrate  12.5 mg Oral BID  . sodium chloride  3 mL Intravenous Q12H      Physical Exam: Blood pressure 102/67, pulse 77, temperature 98.2 F (36.8 C), temperature source Oral, resp. rate 16, height 4\' 11"  (1.499 m), weight 163 lb (73.936 kg), SpO2 100.00%, not currently breastfeeding.   Affect appropriate Healthy:  appears stated age HEENT: strabismus with weak right eye  Neck supple with no adenopathy JVP normal no bruits no thyromegaly Lungs clear with no wheezing and good diaphragmatic motion Heart:  S1/S2 no murmur, no rub, gallop or click PMI normal Abdomen: benighn, BS positve, no tenderness, no AAA no bruit.  No HSM or HJR Distal pulses intact  with no bruits No edema Neuro non-focal Skin warm and dry No muscular weakness   Labs:   Lab Results  Component Value Date   WBC 14.9* 11/01/2012   HGB 11.0* 11/01/2012   HCT 35.2* 11/01/2012   MCV 91.7 11/01/2012   PLT 639* 11/01/2012    Recent Labs Lab 11/01/12 0534  11/03/12 0435  NA 137  < > 137  K 3.5  < > 3.7  CL 102  < > 101  CO2 22  < > 30  BUN 26*  < > 18  CREATININE 0.67  < > 1.31*  CALCIUM 8.0*  < > 7.7*  PROT 5.8*  --   --   BILITOT 1.2  --   --   ALKPHOS 349*  --   --   ALT 603*  --   --   AST 785*  --   --   GLUCOSE 115*  < > 96  < > = values in this interval not displayed. Lab Results  Component Value Date   CKTOTAL 139 11/01/2012   CKMB 1.2 11/27/2010   TROPONINI <0.30 11/01/2012     Radiology: Ct Abdomen Pelvis Wo Contrast  10/07/2012   *RADIOLOGY REPORT*  Clinical Data: Status post C-section on October 06, 2012.  Hemoglobin dropped to 6.4.  Abdomen pain.  Evaluate for hematoma.  CT ABDOMEN AND PELVIS WITHOUT CONTRAST  Technique:  Multidetector CT imaging of the abdomen and pelvis was performed following the standard protocol without intravenous contrast.  Comparison: None.  Findings: The liver, spleen, pancreas, adrenal glands and right kidney are normal.  There is a 4 mm nonobstructing stone within the left kidney.  There is no hydronephrosis bilaterally.  There is probable sludge in gallbladder.  There is extensive fluid and hemorrhage throughout the abdomen and pelvis. There is a large hematoma starting from inferior to the uterus extending to the surround the uterus and extending to the mid to superior aspect of the uterus measuring at least 18 cm in diameter and 10 cm in anterior-posterior dimension.  The uterus is enlarged with an area of hemorrhage is identified within the endometrial cavity extending into the cervix. There are postsurgical changes from the recent C-section with skin staples anterior pelvic subcutaneous air and fluid and a small amount of  free air in the pelvis.  The bladder is decompressed with a Foley catheter in place.  There is atelectasis of the visualized lung bases.  IMPRESSION: Extensive fluid and hemorrhage throughout the abdomen and pelvis with a large hematoma surrounding the uterus.  There is blood and air identified within the endometrial cavity extending to the  cervical canal. Postoperative changes from a recent C-section.  The results were called to Dr. Tenny Craw today at 9:55 a.m.   Original Report Authenticated By: Sherian Rein, M.D.   Dg Chest 2 View  10/31/2012   *RADIOLOGY REPORT*  Clinical Data: 3 weeks postpartum.  Shortness of breath with productive cough.  CHEST - 2 VIEW  Comparison: 10/08/2012  Findings: Lungs are adequately inflated demonstrate moderate perihilar bibasilar opacification right worse than left which may be due to edema versus multi focal pneumonia. No definite effusions seen.  Moderate stable cardiomegaly.  Surgical clips over the anterior/superior mediastinum unchanged.  Remainder of the exam is unchanged.  IMPRESSION: Worsening bilateral perihilar bibasilar opacification right worse than left suggesting multifocal pneumonia versus edema.  Stable cardiomegaly.   Original Report Authenticated By: Elberta Fortis, M.D.   Dg Abd 1 View  10/16/2012   *RADIOLOGY REPORT*  Clinical Data: Abdominal pain.  Status post C-section and 10/06/2012.  ABDOMEN - 1 VIEW  Comparison: CT of the abdomen and 10/07/2012.  Findings: Gas and stool are noted throughout the colon, most pronounced in the region of the transverse colon.  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  No definite pathologic distension of small bowel.  No gross evidence of pneumoperitoneum.  IMPRESSION: 1.  Nonobstructive bowel  gas pattern, as above. 2.  No pneumoperitoneum. 3.  Paucity of bowel gas throughout the lower abdomen and pelvis may simply relate to the patient's post gravid uterus, however, the possibility of recurrent hemorrhage in this region is not excluded. If there is clinical concern for recurrent hemorrhage which could cause mass effect accounting for these findings, a repeat CT scan with IV contrast would be recommended at this time.   Original Report Authenticated By: Trudie Reed, M.D.   US Abdomen Complete  10/31/2012   *RADIOLOGY REPORT*  Clinical Data:  Right upper quadrant pain.  Elevated liver function studies.  COMPLETE ABDOMINAL ULTRASOUND  Comparison:  10/07/2012  Findings:  Gallbladder:  Diffuse gallbladder wall thickening and edema.  Wall thickness measures from between 4-7 mm.  No gallstones or sludge are visualized but there is a positive Murphy's sign.  Findings may represent inflammatory process or acalculus cholecystitis.  Common bile duct:  Normal caliber with measured diameter of 2 mm.  Liver:  Small amount of fluid is noted around the liver.  Compared with the previous CT scan, the amount of ascites appears to be decreased.  No focal liver lesions.  IVC:  Appears normal.  Pancreas:  No focal abnormality seen.  Spleen:  Spleen length measures 8.7 cm.  Normal parenchymal echotexture.  Right Kidney:  Right kidney measures 11.6 cm length.  No hydronephrosis.  Left Kidney:  Left kidney measures 12.8 cm length.  No hydronephrosis.  Abdominal aorta:  No aneurysm identified.  IMPRESSION: Gallbladder wall is thickened and edematous in the Murphy's sign is positive.  No stones or sludge are visualized.  Changes could represent inflammatory process, edema, or acalculus cholecystitis. Small amount of fluid around the liver edge appears to be decreased since previous CT.   Original Report Authenticated By: Burman Nieves, M.D.   US Ob Limited  10/06/2012   *RADIOLOGY REPORT*  Clinical Data: Evaluate  presentation  LIMITED OBSTETRIC ULTRASOUND  Number of Fetuses: 1 Heart Rate: 139  bpm Movement: Present  Presentation: Breech  Examination was only performed for presentation hand done portably. The amniotic fluid was not evaluated.  IMPRESSION: Single breech presentation with fetal heart tones of 139 beats per minute.  Recommend followup with non-emergent complete OB 14+ wk US examination for fetal biometric evaluation and anatomic survey if not already performed.   Original Report Authenticated By: Andreas Newport, M.D.   US Ob Limited  10/05/2012   *RADIOLOGY REPORT*  Clinical Data: Possible premature rupture of membranes, nonreactive NST  LIMITED OBSTETRIC ULTRASOUND  Number of Fetuses: 1 Heart Rate: 155 bpm Movement: Present Presentation: Breech Placental Location: Fundal Previa: None Amniotic Fluid (Subjective): Normal  Vertical Pocket 6.4 cm     AFI 19.6 cm  (5%ile = 7.7 cm; 95%ile = 24.9 cm)  BPP:  8/8  BPD:  7.57 cm     30 w  3 d         EDC:  12/11/2012  MATERNAL FINDINGS: Cervix: Not evaluated Uterus/Adnexae:  The right ovary remains enlarged measuring approximately 9.8 x 3.9 x 9.0 cm (previously, 10.1 x 7.2 x 9.8 cm) and contains multiple peripheral anechoic cysts.  The left ovary is not visualized on the present examination.  IMPRESSION: 1.  Uncomplicated viable single intrauterine pregnancy.  BPP - 8/8  2.  Persistent enlargement of the right ovary which again raises the possibility of polycystic ovarian disease.  The left ovary was not visualized on the present examination.  Recommend followup with non-emergent complete OB 14+ wk US examination for fetal biometric evaluation and anatomic survey if not already performed.   Original Report Authenticated By: Tacey Ruiz, MD   US Fetal Bpp W/o Non Stress  10/05/2012   *RADIOLOGY REPORT*  Clinical Data: Possible premature rupture of membranes, nonreactive NST  LIMITED OBSTETRIC ULTRASOUND  Number of Fetuses: 1 Heart Rate: 155 bpm Movement: Present  Presentation: Breech Placental Location: Fundal Previa: None Amniotic Fluid (Subjective): Normal  Vertical Pocket 6.4 cm     AFI 19.6 cm  (5%ile = 7.7 cm; 95%ile = 24.9 cm)  BPP:  8/8  BPD:  7.57 cm     30 w  3 d         EDC:  12/11/2012  MATERNAL FINDINGS: Cervix: Not evaluated Uterus/Adnexae:  The right ovary remains enlarged measuring approximately 9.8 x 3.9 x 9.0 cm (previously, 10.1 x 7.2 x 9.8 cm) and contains multiple peripheral anechoic cysts.  The left ovary is not visualized on the present examination.  IMPRESSION: 1.  Uncomplicated viable single intrauterine pregnancy.  BPP - 8/8  2.  Persistent enlargement of the right ovary which again raises the possibility of polycystic ovarian disease.  The left ovary was not visualized on the present examination.  Recommend followup with non-emergent complete OB 14+ wk US examination for fetal biometric evaluation and anatomic survey if not already performed.   Original Report Authenticated By: Tacey Ruiz, MD   Dg Chest Port 1 View  11/01/2012   *RADIOLOGY REPORT*  Clinical Data: Short of breath.  Infiltrates.  PORTABLE CHEST - 1 VIEW  Comparison: 10/31/2012  Findings: Patchy bilateral central lung infiltrates are stable.  Cardiac silhouette is mild to moderately enlarged.  No mediastinal or hilar masses are noted.  There is no pneumothorax or convincing pleural effusion.  IMPRESSION: Persistent bilateral central lung airspace opacities.  This is consistent with multifocal pneumonia but could reflect pulmonary edema.   Original Report Authenticated By: Amie Portland, M.D.   Dg Chest  Port 1 View  10/08/2012   *RADIOLOGY REPORT*  Clinical Data: Confirm line placement  PORTABLE CHEST - 1 VIEW  Comparison: 03/14/1998 third  Findings: Right upper extremity PICC projects over the mid superior vena cava, at approximately the level of the carina.  Surgical clips project over the upper chest.  Enlargement cardiopericardial silhouette is noted.  There is pulmonary  vascular congestion.  No focal airspace opacities or pleural effusion.  There is slight respiratory motion.  Negative for pneumothorax.  Nasogastric tube courses into the stomach and continues below the edge of the image.  IMPRESSION:  1.  Right upper extremity PICC projects over the mid superior vena cava. 2.  Cardiac enlargement.  With pulmonary vascular congestion   Original Report Authenticated By: Britta Mccreedy, M.D.    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  Should be on daily lasix for a while Titrate ACE as tolerated and continue beta blocker. LFT elevation not likely from CHF as EF is not that bad and IVC was normal on [place] 10/31/12  Suspect elevation from recent operations or associated with lung infection.  No need for expansive cardis w/u while in hospital.  Will arrange outpatient f/u with Dr Tenny Craw.  At some point after on meds and recovered would start with cardiac MRI to accurately assess EF and possible residual ASD with Qp/Qs Pneumoni:  Continue oral levaquin.   Post-partum:  F/U ob/gyn incisions healing well.  Patient is not breast feeding so Rx not an issue  Signed: Charlton Haws 11/03/2012, 3:00 PM

## 2012-11-03 NOTE — Progress Notes (Signed)
Patient has been having watery bowel movements when using the bathroom, so we are unable to accurately measure her urine output.  Ernesta Amble, RN

## 2012-11-03 NOTE — Progress Notes (Signed)
  Subjective: Less abdominal pain and no RUQ pain, tolerating a diet  Objective: Vital signs in last 24 hours: Temp:  [98.1 F (36.7 C)-98.3 F (36.8 C)] 98.1 F (36.7 C) (08/25 0454) Pulse Rate:  [80-89] 80 (08/25 0454) Resp:  [18-20] 18 (08/25 0454) BP: (105-119)/(59-76) 108/76 mmHg (08/25 0454) SpO2:  [97 %-99 %] 97 % (08/25 0454) Weight:  [163 lb (73.936 kg)] 163 lb (73.936 kg) (08/25 0454) Last BM Date: 11/02/12  Intake/Output from previous day: 08/24 0701 - 08/25 0700 In: 1491.7 [P.O.:720; I.V.:321.7; IV Piggyback:450] Out: 800 [Urine:800] Intake/Output this shift:    GI: no RUQ tenderness. l  Lab Results:   Recent Labs  10/31/12 1807 11/01/12 0534  WBC 12.0* 14.9*  HGB 10.9* 11.0*  HCT 34.8* 35.2*  PLT 686* 639*   BMET  Recent Labs  11/02/12 0520 11/03/12 0435  NA 136 137  K 3.3* 3.7  CL 101 101  CO2 25 30  GLUCOSE 101* 96  BUN 23 18  CREATININE 0.83 1.31*  CALCIUM 7.9* 7.7*   PT/INR  Recent Labs  11/01/12 0058  LABPROT 16.4*  INR 1.36   ABG No results found for this basename: PHART, PCO2, PO2, HCO3,  in the last 72 hours  Studies/Results: No results found.  Anti-infectives: Anti-infectives   Start     Dose/Rate Route Frequency Ordered Stop   11/03/12 1000  levofloxacin (LEVAQUIN) tablet 500 mg     500 mg Oral Daily 11/03/12 0918     11/03/12 0800  vancomycin (VANCOCIN) IVPB 1000 mg/200 mL premix  Status:  Discontinued     1,000 mg 200 mL/hr over 60 Minutes Intravenous Every 12 hours 11/02/12 1838 11/03/12 0918   11/01/12 1000  piperacillin-tazobactam (ZOSYN) IVPB 3.375 g  Status:  Discontinued     3.375 g 12.5 mL/hr over 240 Minutes Intravenous Every 8 hours 11/01/12 0244 11/03/12 0918   11/01/12 1000  vancomycin (VANCOCIN) IVPB 1000 mg/200 mL premix  Status:  Discontinued     1,000 mg 200 mL/hr over 60 Minutes Intravenous Every 8 hours 11/01/12 0244 11/02/12 1838   11/01/12 0030  piperacillin-tazobactam (ZOSYN) IVPB 3.375 g      3.375 g 100 mL/hr over 30 Minutes Intravenous  Once 11/01/12 0006 11/01/12 0142   11/01/12 0030  vancomycin (VANCOCIN) IVPB 1000 mg/200 mL premix     1,000 mg 200 mL/hr over 60 Minutes Intravenous  Once 11/01/12 0006 11/01/12 0305   10/31/12 2330  cefTRIAXone (ROCEPHIN) 1 g in dextrose 5 % 50 mL IVPB  Status:  Discontinued     1 g 100 mL/hr over 30 Minutes Intravenous  Once 10/31/12 2328 10/31/12 2358   10/31/12 2330  azithromycin (ZITHROMAX) 500 mg in dextrose 5 % 250 mL IVPB  Status:  Discontinued     500 mg 250 mL/hr over 60 Minutes Intravenous  Once 10/31/12 2328 10/31/12 2358      Assessment/Plan:  LOS: 3 days  Elevated LFT, Hep panel neg, RUQ US shows no stones and non-dilated CBD.  Given pain getting better and on reg diet, it is unlikely this would be acalculous cholecystitis.  I do not think that any further testing is necessary unless her pain or LFT's worsen.  She can f/u with Dr Luisa Hart as an outpatient.  Will sign off.  Please call if any questions or concerns or her symptoms change.     [name] C. 11/03/2012

## 2012-11-03 NOTE — Progress Notes (Signed)
[facility] PROGRESS NOTE  Assessment/Plan: *Healthcare-associated pneumonia: - Vanc and zosyn started on 8.23.2014, afebrile with mild leukocytosis changed to oral levaquin on 8.25.2014. - Cultures pending.  Acute systolic decompensated Heart failure: - Started on lasix on admission, creatinine increase will hold lasix, small bolus NS. - Will start low dose Beta blocker and ACE-I. - Check BNP,  Echo: showed an EF 30%, Right atrium was mildly dilated. - Pulmonary arteries: was high. - Cont. strict I and O's. - Resume lasix in am orally.  Elevated LFTs - Mild elevation. Most likely due to HF - hepatitis panel negative. - Surgery does no think is due to her gallbladder.   Code Status: full Family Communication: none  Disposition Plan: inaptient   Consultants:  Surgery  cardiology  Procedures:  [place]  CXR  Antibiotics:  vanc and zosyn 8.23.2014  HPI/Subjective: No complains  Objective: Filed Vitals:   11/02/12 0500 11/02/12 1300 11/02/12 2005 11/03/12 0454  BP:  119/73 105/59 108/76  Pulse:  82 89 80  Temp:  98.3 F (36.8 C) 98.1 F (36.7 C) 98.1 F (36.7 C)  TempSrc:  Oral Oral Oral  Resp:  20 18 18   Height:      Weight: 73.5 kg (162 lb 0.6 oz)   73.936 kg (163 lb)  SpO2:  99% 98% 97%    Intake/Output Summary (Last 24 hours) at 11/03/12 0918 Last data filed at 11/03/12 0626  Gross per 24 hour  Intake 1251.66 ml  Output    800 ml  Net 451.66 ml   Filed Weights   11/01/12 0145 11/02/12 0500 11/03/12 0454  Weight: 73.8 kg (162 lb 11.2 oz) 73.5 kg (162 lb 0.6 oz) 73.936 kg (163 lb)    Exam:  General: Alert, awake, oriented x3, in no acute distress.  HEENT: No bruits, no goiter. +JVD Heart: Regular rate and rhythm, without murmurs, rubs, gallops.  Lungs: Good air movement, clear to auscultation. Abdomen: Soft, nontender, nondistended, positive bowel sounds.    Data Reviewed: Basic Metabolic Panel:  Recent Labs Lab 10/31/12 1807  11/01/12 0534 11/02/12 0520 11/03/12 0435  NA 136 137 136 137  K 3.6 3.5 3.3* 3.7  CL 100 102 101 101  CO2 22 22 25 30   GLUCOSE 103* 115* 101* 96  BUN 22 26* 23 18  CREATININE 0.67 0.67 0.83 1.31*  CALCIUM 8.4 8.0* 7.9* 7.7*   Liver Function Tests:  Recent Labs Lab 10/31/12 1807 11/01/12 0534  AST 982* 785*  ALT 587* 603*  ALKPHOS 387* 349*  BILITOT 1.0 1.2  PROT 6.1 5.8*  ALBUMIN 2.5* 2.4*    Recent Labs Lab 11/01/12 0058  LIPASE 50   No results found for this basename: AMMONIA,  in the last 168 hours CBC:  Recent Labs Lab 10/31/12 1807 11/01/12 0534  WBC 12.0* 14.9*  NEUTROABS 8.1* 11.3*  HGB 10.9* 11.0*  HCT 34.8* 35.2*  MCV 91.8 91.7  PLT 686* 639*   Cardiac Enzymes:  Recent Labs Lab 11/01/12 0015 11/01/12 0810 11/01/12 1407  CKTOTAL 139  --   --   TROPONINI <0.30 <0.30 <0.30   BNP (last 3 results)  Recent Labs  11/01/12 0015  PROBNP 12102.0*   CBG:  Recent Labs Lab 11/02/12 1624 11/02/12 2001 11/02/12 2346 11/03/12 0400 11/03/12 0722  GLUCAP 87 114* 99 96 84    Recent Results (from the past 240 hour(s))  CULTURE, BLOOD (ROUTINE X 2)     Status: None   Collection Time  11/01/12 12:01 AM      Result Value Range Status   Specimen Description BLOOD RIGHT ARM   Final   Special Requests BOTTLES DRAWN AEROBIC AND ANAEROBIC    Final   Culture  Setup Time     Final   Value: 11/01/2012 05:34     Performed at [facility]   Culture     Final   Value:        BLOOD CULTURE RECEIVED NO GROWTH TO DATE CULTURE WILL BE HELD FOR 5 DAYS BEFORE ISSUING A FINAL NEGATIVE REPORT     Performed at [facility]   Report Status PENDING   Incomplete  CULTURE, BLOOD (ROUTINE X 2)     Status: None   Collection Time    11/01/12 12:40 AM      Result Value Range Status   Specimen Description BLOOD LEFT HAND   Final   Special Requests BOTTLES DRAWN AEROBIC ONLY   Final   Culture  Setup Time     Final   Value: 11/01/2012  05:34     Performed at [facility]   Culture     Final   Value:        BLOOD CULTURE RECEIVED NO GROWTH TO DATE CULTURE WILL BE HELD FOR 5 DAYS BEFORE ISSUING A FINAL NEGATIVE REPORT     Performed at [facility]   Report Status PENDING   Incomplete     Studies: No results found.  Scheduled Meds: . lactobacillus acidophilus & bulgar  2 tablet Oral TID  . piperacillin-tazobactam (ZOSYN)  IV  3.375 g Intravenous Q8H  . sodium chloride  250 mL Intravenous Once  . sodium chloride  3 mL Intravenous Q12H  . vancomycin  1,000 mg Intravenous Q12H   Continuous Infusions:     Marinda Elk  [facility] Pager [phone]. If 8PM-8AM, please contact night-coverage at [email or web address], password [email or web address] 11/03/2012, 9:18 AM  LOS: 3 days

## 2012-11-03 NOTE — Evaluation (Signed)
Physical Therapy Evaluation Patient Details Name: [name] MRN: [id number] DOB: April 20, 1985 Today's Date: 11/03/2012 Time: 4098-1191 PT Time Calculation (min): 11 min  PT Assessment / Plan / Recommendation History of Present Illness  [name] is a 27 y.o. female admitted 10/31/12 who had recently had cesarean section last month and at that time patient had required at least 8 units of blood transfusion due to bleed presents to the ER at [facility] health because of shortness of breath. In the ER patient also had labs done which showed elevated LFTs and was transferred to [facility].  Patient chest x-ray was showing bilateral infiltrates concerning for pneumonia versus edema. Patient has been admitted for further management. Patient states that over the last one week patient has been having some nausea vomiting and abdominal discomfort. Patient's abdominal discomfort is mostly right upper quadrant and epigastric area. Patient last 2 days has been having increasing shortness of breath. Denies any fevers but had some subjective feeling of chills. Patient's shortness of breath is present even at rest and increase in exertion and has pleuritic type of chest pain. At this time patient has been admitted for further management.  Clinical Impression  Pt is independent in functional mobility. Encouraged pt to pace self with activities and use pursed lip breaths if she feels SOB. No further PT needs at this time.    PT Assessment  Patent does not need any further PT services    Follow Up Recommendations  No PT follow up    Does the patient have the potential to tolerate intense rehabilitation      Barriers to Discharge        Equipment Recommendations  None recommended by PT    Recommendations for Other Services   none  Frequency      Precautions / Restrictions     Pertinent Vitals/Pain 96% sats 86 HR after ambulation      Mobility  Bed Mobility Bed Mobility: Supine  to Sit Supine to Sit: 7: Independent Transfers Transfers: Sit to Stand;Stand to Sit Sit to Stand: 7: Independent Stand to Sit: 7: Independent Ambulation/Gait Ambulation/Gait Assistance: 7: Independent Ambulation Distance (Feet): 400 Feet Ambulation/Gait Assistance Details: IV pole but no balance issues noted. Gait Pattern: Step-through pattern Gait velocity: WFL    Exercises     PT Diagnosis:    PT Problem List:   PT Treatment Interventions:       PT Goals(Current goals can be found in the care plan section)    Visit Information  Last PT Received On: 11/03/12 Assistance Needed: +1 History of Present Illness: [name] is a 27 y.o. female who had recently had cesarean section last month and at that time patient had required at least 8 units of blood transfusion due to bleed presents to the ER at [facility] because of shortness of breath. In the ER patient also had labs done which showed elevated LFTs and was transferred to [facility]. Sonogram shows features concerning for acute acalculus cholecystitis. On-call surgeon was consulted. Patient chest x-ray was showing bilateral infiltrates concerning for pneumonia versus edema. Patient has been admitted for further management. Patient states that over the last one week patient has been having some nausea vomiting and abdominal discomfort. Patient's abdominal discomfort is mostly right upper quadrant and epigastric area. Patient last 2 days has been having increasing shortness of breath. Denies any fevers but had some subjective feeling of chills. Patient's shortness of breath is present even at rest and increase  in exertion and has pleuritic type of chest pain. At this time patient has been admitted for further management.       Prior Functioning  Home Living Family/patient expects to be discharged to:: Private residence Living Arrangements: Spouse/significant other Available Help at Discharge: Family Type of  Home: House Home Access: Stairs to enter Secretary/administrator of Steps: 5 Home Layout: One level Home Equipment: None Prior Function Level of Independence: Independent Communication Communication: No difficulties    Cognition  Cognition Arousal/Alertness: Awake/alert Overall Cognitive Status: Within Functional Limits for tasks assessed Memory: Decreased recall of precautions    Extremity/Trunk Assessment Upper Extremity Assessment Upper Extremity Assessment: Overall WFL for tasks assessed Lower Extremity Assessment Lower Extremity Assessment: Overall WFL for tasks assessed Cervical / Trunk Assessment Cervical / Trunk Assessment: Normal   Balance    End of Session PT - End of Session Activity Tolerance: Patient tolerated treatment well Patient left: in chair;with call bell/phone within reach Nurse Communication: Mobility status  GP     [place], [place] 11/03/2012, 1:59 PM

## 2012-11-04 DIAGNOSIS — O903 Peripartum cardiomyopathy: Secondary | ICD-10-CM | POA: Diagnosis present

## 2012-11-04 DIAGNOSIS — Q242 Cor triatriatum: Secondary | ICD-10-CM

## 2012-11-04 LAB — BASIC METABOLIC PANEL
BUN: 11 mg/dL (ref 6–23)
Calcium: 7.9 mg/dL — ABNORMAL LOW (ref 8.4–10.5)
Creatinine, Ser: 0.9 mg/dL (ref 0.50–1.10)
GFR calc Af Amer: >90 mL/min (ref >90)
GFR calc non Af Amer: 87 mL/min — ABNORMAL LOW (ref >90)

## 2012-11-04 LAB — GLUCOSE, CAPILLARY
Glucose-Capillary: 88 mg/dL (ref 70–99)
Glucose-Capillary: 87 mg/dL (ref 70–99)
Glucose-Capillary: 105 mg/dL — ABNORMAL HIGH (ref 70–99)

## 2012-11-04 MED ORDER — METOPROLOL TARTRATE 25 MG PO TABS
25.0000 mg | ORAL_TABLET | Freq: Two times a day (BID) | ORAL | Status: DC
  Administered 2012-11-04: 25 mg via ORAL
  Filled 2012-11-04 (×2): qty 1

## 2012-11-04 MED ORDER — FUROSEMIDE 20 MG PO TABS: 10.0000 mg | ORAL_TABLET | Freq: Every day | ORAL | Status: DC

## 2012-11-04 MED ORDER — LEVOFLOXACIN 500 MG PO TABS: 500.0000 mg | ORAL_TABLET | Freq: Every day | ORAL | Status: DC

## 2012-11-04 MED ORDER — METOPROLOL TARTRATE 25 MG PO TABS: 25.0000 mg | ORAL_TABLET | Freq: Two times a day (BID) | ORAL | Status: DC

## 2012-11-04 MED ORDER — LISINOPRIL 5 MG PO TABS: 5.0000 mg | ORAL_TABLET | Freq: Every day | ORAL | Status: DC

## 2012-11-04 MED ORDER — LISINOPRIL 5 MG PO TABS
5.0000 mg | ORAL_TABLET | Freq: Two times a day (BID) | ORAL | Status: DC
  Administered 2012-11-04: 5 mg via ORAL
  Filled 2012-11-04 (×2): qty 1

## 2012-11-04 NOTE — Progress Notes (Signed)
[facility] PROGRESS NOTE  Assessment/Plan: *Healthcare-associated pneumonia: - Vanc and zosyn started on 8.23.2014, afebrile with mild leukocytosis changed to oral levaquin on 8.25.2014. - Cultures pending.  Acute systolic decompensated Heart failure: - Resume low dose lasix.- Titrate Beta blocker and ACE-I. - Echo: showed an EF 30%, Right atrium was mildly dilated. - Pulmonary arteries: was high. - Cont. strict I and O's.  Elevated LFTs - Mild elevation.  - hepatitis panel negative. - Surgery does no think is due to her gallbladder.   Code Status: full Family Communication: none  Disposition Plan: inaptient   Consultants:  Surgery  cardiology  Procedures:  [place]  CXR  Antibiotics:  vanc and zosyn 8.23.2014  HPI/Subjective: No complains  Objective: Filed Vitals:   11/03/12 0454 11/03/12 1409 11/03/12 2100 11/04/12 0409  BP: 108/76 102/67 110/76 118/81  Pulse: 80 77 83 73  Temp: 98.1 F (36.7 C) 98.2 F (36.8 C) 98.2 F (36.8 C) 98.2 F (36.8 C)  TempSrc: Oral Oral Oral Oral  Resp: 18 16 18 20   Height:      Weight: 73.936 kg (163 lb)   73.8 kg (162 lb 11.2 oz)  SpO2: 97% 100% 99% 100%    Intake/Output Summary (Last 24 hours) at 11/04/12 0816 Last data filed at 11/04/12 0419  Gross per 24 hour  Intake   1274 ml  Output   2675 ml  Net  -1401 ml   Filed Weights   11/02/12 0500 11/03/12 0454 11/04/12 0409  Weight: 73.5 kg (162 lb 0.6 oz) 73.936 kg (163 lb) 73.8 kg (162 lb 11.2 oz)    Exam:  General: Alert, awake, oriented x3, in no acute distress.  HEENT: No bruits, no goiter. +JVD Heart: Regular rate and rhythm, without murmurs, rubs, gallops.  Lungs: Good air movement, clear to auscultation. Abdomen: Soft, nontender, nondistended, positive bowel sounds.    Data Reviewed: Basic Metabolic Panel:  Recent Labs Lab 10/31/12 1807 11/01/12 0534 11/02/12 0520 11/03/12 0435  NA 136 137 136 137  K 3.6 3.5 3.3* 3.7  CL 100 102 101 101   CO2 22 22 25 30   GLUCOSE 103* 115* 101* 96  BUN 22 26* 23 18  CREATININE 0.67 0.67 0.83 1.31*  CALCIUM 8.4 8.0* 7.9* 7.7*   Liver Function Tests:  Recent Labs Lab 10/31/12 1807 11/01/12 0534  AST 982* 785*  ALT 587* 603*  ALKPHOS 387* 349*  BILITOT 1.0 1.2  PROT 6.1 5.8*  ALBUMIN 2.5* 2.4*    Recent Labs Lab 11/01/12 0058  LIPASE 50   No results found for this basename: AMMONIA,  in the last 168 hours CBC:  Recent Labs Lab 10/31/12 1807 11/01/12 0534  WBC 12.0* 14.9*  NEUTROABS 8.1* 11.3*  HGB 10.9* 11.0*  HCT 34.8* 35.2*  MCV 91.8 91.7  PLT 686* 639*   Cardiac Enzymes:  Recent Labs Lab 11/01/12 0015 11/01/12 0810 11/01/12 1407  CKTOTAL 139  --   --   TROPONINI <0.30 <0.30 <0.30   BNP (last 3 results)  Recent Labs  11/01/12 0015 11/03/12 0435  PROBNP 12102.0* 5653.0*   CBG:  Recent Labs Lab 11/03/12 1538 11/03/12 2006 11/04/12 0009 11/04/12 0413 11/04/12 0758  GLUCAP 105* 155* 118* 88 87    Recent Results (from the past 240 hour(s))  CULTURE, BLOOD (ROUTINE X 2)     Status: None   Collection Time    11/01/12 12:01 AM      Result Value Range Status   Specimen Description BLOOD  RIGHT ARM   Final   Special Requests BOTTLES DRAWN AEROBIC AND ANAEROBIC    Final   Culture  Setup Time     Final   Value: 11/01/2012 05:34     Performed at [facility]   Culture     Final   Value:        BLOOD CULTURE RECEIVED NO GROWTH TO DATE CULTURE WILL BE HELD FOR 5 DAYS BEFORE ISSUING A FINAL NEGATIVE REPORT     Performed at [facility]   Report Status PENDING   Incomplete  CULTURE, BLOOD (ROUTINE X 2)     Status: None   Collection Time    11/01/12 12:40 AM      Result Value Range Status   Specimen Description BLOOD LEFT HAND   Final   Special Requests BOTTLES DRAWN AEROBIC ONLY   Final   Culture  Setup Time     Final   Value: 11/01/2012 05:34     Performed at [facility]   Culture     Final   Value:         BLOOD CULTURE RECEIVED NO GROWTH TO DATE CULTURE WILL BE HELD FOR 5 DAYS BEFORE ISSUING A FINAL NEGATIVE REPORT     Performed at [facility]   Report Status PENDING   Incomplete     Studies: No results found.  Scheduled Meds: . furosemide  10 mg Oral Daily  . lactobacillus acidophilus & bulgar  2 tablet Oral TID  . levofloxacin  500 mg Oral Daily  . lisinopril  5 mg Oral Daily  . metoprolol tartrate  12.5 mg Oral BID  . sodium chloride  3 mL Intravenous Q12H   Continuous Infusions:     Marinda Elk  [facility] Pager [phone]. If 8PM-8AM, please contact night-coverage at [email or web address], password [email or web address] 11/04/2012, 8:16 AM  LOS: 4 days

## 2012-11-04 NOTE — Progress Notes (Signed)
TELEMETRY: Reviewed telemetry pt in NSR: Filed Vitals:   11/03/12 0454 11/03/12 1409 11/03/12 2100 11/04/12 0409  BP: 108/76 102/67 110/76 118/81  Pulse: 80 77 83 73  Temp: 98.1 F (36.7 C) 98.2 F (36.8 C) 98.2 F (36.8 C) 98.2 F (36.8 C)  TempSrc: Oral Oral Oral Oral  Resp: 18 16 18 20   Height:      Weight: 163 lb (73.936 kg)   162 lb 11.2 oz (73.8 kg)  SpO2: 97% 100% 99% 100%    Intake/Output Summary (Last 24 hours) at 11/04/12 0722 Last data filed at 11/04/12 0419  Gross per 24 hour  Intake   1274 ml  Output   2675 ml  Net  -1401 ml    SUBJECTIVE Feeling better. No SOB or orthopnea. Some cough with clear sputum. No chest pain.  LABS: Basic Metabolic Panel:  Recent Labs  57/84/69 0520 11/03/12 0435  NA 136 137  K 3.3* 3.7  CL 101 101  CO2 25 30  GLUCOSE 101* 96  BUN 23 18  CREATININE 0.83 1.31*  CALCIUM 7.9* 7.7*   Cardiac Enzymes:  Recent Labs  2012/11/23 0810 11/23/12 1407  TROPONINI <0.30 <0.30   Radiology/Studies:   Dg Chest Port 1 View  2012-11-23   *RADIOLOGY REPORT*  Clinical Data: Short of breath.  Infiltrates.  PORTABLE CHEST - 1 VIEW  Comparison: 10/31/2012  Findings: Patchy bilateral central lung infiltrates are stable.  Cardiac silhouette is mild to moderately enlarged.  No mediastinal or hilar masses are noted.  There is no pneumothorax or convincing pleural effusion.  IMPRESSION: Persistent bilateral central lung airspace opacities.  This is consistent with multifocal pneumonia but could reflect pulmonary edema.   Original Report Authenticated By: Amie Portland, M.D.   Ecg: NSR, right axis, nonspecific ST-T changes.  Echo: Study Conclusions  - Left ventricle: The cavity size was moderately dilated. Wall thickness was normal. Systolic function was moderately to severely reduced. The estimated ejection fraction was in the range of 30% to 35%. There is akinesis of the anteroseptal and apical myocardium. Doppler parameters are  consistent with high ventricular filling pressure. - Aortic valve: Mild regurgitation. - Mitral valve: Mild to moderate regurgitation. - Left atrium: The atrium was mildly dilated. - Right ventricle: The cavity size was mildly dilated. Systolic function was moderately reduced. - Right atrium: The atrium was mildly dilated. - Pulmonary arteries: Systolic pressure was mildly increased. PA peak pressure: 48mm Hg (S). Impressions:  - Anteroseptal and apical akinesis with moderate to severe reduction in LV function; possible residual septation in left atrium noted on apical 3 chamber view; probable ASD with left to right shunting; suggest TEE or cardiac MRI to further assess if clinically indicated.    PHYSICAL EXAM General: Well developed, well nourished, in no acute distress. Head: Normocephalic, atraumatic, sclera non-icteric, no xanthomas, nares are without discharge. Neck: Negative for carotid bruits. JVD not elevated. Lungs: Clear bilaterally to auscultation without wheezes, rales, or rhonchi. Breathing is unlabored. Heart: RRR S1 S2 without murmurs, rubs, or gallops.  Abdomen: Soft, non-tender, non-distended with normoactive bowel sounds. No hepatomegaly. No rebound/guarding. No obvious abdominal masses. Msk:  Strength and tone appears normal for age. Extremities: No clubbing, cyanosis or edema.  Distal pedal pulses are 2+ and equal bilaterally. Neuro: Alert and oriented X 3. Moves all extremities spontaneously. Psych:  Responds to questions appropriately with a normal affect.  ASSESSMENT AND PLAN: 1. Acute systolic CHF. EF 30-35%. 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 Good response to  cardiac meds with ACEi, beta blocker, and lasix. Good diuresis. Clinically improved. Will need close follow up as outpatient with Dr. Dietrich Pates. Once medications optimized will plan on cardiac MRI. 2. Cor triatriatum s/p ASD repair. 3. Pneumonia 4. Post partum.  Principal Problem:    Healthcare-associated pneumonia Active Problems:   Elevated LFTs   Cholecystitis   Acute systolic congestive heart failure    Signed, Peter [place] MD,FACC 11/04/2012 7:29 AM

## 2012-11-04 NOTE — Discharge Summary (Signed)
Physician Discharge Summary  [name] ZOX:[id number] DOB: Jan 29, 1986 DOA: 10/31/2012  PCP: Ron Parker, MD  Admit date: 10/31/2012 Discharge date: 11/04/2012  Time spent: 50 minutes  Recommendations for Outpatient Follow-up:  Follow up with PCP follow up with cardiology  Discharge Diagnoses:  Principal Problem:   Healthcare-associated pneumonia Active Problems:   Elevated LFTs   Cholecystitis   Acute systolic congestive heart failure   Postpartum cardiomyopathy   Discharge Condition: stable  Diet recommendation:  low sodium diet  Filed Weights   11/02/12 0500 11/03/12 0454 11/04/12 0409  Weight: 73.5 kg (162 lb 0.6 oz) 73.936 kg (163 lb) 73.8 kg (162 lb 11.2 oz)    History of present illness:  27 y.o. female who had recently had cesarean section last month and at that time patient had required at least 8 units of blood transfusion due to bleed presents to the ER at [facility] because of shortness of breath. In the ER patient also had labs done which showed elevated LFTs and was transferred to [facility]. Sonogram shows features concerning for acute acalculus cholecystitis. On-call surgeon was consulted. Patient chest x-ray was showing bilateral infiltrates concerning for pneumonia versus edema. Patient has been admitted for further management. Patient states that over the last one week patient has been having some nausea vomiting and abdominal discomfort. Patient's abdominal discomfort is mostly right upper quadrant and epigastric area. Patient last 2 days has been having increasing shortness of breath. Denies any fevers but had some subjective feeling of chills. Patient's shortness of breath is present even at rest and increase in exertion and has pleuritic type of chest pain. At this time patient has been admitted for further management.  OB/GYN fell that patient may not be having HELLP syndrome given the patient's elevated platelet counts.  Patient 2  weeks ago was given a course of antibiotics for possible endometritis by patient's gynecologist.  Patient has previous history of mitral valve prolapse and a 2-D echo done last year had low EF with inferior wall hypokinesis.   Hospital Course:  *Healthcare-associated pneumonia:  - Vanc and zosyn started on 8.23.2014, afebrile with mild leukocytosis changed to oral levaquin on 8.25.2014.  - Cultures negative.   Acute systolic decompensated Heart failure:  - Resume low dose lasix.- Titrate Beta blocker and ACE-I.  - Echo: showed an EF 30%, Right atrium was mildly dilated. - Pulmonary arteries: was high. BNP on admission >12000. - follow up with cardiology as an outpatient.  Elevated LFTs  - Mild elevation. ? Due to surgery - hepatitis panel negative.  - Surgery does not think is due to her gallbladder.   Procedures:  ECHO  Consultations:  Cardiology  Surgery  Discharge Exam: Filed Vitals:   11/04/12 0409  BP: 118/81  Pulse: 73  Temp: 98.2 F (36.8 C)  Resp: 20    General: A&O x3 Cardiovascular: RRR Respiratory: good air movement CTA B/L  Discharge Instructions     Medication List         cephALEXin 500 MG capsule  Commonly known as:  KEFLEX  Take 1 capsule (500 mg total) by mouth 4 (four) times daily.     FLUoxetine 20 MG capsule  Commonly known as:  PROZAC  Take 20 mg by mouth daily.     furosemide 20 MG tablet  Commonly known as:  LASIX  Take 0.5 tablets (10 mg total) by mouth daily.     ibuprofen 200 MG tablet  Commonly known as:  ADVIL,MOTRIN  Take 200 mg by mouth every 6 (six) hours as needed for pain.     levofloxacin 500 MG tablet  Commonly known as:  LEVAQUIN  Take 1 tablet (500 mg total) by mouth daily.     lisinopril 5 MG tablet  Commonly known as:  PRINIVIL,ZESTRIL  Take 1 tablet (5 mg total) by mouth daily.     loratadine 10 MG tablet  Commonly known as:  CLARITIN  Take 10 mg by mouth daily.     metoprolol tartrate 25 MG tablet   Commonly known as:  LOPRESSOR  Take 1 tablet (25 mg total) by mouth 2 (two) times daily.     oxyCODONE-acetaminophen 5-325 MG per tablet  Commonly known as:  PERCOCET/ROXICET  Take 1-2 tablets by mouth every 4 (four) hours as needed.     phenylephrine 10 MG Tabs tablet  Commonly known as:  SUDAFED PE  Take 10 mg by mouth every 4 (four) hours as needed (congestion).     prenatal multivitamin Tabs tablet  Take 1 tablet by mouth every morning.     promethazine 25 MG tablet  Commonly known as:  PHENERGAN  Take 1 tablet (25 mg total) by mouth every 6 (six) hours as needed for nausea.     valACYclovir 500 MG tablet  Commonly known as:  VALTREX  Take 500 mg by mouth daily as needed (for outbreak).       Allergies  Allergen Reactions  . Mucinex [Guaifenesin Er] Shortness Of Breath       Follow-up Information   Follow up with Ron Parker, MD In 2 weeks.   Specialty:  Internal Medicine   Contact information:   [address] [place] [address] [phone]       Follow up with Dietrich Pates, MD In 2 weeks. (hospital follow up)    Specialty:  Cardiology   Contact information:   [address] [place] [address] [phone]        The results of significant diagnostics from this hospitalization (including imaging, microbiology, ancillary and laboratory) are listed below for reference.    Significant Diagnostic Studies: Ct Abdomen Pelvis Wo Contrast  10/07/2012   *RADIOLOGY REPORT*  Clinical Data: Status post C-section on October 06, 2012.  Hemoglobin dropped to 6.4.  Abdomen pain.  Evaluate for hematoma.  CT ABDOMEN AND PELVIS WITHOUT CONTRAST  Technique:  Multidetector CT imaging of the abdomen and pelvis was performed following the standard protocol without intravenous contrast.  Comparison: None.  Findings: The liver, spleen, pancreas, adrenal glands and right kidney are normal.  There is a 4 mm nonobstructing stone within the left  kidney.  There is no hydronephrosis bilaterally.  There is probable sludge in gallbladder.  There is extensive fluid and hemorrhage throughout the abdomen and pelvis. There is a large hematoma starting from inferior to the uterus extending to the surround the uterus and extending to the mid to superior aspect of the uterus measuring at least 18 cm in diameter and 10 cm in anterior-posterior dimension.  The uterus is enlarged with an area of hemorrhage is identified within the endometrial cavity extending into the cervix. There are postsurgical changes from the recent C-section with skin staples anterior pelvic subcutaneous air and fluid and a small amount of free air in the pelvis.  The bladder is decompressed with a Foley catheter in place.  There is atelectasis of the visualized lung bases.  IMPRESSION: Extensive fluid and hemorrhage throughout the  abdomen and pelvis with a large hematoma surrounding the uterus.  There is blood and air identified within the endometrial cavity extending to the cervical canal. Postoperative changes from a recent C-section.  The results were called to Dr. Tenny Craw today at 9:55 a.m.   Original Report Authenticated By: Sherian Rein, M.D.   Dg Chest 2 View  10/31/2012   *RADIOLOGY REPORT*  Clinical Data: 3 weeks postpartum.  Shortness of breath with productive cough.  CHEST - 2 VIEW  Comparison: 10/08/2012  Findings: Lungs are adequately inflated demonstrate moderate perihilar bibasilar opacification right worse than left which may be due to edema versus multi focal pneumonia. No definite effusions seen.  Moderate stable cardiomegaly.  Surgical clips over the anterior/superior mediastinum unchanged.  Remainder of the exam is unchanged.  IMPRESSION: Worsening bilateral perihilar bibasilar opacification right worse than left suggesting multifocal pneumonia versus edema.  Stable cardiomegaly.   Original Report Authenticated By: Elberta Fortis, M.D.   Dg Abd 1 View  10/16/2012   *RADIOLOGY  REPORT*  Clinical Data: Abdominal pain.  Status post C-section and 10/06/2012.  ABDOMEN - 1 VIEW  Comparison: CT of the abdomen and 10/07/2012.  Findings: Gas and stool are noted throughout the colon, most pronounced in the region of the transverse colon.  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  No definite pathologic distension of small bowel.  No gross evidence of pneumoperitoneum.  IMPRESSION: 1.  Nonobstructive bowel gas pattern, as above. 2.  No pneumoperitoneum. 3.  Paucity of bowel gas throughout the lower abdomen and pelvis may simply relate to the patient's post gravid uterus, however, the possibility of recurrent hemorrhage in this region is not excluded. If there is clinical concern for recurrent hemorrhage which could cause mass effect accounting for these findings, a repeat CT scan with IV contrast would be recommended at this time.   Original Report Authenticated By: Trudie Reed, M.D.   US Abdomen Complete  10/31/2012   *RADIOLOGY REPORT*  Clinical Data:  Right upper quadrant pain.  Elevated liver function studies.  COMPLETE ABDOMINAL ULTRASOUND  Comparison:  10/07/2012  Findings:  Gallbladder:  Diffuse gallbladder wall thickening and edema.  Wall thickness measures from between 4-7 mm.  No gallstones or sludge are visualized but there is a positive Murphy's sign.  Findings may represent inflammatory process or acalculus cholecystitis.  Common bile duct:  Normal caliber with measured diameter of 2 mm.  Liver:  Small amount of fluid is noted around the liver.  Compared with the previous CT scan, the amount of ascites appears to be decreased.  No focal liver lesions.  IVC:  Appears normal.  Pancreas:  No focal abnormality seen.  Spleen:  Spleen length measures  8.7 cm.  Normal parenchymal echotexture.  Right Kidney:  Right kidney measures 11.6 cm length.  No hydronephrosis.  Left Kidney:  Left kidney measures 12.8 cm length.  No hydronephrosis.  Abdominal aorta:  No aneurysm identified.  IMPRESSION: Gallbladder wall is thickened and edematous in the Murphy's sign is positive.  No stones or sludge are visualized.  Changes could represent inflammatory process, edema, or acalculus cholecystitis. Small amount of fluid around the liver edge appears to be decreased since previous CT.   Original Report Authenticated By: Burman Nieves, M.D.   US Ob  Limited  10/06/2012   *RADIOLOGY REPORT*  Clinical Data: Evaluate presentation  LIMITED OBSTETRIC ULTRASOUND  Number of Fetuses: 1 Heart Rate: 139 bpm Movement: Present  Presentation: Breech  Examination was only performed for presentation hand done portably. The amniotic fluid was not evaluated.  IMPRESSION: Single breech presentation with fetal heart tones of 139 beats per minute.  Recommend followup with non-emergent complete OB 14+ wk US examination for fetal biometric evaluation and anatomic survey if not already performed.   Original Report Authenticated By: Andreas Newport, M.D.   US Ob Limited  10/05/2012   *RADIOLOGY REPORT*  Clinical Data: Possible premature rupture of membranes, nonreactive NST  LIMITED OBSTETRIC ULTRASOUND  Number of Fetuses: 1 Heart Rate: 155 bpm Movement: Present Presentation: Breech Placental Location: Fundal Previa: None Amniotic Fluid (Subjective): Normal  Vertical Pocket 6.4 cm     AFI 19.6 cm  (5%ile = 7.7 cm; 95%ile = 24.9 cm)  BPP:  8/8  BPD:  7.57 cm     30 w  3 d         EDC:  12/11/2012  MATERNAL FINDINGS: Cervix: Not evaluated Uterus/Adnexae:  The right ovary remains enlarged measuring approximately 9.8 x 3.9 x 9.0 cm (previously, 10.1 x 7.2 x 9.8 cm) and contains multiple peripheral anechoic cysts.  The left ovary is not visualized on the present examination.  IMPRESSION: 1.   Uncomplicated viable single intrauterine pregnancy.  BPP - 8/8  2.  Persistent enlargement of the right ovary which again raises the possibility of polycystic ovarian disease.  The left ovary was not visualized on the present examination.  Recommend followup with non-emergent complete OB 14+ wk US examination for fetal biometric evaluation and anatomic survey if not already performed.   Original Report Authenticated By: Tacey Ruiz, MD   US Fetal Bpp W/o Non Stress  10/05/2012   *RADIOLOGY REPORT*  Clinical Data: Possible premature rupture of membranes, nonreactive NST  LIMITED OBSTETRIC ULTRASOUND  Number of Fetuses: 1 Heart Rate: 155 bpm Movement: Present Presentation: Breech Placental Location: Fundal Previa: None Amniotic Fluid (Subjective): Normal  Vertical Pocket 6.4 cm     AFI 19.6 cm  (5%ile = 7.7 cm; 95%ile = 24.9 cm)  BPP:  8/8  BPD:  7.57 cm     30 w  3 d         EDC:  12/11/2012  MATERNAL FINDINGS: Cervix: Not evaluated Uterus/Adnexae:  The right ovary remains enlarged measuring approximately 9.8 x 3.9 x 9.0 cm (previously, 10.1 x 7.2 x 9.8 cm) and contains multiple peripheral anechoic cysts.  The left ovary is not visualized on the present examination.  IMPRESSION: 1.  Uncomplicated viable single intrauterine pregnancy.  BPP - 8/8  2.  Persistent enlargement of the right ovary which again raises the possibility of polycystic ovarian disease.  The left ovary was not visualized on the present examination.  Recommend followup with non-emergent complete OB 14+ wk US examination for fetal biometric evaluation and anatomic survey if not already performed.   Original Report Authenticated By: Tacey Ruiz, MD   Dg Chest Port 1 View  11/01/2012   *RADIOLOGY REPORT*  Clinical Data: Short of breath.  Infiltrates.  PORTABLE CHEST - 1 VIEW  Comparison: 10/31/2012  Findings: Patchy bilateral central lung infiltrates are stable.  Cardiac silhouette is mild to moderately enlarged.  No mediastinal or hilar masses  are noted.  There is no pneumothorax or convincing pleural effusion.  IMPRESSION: Persistent bilateral central lung airspace opacities.  This is consistent with multifocal pneumonia but could reflect pulmonary edema.   Original Report Authenticated By: Amie Portland, M.D.   Dg Chest Port 1 View  10/08/2012   *RADIOLOGY REPORT*  Clinical Data: Confirm line placement  PORTABLE CHEST - 1 VIEW  Comparison: 03/14/1998 third  Findings: Right upper extremity PICC projects over the mid superior vena cava, at approximately the level of the carina.  Surgical clips project over the upper chest.  Enlargement cardiopericardial silhouette is noted.  There is pulmonary vascular congestion.  No focal airspace opacities or pleural effusion.  There is slight respiratory motion.  Negative for pneumothorax.  Nasogastric tube courses into the stomach and continues below the edge of the image.  IMPRESSION:  1.  Right upper extremity PICC projects over the mid superior vena cava. 2.  Cardiac enlargement.  With pulmonary vascular congestion   Original Report Authenticated By: Britta Mccreedy, M.D.    Microbiology: Recent Results (from the past 240 hour(s))  CULTURE, BLOOD (ROUTINE X 2)     Status: None   Collection Time    11/01/12 12:01 AM      Result Value Range Status   Specimen Description BLOOD RIGHT ARM   Final   Special Requests BOTTLES DRAWN AEROBIC AND ANAEROBIC    Final   Culture  Setup Time     Final   Value: 11/01/2012 05:34     Performed at [facility]   Culture     Final   Value:        BLOOD CULTURE RECEIVED NO GROWTH TO DATE CULTURE WILL BE HELD FOR 5 DAYS BEFORE ISSUING A FINAL NEGATIVE REPORT     Performed at [facility]   Report Status PENDING   Incomplete  CULTURE, BLOOD (ROUTINE X 2)     Status: None   Collection Time    11/01/12 12:40 AM      Result Value Range Status   Specimen Description BLOOD LEFT HAND   Final   Special Requests BOTTLES DRAWN AEROBIC ONLY   Final    Culture  Setup Time     Final   Value: 11/01/2012 05:34     Performed at [facility]   Culture     Final   Value:        BLOOD CULTURE RECEIVED NO GROWTH TO DATE CULTURE WILL BE HELD FOR 5 DAYS BEFORE ISSUING A FINAL NEGATIVE REPORT     Performed at [facility]   Report Status PENDING   Incomplete     Labs: Basic Metabolic Panel:  Recent Labs Lab 10/31/12 1807 11/01/12 0534 11/02/12 0520 11/03/12 0435  NA 136 137 136 137  K 3.6 3.5 3.3* 3.7  CL 100 102 101 101  CO2 22 22 25 30   GLUCOSE 103* 115* 101* 96  BUN 22 26* 23 18  CREATININE 0.67 0.67 0.83 1.31*  CALCIUM 8.4 8.0* 7.9* 7.7*   Liver Function Tests:  Recent Labs Lab 10/31/12 1807 11/01/12 0534  AST 982* 785*  ALT 587* 603*  ALKPHOS 387* 349*  BILITOT 1.0 1.2  PROT 6.1 5.8*  ALBUMIN 2.5* 2.4*    Recent Labs Lab 11/01/12 0058  LIPASE 50   No results found for this basename: AMMONIA,  in the last 168 hours CBC:  Recent Labs Lab 10/31/12 1807 11/01/12 0534  WBC 12.0* 14.9*  NEUTROABS 8.1* 11.3*  HGB 10.9* 11.0*  HCT 34.8* 35.2*  MCV 91.8 91.7  PLT 686* 639*  Cardiac Enzymes:  Recent Labs Lab 11/01/12 0015 11/01/12 0810 11/01/12 1407  CKTOTAL 139  --   --   TROPONINI <0.30 <0.30 <0.30   BNP: BNP (last 3 results)  Recent Labs  11/01/12 0015 11/03/12 0435  PROBNP 12102.0* 5653.0*   CBG:  Recent Labs Lab 11/03/12 1538 11/03/12 2006 11/04/12 0009 11/04/12 0413 11/04/12 0758  GLUCAP 105* 155* 118* 88 87       Signed:  Marinda Elk  [facility] 11/04/2012, 8:23 AM

## 2012-11-04 NOTE — Progress Notes (Signed)
Patient had court date this morning for traffic violation. CSW faxed letter to district attorney's office and gave a copy to patient.  Vela Render C. Rudell Marlowe MSW, LCSW [phone]

## 2012-11-07 LAB — CULTURE, BLOOD (ROUTINE X 2)
Culture: NO GROWTH
Culture: NO GROWTH

## 2012-11-28 ENCOUNTER — Ambulatory Visit: Payer: Medicaid Other | Admitting: Internal Medicine

## 2012-12-29 ENCOUNTER — Encounter: Payer: Self-pay | Admitting: Internal Medicine

## 2012-12-29 ENCOUNTER — Ambulatory Visit (INDEPENDENT_AMBULATORY_CARE_PROVIDER_SITE_OTHER): Payer: Medicaid Other | Admitting: Internal Medicine

## 2012-12-29 VITALS — BP 124/69 | HR 68 | Wt 154.0 lb

## 2012-12-29 DIAGNOSIS — I428 Other cardiomyopathies: Secondary | ICD-10-CM

## 2012-12-29 DIAGNOSIS — I429 Cardiomyopathy, unspecified: Secondary | ICD-10-CM

## 2012-12-29 LAB — BASIC METABOLIC PANEL
BUN: 24 mg/dL — ABNORMAL HIGH (ref 6–23)
Calcium: 9.7 mg/dL (ref 8.4–10.5)
Creatinine, Ser: 0.7 mg/dL (ref 0.4–1.2)
GFR: 116.31 mL/min (ref >60.00)

## 2012-12-29 LAB — CBC
HCT: 41.8 % (ref 36.0–46.0)
RDW: 17 % — ABNORMAL HIGH (ref 11.5–14.6)
WBC: 8.4 10*3/uL (ref 4.5–10.5)

## 2012-12-29 NOTE — Patient Instructions (Signed)
Your physician recommends that you return for lab work in: today  Your physician has requested that you have an echocardiogram in February 2015  Echocardiography is a painless test that uses sound waves to create images of your heart. It provides your doctor with information about the size and shape of your heart and how well your heart's chambers and valves are working. This procedure takes approximately one hour. There are no restrictions for this procedure.  Your physician recommends that you schedule a follow-up appointment in: after echo

## 2012-12-29 NOTE — Progress Notes (Signed)
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  I last saw her in 2013. Patient was also seen by Wende Mott The patient is now post partum  Underwent C Section that was complicated.  Received 8U PRBC. The patient was admitted in August with SOB  Found to be in pulmonary edema.  Echo showed LVEF was depressed at 30%   The patient was diuresed.  Since d/c she still tires but not like prior to admission.  Denies edema.  No CP  No PND    Allergies  Allergen Reactions  . Mucinex [Guaifenesin Er] Shortness Of Breath    Current Outpatient Prescriptions  Medication Sig Dispense Refill  . FLUoxetine (PROZAC) 20 MG capsule Take 20 mg by mouth daily.      . furosemide (LASIX) 20 MG tablet Take 0.5 tablets (10 mg total) by mouth daily.  30 tablet  0  . ibuprofen (ADVIL,MOTRIN) 200 MG tablet Take 200 mg by mouth every 6 (six) hours as needed for pain.      Marland Kitchen lisinopril (PRINIVIL,ZESTRIL) 5 MG tablet Take 1 tablet (5 mg total) by mouth daily.  30 tablet  0  . loratadine (CLARITIN) 10 MG tablet Take 10 mg by mouth daily.      . metoprolol tartrate (LOPRESSOR) 25 MG tablet Take 1 tablet (25 mg total) by mouth 2 (two) times daily.  60 tablet  0  . phenylephrine (SUDAFED PE) 10 MG TABS Take 10 mg by mouth every 4 (four) hours as needed (congestion).      . Prenatal Vit-Fe Fumarate-FA (PRENATAL MULTIVITAMIN) TABS Take 1 tablet by mouth every morning.       . valACYclovir (VALTREX) 500 MG tablet Take 500 mg by mouth daily as needed (for outbreak).        No current facility-administered medications for this visit.    Past Medical History  Diagnosis Date  . Herpes genitalia   . HSV (herpes simplex virus) infection     last outbreak in may  . Mitral valve prolapse   . Status post repair of cor triatriatum     Echocardiogram 12/05: EF 55-65%, no mitral valve prolapse, mild RVE and no mention of any residual ASD.    Marland Kitchen Abnormal Pap smear   . Gestational  diabetes   . Infection     UTI  . Anxiety     on meds  . Healthcare-associated pneumonia 10/31/2012    Past Surgical History  Procedure Laterality Date  . Cardiac surgery      congenital heart defect repair at 9 mos  . Exploration post operative open heart    . Cesarean section N/A 10/06/2012    Procedure: Primary CESAREAN SECTION  of baby girl at 0347 APGAR 8/8;  Surgeon: Philip Aspen, DO;  Location: [facility];  Service: Obstetrics;  Laterality: N/A;  . Laparotomy N/A 10/07/2012    Procedure: EXPLORATORY LAPAROTOMY;  Surgeon: Freddrick March. Tenny Craw, MD;  Location: [facility];  Service: Gynecology;  Laterality: N/A;    Family History  Problem Relation Age of Onset  . Diabetes Mother   . Asthma Mother   . Diabetes Father   . Asthma Father   . Diabetes Maternal Grandmother   . Diabetes Paternal Grandfather     History   Social History  . Marital Status: Married    Spouse Name: N/A    Number of Children: N/A  . Years of Education:  N/A   Occupational History  . Not on file.   Social History Main Topics  . Smoking status: Former Games developer  . Smokeless tobacco: Never Used     Comment: quit 1st trimester  . Alcohol Use: Yes     Comment: occasional alcohol, not with preg  . Drug Use: No  . Sexual Activity: Yes    Birth Control/ Protection: None   Other Topics Concern  . Not on file   Social History Narrative  . No narrative on file    Review of Systems:  All systems reviewed.  They are negative to the above problem except as previously stated.  Vital Signs: Filed Vitals:   12/29/12 1138  BP: 124/69  Pulse: 68   Filed Vitals:   12/29/12 1138  Weight: 154 lb (69.854 kg)     Physical Exam  Patient is in NAD  HEENT:  Normocephalic, atraumatic. EOMI, PERRLA.  Neck: JVP is normal. No thyromegaly. No bruits.  Lungs: clear to auscultation. No rales no wheezes.  Heart: Regular rate and rhythm. Normal S1, S2. No S3.   No significant murmurs. PMI not displaced. Chest:   Tender to palpation.  Reproduces pain.  Abdomen:  Supple, nontender. Normal bowel sounds. No masses. No hepatomegaly.  Extremities:   Good distal pulses throughout. No lower extremity edema.  Musculoskeletal :moving all extremities.  Neuro:   alert and oriented x3.  CN II-XII grossly intact. Assessment and Plan:  1.  Peripartum CM  I have reviewed echo  INdeed her ef is down.  On exam volume status is OK   I would keep on same regimen  Watch salt  Patient will need f/u echo this winter Would not recomm further pregnancies. Would get labs  2.  Hx Cor triatriatum

## 2013-07-01 ENCOUNTER — Emergency Department (HOSPITAL_COMMUNITY): Payer: Medicaid Other

## 2013-07-01 ENCOUNTER — Encounter (HOSPITAL_COMMUNITY): Payer: Self-pay | Admitting: Emergency Medicine

## 2013-07-01 ENCOUNTER — Emergency Department (HOSPITAL_COMMUNITY)
Admission: EM | Admit: 2013-07-01 | Discharge: 2013-07-02 | Disposition: A | Discharge status: Home / Self Care | Payer: Self-pay | Attending: Emergency Medicine | Admitting: Emergency Medicine

## 2013-07-01 DIAGNOSIS — Z87440 Personal history of urinary (tract) infections: Secondary | ICD-10-CM | POA: Insufficient documentation

## 2013-07-01 DIAGNOSIS — I059 Rheumatic mitral valve disease, unspecified: Secondary | ICD-10-CM | POA: Insufficient documentation

## 2013-07-01 DIAGNOSIS — F411 Generalized anxiety disorder: Secondary | ICD-10-CM | POA: Insufficient documentation

## 2013-07-01 DIAGNOSIS — Z8619 Personal history of other infectious and parasitic diseases: Secondary | ICD-10-CM | POA: Insufficient documentation

## 2013-07-01 DIAGNOSIS — Z8701 Personal history of pneumonia (recurrent): Secondary | ICD-10-CM | POA: Insufficient documentation

## 2013-07-01 DIAGNOSIS — Z87891 Personal history of nicotine dependence: Secondary | ICD-10-CM | POA: Insufficient documentation

## 2013-07-01 DIAGNOSIS — M26629 Arthralgia of temporomandibular joint, unspecified side: Secondary | ICD-10-CM

## 2013-07-01 DIAGNOSIS — K029 Dental caries, unspecified: Secondary | ICD-10-CM | POA: Insufficient documentation

## 2013-07-01 DIAGNOSIS — M26609 Unspecified temporomandibular joint disorder, unspecified side: Secondary | ICD-10-CM | POA: Insufficient documentation

## 2013-07-01 DIAGNOSIS — Z79899 Other long term (current) drug therapy: Secondary | ICD-10-CM | POA: Insufficient documentation

## 2013-07-01 DIAGNOSIS — Z9889 Other specified postprocedural states: Secondary | ICD-10-CM | POA: Insufficient documentation

## 2013-07-01 MED ORDER — OXYCODONE-ACETAMINOPHEN 5-325 MG PO TABS
1.0000 | ORAL_TABLET | Freq: Once | ORAL | Status: AC
  Administered 2013-07-01: 1 via ORAL
  Filled 2013-07-01: qty 1

## 2013-07-01 MED ORDER — IOHEXOL 300 MG/ML  SOLN
75.0000 mL | Freq: Once | INTRAMUSCULAR | Status: AC | PRN
  Administered 2013-07-01: 75 mL via INTRAVENOUS

## 2013-07-01 NOTE — ED Notes (Signed)
Pt report that since last Wednesday jaw has become tighter and tighter and now she is unable to open mouth. Has only been eating soup and jello. No hx of same in past. sts she has cavity to lower R molar that needs filled. Pt reports pain from ear down to throat.

## 2013-07-01 NOTE — ED Provider Notes (Signed)
CSN: [id number]     Arrival date & time 07/01/13  1633 History   First MD Initiated Contact with Patient 07/01/13 2007     Chief Complaint  Patient presents with  . Jaw Pain     (Consider location/radiation/quality/duration/timing/severity/associated sxs/prior Treatment) HPI Comments: She complains of right sided jaw pain over the last 7 days that is progressive. She is unable to open her mouth to eat more than soft foods and liquids. No fever. She has known dental caries in the right rear molars, however, she denies history of TMJ problems in the past. No nausea or vomiting. No facial swelling or difficulty swallowing.   The history is provided by the patient.    Past Medical History  Diagnosis Date  . Herpes genitalia   . HSV (herpes simplex virus) infection     last outbreak in may  . Mitral valve prolapse   . Status post repair of cor triatriatum     Echocardiogram 12/05: EF 55-65%, no mitral valve prolapse, mild RVE and no mention of any residual ASD.    Marland Kitchen Abnormal Pap smear   . Gestational diabetes   . Infection     UTI  . Anxiety     on meds  . Healthcare-associated pneumonia 10/31/2012   Past Surgical History  Procedure Laterality Date  . Cardiac surgery      congenital heart defect repair at 9 mos  . Exploration post operative open heart    . Cesarean section N/A 10/06/2012    Procedure: Primary CESAREAN SECTION  of baby girl at 0347 APGAR 8/8;  Surgeon: Philip Aspen, DO;  Location: [facility];  Service: Obstetrics;  Laterality: N/A;  . Laparotomy N/A 10/07/2012    Procedure: EXPLORATORY LAPAROTOMY;  Surgeon: Freddrick March. Tenny Craw, MD;  Location: [facility];  Service: Gynecology;  Laterality: N/A;   Family History  Problem Relation Age of Onset  . Diabetes Mother   . Asthma Mother   . Diabetes Father   . Asthma Father   . Diabetes Maternal Grandmother   . Diabetes Paternal Grandfather    History  Substance Use Topics  . Smoking status: Former Games developer  . Smokeless  tobacco: Never Used     Comment: quit 1st trimester  . Alcohol Use: Yes     Comment: occasional alcohol, not with preg   OB History   Grav Para Term Preterm Abortions TAB SAB Ect Mult Living   3 2 1 1 1  0 1 0 0 2     Review of Systems  Constitutional: Negative for fever and chills.  HENT: Negative for facial swelling.        See HPI.  Respiratory: Negative.  Negative for shortness of breath.   Cardiovascular: Negative.  Negative for chest pain.  Gastrointestinal: Negative.  Negative for nausea.  Skin: Negative.   Neurological: Negative.  Negative for headaches.      Allergies  Mucinex  Home Medications   Prior to Admission medications   Medication Sig Start Date End Date Taking? Authorizing Provider  FLUoxetine (PROZAC) 20 MG capsule Take 20 mg by mouth daily.   Yes Historical Provider, MD  furosemide (LASIX) 20 MG tablet Take 0.5 tablets (10 mg total) by mouth daily. 11/04/12  Yes Marinda Elk, MD  ibuprofen (ADVIL,MOTRIN) 200 MG tablet Take 200 mg by mouth every 6 (six) hours as needed for pain.   Yes Historical Provider, MD  lisinopril (PRINIVIL,ZESTRIL) 5 MG tablet Take 1 tablet (5 mg total) by mouth  daily. 11/04/12  Yes Marinda ElkAbraham Feliz Ortiz, MD  loratadine (CLARITIN) 10 MG tablet Take 10 mg by mouth daily.   Yes Historical Provider, MD  metoprolol tartrate (LOPRESSOR) 25 MG tablet Take 1 tablet (25 mg total) by mouth 2 (two) times daily. 11/04/12  Yes Marinda ElkAbraham Feliz Ortiz, MD  valACYclovir (VALTREX) 500 MG tablet Take 500 mg by mouth daily as needed (for outbreak).    Yes Historical Provider, MD   BP 116/70  Pulse 60  Temp(Src) 98.5 F (36.9 C) (Axillary)  Resp 16  Ht 4\' 10"  (1.473 m)  Wt 168 lb (76.204 kg)  BMI 35.12 kg/m2  SpO2 99%  LMP 06/17/2013 Physical Exam  Constitutional: She is oriented to person, place, and time. She appears well-developed and well-nourished. No distress.  HENT:  Right Ear: External ear normal.  Left Ear: External ear normal.  No  facial swelling. There is significant trismus, unable to visualize dentition or oropharynx.  Neck: Normal range of motion.  Abdominal: Soft.  Lymphadenopathy:    She has no cervical adenopathy.  Neurological: She is alert and oriented to person, place, and time.  Skin: Skin is warm and dry.  Psychiatric: She has a normal mood and affect.    ED Course  Procedures (including critical care time) Labs Review Labs Reviewed - No data to display Ct Maxillofacial W/cm  07/01/2013   EXAM: CT MAXILLOFACIAL WITH CONTRAST  TECHNIQUE: Multidetector CT imaging of the maxillofacial structures was performed with intravenous contrast. Multiplanar CT image reconstructions were also generated. A small metallic BB was placed on the right temple in order to reliably differentiate right from left.  CONTRAST:  75mL OMNIPAQUE IOHEXOL 300 MG/ML  SOLN  COMPARISON:  None.  FINDINGS: The visualized portions of the brain and posterior fossa demonstrate a normal appearance. Globes are normal.  Small retention cyst noted within the floor of the right maxillary sinus. Otherwise, the paranasal sinuses and mastoid air well pneumatized and free of fluid.  The salivary glands including the parotid glands and submandibular glands are normal.  The oral cavity is within normal limits without evidence of loculated fluid collection or mass lesion. Palatine tonsils are symmetric and normal bilaterally. Parapharyngeal fat is preserved. Nasopharynx is normal. No retropharyngeal fluid collection. Hypopharynx and supraglottic airway are normal. Vallecula and epiglottis are within normal limits. Mild prominence of the lingual tonsils is noted, otherwise unremarkable.  Periapical lucency seen about the base of the right third mandibular molar (series 3, image 26). A focal dental carie seen at the medial aspect of the tooth (series 3, image 26). No associated odontogenic abscess. No significant inflammatory changes seen within the face. Mildly  prominent level 2 adenopathy measuring up to 1.1 cm is seen bilaterally. Shotty right level IB nodes measure up to 6 mm in short axis.  Focal hypodense nodule measuring 8 mm partially visualized within the left lobe of thyroid. Visualized thyroid gland is otherwise unremarkable.  Normal intravascular enhancement seen within the neck.  No acute osseous abnormality. No worrisome lytic or blastic osseous lesion.  IMPRESSION: 1. Dental carie with associated periapical lucency about the right third mandibular molar, suggestive of apical periodontitis. No odontogenic abscess or significant inflammatory changes identified within the face or visualized neck. 2. Mildly prominent level II cervical lymph nodes bilaterally, likely reactive in nature.   Electronically Signed   By: Rise MuBenjamin  McClintock M.D.   On: 07/01/2013 23:29   Imaging Review No results found.   EKG Interpretation None  MDM   Final diagnoses:  None    1. Trismus 2. Facial pain 3. Dental disease.  No evidence of bony abnormality - dislocation or fx of mandible/TMJ. There is evidence of dental caries without abscess. No other findings to identify cause of right TMJ pain. Will treat with antibiotics (periodonititis) and pain management.     Arnoldo HookerShari A Ratasha Fabre, PA-C 07/02/13 0001

## 2013-07-02 MED ORDER — OXYCODONE-ACETAMINOPHEN 5-325 MG PO TABS: 1.0000 | ORAL_TABLET | ORAL | Status: DC | PRN

## 2013-07-02 MED ORDER — PENICILLIN V POTASSIUM 250 MG PO TABS: 500.0000 mg | ORAL_TABLET | Freq: Four times a day (QID) | ORAL | Status: AC

## 2013-07-02 NOTE — ED Provider Notes (Signed)
Medical screening examination/treatment/procedure(s) were conducted as a shared visit with non-physician practitioner(s) and myself.  I personally evaluated the patient during the encounter.   EKG Interpretation None      CT scan obtained to evaluate for cause of trismus and right-sided jaw pain.  I suspect the patient's mandible is not dislocated.  Pain will be treated.  Ct Maxillofacial W/cm  07/01/2013   EXAM: CT MAXILLOFACIAL WITH CONTRAST  TECHNIQUE: Multidetector CT imaging of the maxillofacial structures was performed with intravenous contrast. Multiplanar CT image reconstructions were also generated. A small metallic BB was placed on the right temple in order to reliably differentiate right from left.  CONTRAST:  75mL OMNIPAQUE IOHEXOL 300 MG/ML  SOLN  COMPARISON:  None.  FINDINGS: The visualized portions of the brain and posterior fossa demonstrate a normal appearance. Globes are normal.  Small retention cyst noted within the floor of the right maxillary sinus. Otherwise, the paranasal sinuses and mastoid air well pneumatized and free of fluid.  The salivary glands including the parotid glands and submandibular glands are normal.  The oral cavity is within normal limits without evidence of loculated fluid collection or mass lesion. Palatine tonsils are symmetric and normal bilaterally. Parapharyngeal fat is preserved. Nasopharynx is normal. No retropharyngeal fluid collection. Hypopharynx and supraglottic airway are normal. Vallecula and epiglottis are within normal limits. Mild prominence of the lingual tonsils is noted, otherwise unremarkable.  Periapical lucency seen about the base of the right third mandibular molar (series 3, image 26). A focal dental carie seen at the medial aspect of the tooth (series 3, image 26). No associated odontogenic abscess. No significant inflammatory changes seen within the face. Mildly prominent level 2 adenopathy measuring up to 1.1 cm is seen bilaterally.  Shotty right level IB nodes measure up to 6 mm in short axis.  Focal hypodense nodule measuring 8 mm partially visualized within the left lobe of thyroid. Visualized thyroid gland is otherwise unremarkable.  Normal intravascular enhancement seen within the neck.  No acute osseous abnormality. No worrisome lytic or blastic osseous lesion.  IMPRESSION: 1. Dental carie with associated periapical lucency about the right third mandibular molar, suggestive of apical periodontitis. No odontogenic abscess or significant inflammatory changes identified within the face or visualized neck. 2. Mildly prominent level II cervical lymph nodes bilaterally, likely reactive in nature.   Electronically Signed   By: Rise MuBenjamin  McClintock M.D.   On: 07/01/2013 23:29  I personally reviewed the imaging tests through PACS system I reviewed available ER/hospitalization records through the EMR   Lyanne CoKevin M Dash Cardarelli, MD 07/02/13 0025

## 2013-07-02 NOTE — Discharge Instructions (Signed)
Dental Care and Dentist Visits °Dental care supports good overall health. Regular dental visits can also help you avoid dental pain, bleeding, infection, and other more serious health problems in the future. It is important to keep the mouth healthy because diseases in the teeth, gums, and other oral tissues can spread to other areas of the body. Some problems, such as diabetes, heart disease, and pre-term labor have been associated with poor oral health.  °See your dentist every 6 months. If you experience emergency problems such as a toothache or broken tooth, go to the dentist right away. If you see your dentist regularly, you may catch problems early. It is easier to be treated for problems in the early stages.  °WHAT TO EXPECT AT A DENTIST VISIT  °Your dentist will look for many common oral health problems and recommend proper treatment. At your regular dental visit, you can expect: °· Gentle cleaning of the teeth and gums. This includes scraping and polishing. This helps to remove the sticky substance around the teeth and gums (plaque). Plaque forms in the mouth shortly after eating. Over time, plaque hardens on the teeth as tartar. If tartar is not removed regularly, it can cause problems. Cleaning also helps remove stains. °· Periodic X-rays. These pictures of the teeth and supporting bone will help your dentist assess the health of your teeth. °· Periodic fluoride treatments. Fluoride is a natural mineral shown to help strengthen teeth. Fluoride treatment involves applying a fluoride gel or varnish to the teeth. It is most commonly done in children. °· Examination of the mouth, tongue, jaws, teeth, and gums to look for any oral health problems, such as: °· Cavities (dental caries). This is decay on the tooth caused by plaque, sugar, and acid in the mouth. It is best to catch a cavity when it is small. °· Inflammation of the gums caused by plaque buildup (gingivitis). °· Problems with the mouth or malformed  or misaligned teeth. °· Oral cancer or other diseases of the soft tissues or jaws.  °KEEP YOUR TEETH AND GUMS HEALTHY °For healthy teeth and gums, follow these general guidelines as well as your dentist's specific advice: °· Have your teeth professionally cleaned at the dentist every 6 months. °· Brush twice daily with a fluoride toothpaste. °· Floss your teeth daily.  °· Ask your dentist if you need fluoride supplements, treatments, or fluoride toothpaste. °· Eat a healthy diet. Reduce foods and drinks with added sugar. °· Avoid smoking. °TREATMENT FOR ORAL HEALTH PROBLEMS °If you have oral health problems, treatment varies depending on the conditions present in your teeth and gums. °· Your caregiver will most likely recommend good oral hygiene at each visit. °· For cavities, gingivitis, or other oral health disease, your caregiver will perform a procedure to treat the problem. This is typically done at a separate appointment. Sometimes your caregiver will refer you to another dental specialist for specific tooth problems or for surgery. °SEEK IMMEDIATE DENTAL CARE IF: °· You have pain, bleeding, or soreness in the gum, tooth, jaw, or mouth area. °· A permanent tooth becomes loose or separated from the gum socket. °· You experience a blow or injury to the mouth or jaw area. °Document Released: 11/08/2010 Document Revised: 05/21/2011 Document Reviewed: 11/08/2010 °ExitCare® Patient Information ©2014 [employer]. ° °Dental Pain °A tooth ache may be caused by cavities (tooth decay). Cavities expose the nerve of the tooth to air and hot or cold temperatures. It may come from an infection or abscess (also called a   boil or furuncle) around your tooth. It is also often caused by dental caries (tooth decay). This causes the pain you are having. DIAGNOSIS  Your caregiver can diagnose this problem by exam. TREATMENT   If caused by an infection, it may be treated with medications which kill germs (antibiotics) and pain  medications as prescribed by your caregiver. Take medications as directed.  Only take over-the-counter or prescription medicines for pain, discomfort, or fever as directed by your caregiver.  Whether the tooth ache today is caused by infection or dental disease, you should see your dentist as soon as possible for further care. SEEK MEDICAL CARE IF: The exam and treatment you received today has been provided on an emergency basis only. This is not a substitute for complete medical or dental care. If your problem worsens or new problems (symptoms) appear, and you are unable to meet with your dentist, call or return to this location. SEEK IMMEDIATE MEDICAL CARE IF:   You have a fever.  You develop redness and swelling of your face, jaw, or neck.  You are unable to open your mouth.  You have severe pain uncontrolled by pain medicine. MAKE SURE YOU:   Understand these instructions.  Will watch your condition.  Will get help right away if you are not doing well or get worse. Document Released: 02/26/2005 Document Revised: 05/21/2011 Document Reviewed: 10/15/2007 [facility] Patient Information 2014 [place], [place]. Temporomandibular Problems  Temporomandibular joint (TMJ) dysfunction means there are problems with the joint between your jaw and your skull. This is a joint lined by cartilage like other joints in your body but also has a small disc in the joint which keeps the bones from rubbing on each other. These joints are like other joints and can get inflamed (sore) from arthritis and other problems. When this joint gets sore, it can cause headaches and pain in the jaw and the face. CAUSES  Usually the arthritic types of problems are caused by soreness in the joint. Soreness in the joint can also be caused by overuse. This may come from grinding your teeth. It may also come from mis-alignment in the joint. DIAGNOSIS Diagnosis of this condition can often be made by history and exam. Sometimes  your caregiver may need X-rays or an MRI scan to determine the exact cause. It may be necessary to see your dentist to determine if your teeth and jaws are lined up correctly. TREATMENT  Most of the time this problem is not serious; however, sometimes it can persist (become chronic). When this happens medications that will cut down on inflammation (soreness) help. Sometimes a shot of cortisone into the joint will be helpful. If your teeth are not aligned it may help for your dentist to make a splint for your mouth that can help this problem. If no physical problems can be found, the problem may come from tension. If tension is found to be the cause, biofeedback or relaxation techniques may be helpful. HOME CARE INSTRUCTIONS   Later in the day, applications of ice packs may be helpful. Ice can be used in a plastic bag with a towel around it to prevent frostbite to skin. This may be used about every 2 hours for 20 to 30 minutes, as needed while awake, or as directed by your caregiver.  Only take over-the-counter or prescription medicines for pain, discomfort, or fever as directed by your caregiver.  If physical therapy was prescribed, follow your caregiver's directions.  Wear mouth appliances as directed if  they were given. Document Released: 11/21/2000 Document Revised: 05/21/2011 Document Reviewed: 02/29/2008 [facility] LPExitCare Patient Information 2014 [place]Care, [place].

## 2013-08-08 ENCOUNTER — Emergency Department (HOSPITAL_COMMUNITY)
Admission: EM | Admit: 2013-08-08 | Discharge: 2013-08-09 | Disposition: A | Discharge status: Home / Self Care | Payer: Self-pay | Attending: Emergency Medicine | Admitting: Emergency Medicine

## 2013-08-08 ENCOUNTER — Emergency Department (HOSPITAL_COMMUNITY): Payer: Medicaid Other

## 2013-08-08 ENCOUNTER — Encounter (HOSPITAL_COMMUNITY): Payer: Self-pay | Admitting: Emergency Medicine

## 2013-08-08 ENCOUNTER — Emergency Department (HOSPITAL_COMMUNITY): Payer: Self-pay

## 2013-08-08 DIAGNOSIS — Z3202 Encounter for pregnancy test, result negative: Secondary | ICD-10-CM | POA: Insufficient documentation

## 2013-08-08 DIAGNOSIS — Z9889 Other specified postprocedural states: Secondary | ICD-10-CM | POA: Insufficient documentation

## 2013-08-08 DIAGNOSIS — R42 Dizziness and giddiness: Secondary | ICD-10-CM | POA: Insufficient documentation

## 2013-08-08 DIAGNOSIS — R110 Nausea: Secondary | ICD-10-CM | POA: Insufficient documentation

## 2013-08-08 DIAGNOSIS — Z8619 Personal history of other infectious and parasitic diseases: Secondary | ICD-10-CM | POA: Insufficient documentation

## 2013-08-08 DIAGNOSIS — Z87440 Personal history of urinary (tract) infections: Secondary | ICD-10-CM | POA: Insufficient documentation

## 2013-08-08 DIAGNOSIS — Z79899 Other long term (current) drug therapy: Secondary | ICD-10-CM | POA: Insufficient documentation

## 2013-08-08 DIAGNOSIS — Z8632 Personal history of gestational diabetes: Secondary | ICD-10-CM | POA: Insufficient documentation

## 2013-08-08 DIAGNOSIS — R109 Unspecified abdominal pain: Secondary | ICD-10-CM

## 2013-08-08 DIAGNOSIS — R072 Precordial pain: Secondary | ICD-10-CM | POA: Insufficient documentation

## 2013-08-08 DIAGNOSIS — R079 Chest pain, unspecified: Secondary | ICD-10-CM

## 2013-08-08 DIAGNOSIS — R1031 Right lower quadrant pain: Secondary | ICD-10-CM | POA: Insufficient documentation

## 2013-08-08 DIAGNOSIS — Z8701 Personal history of pneumonia (recurrent): Secondary | ICD-10-CM | POA: Insufficient documentation

## 2013-08-08 DIAGNOSIS — R0602 Shortness of breath: Secondary | ICD-10-CM | POA: Insufficient documentation

## 2013-08-08 DIAGNOSIS — F411 Generalized anxiety disorder: Secondary | ICD-10-CM | POA: Insufficient documentation

## 2013-08-08 LAB — HEPATIC FUNCTION PANEL
ALBUMIN: 4 g/dL (ref 3.5–5.2)
ALT: 49 U/L — ABNORMAL HIGH (ref 0–35)
AST: 71 U/L — ABNORMAL HIGH (ref 0–37)
Alkaline Phosphatase: 157 U/L — ABNORMAL HIGH (ref 39–117)
BILIRUBIN TOTAL: 0.5 mg/dL (ref 0.3–1.2)
Bilirubin, Direct: <0.2 mg/dL (ref 0.0–0.3)
TOTAL PROTEIN: 7.4 g/dL (ref 6.0–8.3)

## 2013-08-08 LAB — CBC
HCT: 41.2 % (ref 36.0–46.0)
HEMOGLOBIN: 14.2 g/dL (ref 12.0–15.0)
MCH: 31.3 pg (ref 26.0–34.0)
MCHC: 34.5 g/dL (ref 30.0–36.0)
MCV: 90.7 fL (ref 78.0–100.0)
PLATELETS: 303 10*3/uL (ref 150–400)
RBC: 4.54 MIL/uL (ref 3.87–5.11)
RDW: 13.4 % (ref 11.5–15.5)
WBC: 9.8 10*3/uL (ref 4.0–10.5)

## 2013-08-08 LAB — BASIC METABOLIC PANEL
BUN: 10 mg/dL (ref 6–23)
CO2: 21 meq/L (ref 19–32)
Calcium: 9.5 mg/dL (ref 8.4–10.5)
Chloride: 99 mEq/L (ref 96–112)
Creatinine, Ser: 0.48 mg/dL — ABNORMAL LOW (ref 0.50–1.10)
GFR calc Af Amer: >90 mL/min (ref >90)
GLUCOSE: 82 mg/dL (ref 70–99)
POTASSIUM: 3.7 meq/L (ref 3.7–5.3)
Sodium: 137 mEq/L (ref 137–147)

## 2013-08-08 LAB — LIPASE, BLOOD: LIPASE: 37 U/L (ref 11–59)

## 2013-08-08 LAB — URINALYSIS, ROUTINE W REFLEX MICROSCOPIC
BILIRUBIN URINE: NEGATIVE
Glucose, UA: NEGATIVE mg/dL
Hgb urine dipstick: NEGATIVE
KETONES UR: NEGATIVE mg/dL
Leukocytes, UA: NEGATIVE
NITRITE: NEGATIVE
PH: 7.5 (ref 5.0–8.0)
Protein, ur: NEGATIVE mg/dL
Specific Gravity, Urine: 1.019 (ref 1.005–1.030)
UROBILINOGEN UA: 1 mg/dL (ref 0.0–1.0)

## 2013-08-08 LAB — I-STAT TROPONIN, ED: Troponin i, poc: 0 ng/mL (ref 0.00–0.08)

## 2013-08-08 LAB — PRO B NATRIURETIC PEPTIDE: PRO B NATRI PEPTIDE: 88.5 pg/mL (ref 0–125)

## 2013-08-08 LAB — PREGNANCY, URINE: Preg Test, Ur: NEGATIVE

## 2013-08-08 MED ORDER — SODIUM CHLORIDE 0.9 % IV BOLUS (SEPSIS)
250.0000 mL | Freq: Once | INTRAVENOUS | Status: AC
  Administered 2013-08-08: 250 mL via INTRAVENOUS

## 2013-08-08 MED ORDER — SODIUM CHLORIDE 0.9 % IV SOLN
INTRAVENOUS | Status: DC
  Administered 2013-08-08: 23:00:00 via INTRAVENOUS

## 2013-08-08 MED ORDER — HYDROCODONE-ACETAMINOPHEN 5-325 MG PO TABS: 1.0000 | ORAL_TABLET | Freq: Four times a day (QID) | ORAL | Status: DC | PRN

## 2013-08-08 MED ORDER — ONDANSETRON HCL 4 MG/2ML IJ SOLN
4.0000 mg | Freq: Once | INTRAMUSCULAR | Status: AC
  Administered 2013-08-08: 4 mg via INTRAVENOUS
  Filled 2013-08-08: qty 2

## 2013-08-08 MED ORDER — PROMETHAZINE HCL 25 MG PO TABS: 25.0000 mg | ORAL_TABLET | Freq: Four times a day (QID) | ORAL | Status: DC | PRN

## 2013-08-08 MED ORDER — IOHEXOL 300 MG/ML  SOLN
100.0000 mL | Freq: Once | INTRAMUSCULAR | Status: AC | PRN
  Administered 2013-08-08: 100 mL via INTRAVENOUS

## 2013-08-08 NOTE — ED Notes (Signed)
EDP at bedside  

## 2013-08-08 NOTE — ED Provider Notes (Signed)
CSN: [id number]     Arrival date & time 08/08/13  1950 History   First MD Initiated Contact with Patient 08/08/13 2000     Chief Complaint  Patient presents with  . Abdominal Pain  . Nausea  . Chest Pain  . Shortness of Breath     (Consider location/radiation/quality/duration/timing/severity/associated sxs/prior Treatment) Patient is a 28 y.o. female presenting with abdominal pain, chest pain, and shortness of breath. The history is provided by the patient.  Abdominal Pain Associated symptoms: chest pain, nausea and shortness of breath   Associated symptoms: no dysuria, no fever, no hematuria and no vomiting   Chest Pain Associated symptoms: abdominal pain, dizziness, nausea and shortness of breath   Associated symptoms: no back pain, no fever, no headache and not vomiting   Shortness of Breath Associated symptoms: abdominal pain and chest pain   Associated symptoms: no fever, no headaches, no rash and no vomiting    patient presenting with multiple complaints. Substernal chest pain and shortness of breath started at the 10:00 this morning along with some dizziness the component of vertigo. Patient said nausea to 3 weeks. Also patient also complained of abdominal pain both lower corner and sent some of the right quadrant. That started today. Patient had a history of gallbladder problems in the past. Patient's had a history of congestive heart failure in the past. Patient states his abdominal pain is about a 6/10. Patient was concerned about her congestive heart failure returning.  Past Medical History  Diagnosis Date  . Herpes genitalia   . HSV (herpes simplex virus) infection     last outbreak in may  . Mitral valve prolapse   . Status post repair of cor triatriatum     Echocardiogram 12/05: EF 55-65%, no mitral valve prolapse, mild RVE and no mention of any residual ASD.    Marland Kitchen Abnormal Pap smear   . Gestational diabetes   . Infection     UTI  . Anxiety     on meds  .  Healthcare-associated pneumonia 10/31/2012   Past Surgical History  Procedure Laterality Date  . Cardiac surgery      congenital heart defect repair at 9 mos  . Exploration post operative open heart    . Cesarean section N/A 10/06/2012    Procedure: Primary CESAREAN SECTION  of baby girl at 0347 APGAR 8/8;  Surgeon: Philip Aspen, DO;  Location: [facility];  Service: Obstetrics;  Laterality: N/A;  . Laparotomy N/A 10/07/2012    Procedure: EXPLORATORY LAPAROTOMY;  Surgeon: Freddrick March. Tenny Craw, MD;  Location: [facility];  Service: Gynecology;  Laterality: N/A;   Family History  Problem Relation Age of Onset  . Diabetes Mother   . Asthma Mother   . Diabetes Father   . Asthma Father   . Diabetes Maternal Grandmother   . Diabetes Paternal Grandfather    History  Substance Use Topics  . Smoking status: Former Games developer  . Smokeless tobacco: Never Used     Comment: quit 1st trimester  . Alcohol Use: Yes     Comment: occasional alcohol, not with preg   OB History   Grav Para Term Preterm Abortions TAB SAB Ect Mult Living   3 2 1 1 1  0 1 0 0 2     Review of Systems  Constitutional: Negative for fever.  HENT: Negative for congestion.   Eyes: Negative for visual disturbance.  Respiratory: Positive for shortness of breath.   Cardiovascular: Positive for chest pain.  Gastrointestinal:  Positive for nausea and abdominal pain. Negative for vomiting.  Genitourinary: Negative for dysuria and hematuria.  Musculoskeletal: Negative for back pain.  Skin: Negative for rash.  Neurological: Positive for dizziness. Negative for headaches.  Hematological: Does not bruise/bleed easily.  Psychiatric/Behavioral: Negative for confusion.      Allergies  Mucinex  Home Medications   Prior to Admission medications   Medication Sig Start Date End Date Taking? Authorizing Provider  acetaminophen (TYLENOL) 500 MG tablet Take 500 mg by mouth every 6 (six) hours as needed for mild pain.   Yes Historical Provider,  MD  cetirizine (ZYRTEC) 10 MG tablet Take 10 mg by mouth daily.   Yes Historical Provider, MD  FLUoxetine (PROZAC) 20 MG capsule Take 20 mg by mouth daily.   Yes Historical Provider, MD  Multiple Vitamin (MULTIVITAMIN WITH MINERALS) TABS tablet Take 1 tablet by mouth daily.   Yes Historical Provider, MD  valACYclovir (VALTREX) 500 MG tablet Take 500 mg by mouth daily as needed (for outbreak).    Yes Historical Provider, MD   BP 115/75  Pulse 67  Temp(Src) 98.5 F (36.9 C) (Oral)  Resp 14  SpO2 99% Physical Exam  Nursing note and vitals reviewed. Constitutional: She is oriented to person, place, and time. She appears well-developed and well-nourished. No distress.  HENT:  Head: Normocephalic and atraumatic.  Mouth/Throat: Oropharynx is clear and moist.  Eyes: Conjunctivae and EOM are normal. Pupils are equal, round, and reactive to light.  Neck: Normal range of motion.  Cardiovascular: Normal rate, regular rhythm and intact distal pulses.   Pulmonary/Chest: Effort normal and breath sounds normal. No respiratory distress.  Abdominal: Soft. Bowel sounds are normal. There is tenderness. There is no guarding.  Neurological: She is alert and oriented to person, place, and time. No cranial nerve deficit. She exhibits normal muscle tone. Coordination normal.  Skin: Skin is warm. No rash noted.    ED Course  Procedures (including critical care time) Labs Review Labs Reviewed  BASIC METABOLIC PANEL - Abnormal; Notable for the following:    Creatinine, Ser 0.48 (*)    All other components within normal limits  CBC  PRO B NATRIURETIC PEPTIDE  PREGNANCY, URINE  HEPATIC FUNCTION PANEL  LIPASE, BLOOD  URINALYSIS, ROUTINE W REFLEX MICROSCOPIC  I-STAT TROPOININ, ED   Results for orders placed during the hospital encounter of 08/08/13  CBC      Result Value Ref Range   WBC 9.8  4.0 - 10.5 K/uL   RBC 4.54  3.87 - 5.11 MIL/uL   Hemoglobin 14.2  12.0 - 15.0 g/dL   HCT 16.141.2  09.636.0 - 04.546.0 %    MCV 90.7  78.0 - 100.0 fL   MCH 31.3  26.0 - 34.0 pg   MCHC 34.5  30.0 - 36.0 g/dL   RDW 40.913.4  81.111.5 - 91.415.5 %   Platelets 303  150 - 400 K/uL  BASIC METABOLIC PANEL      Result Value Ref Range   Sodium 137  137 - 147 mEq/L   Potassium 3.7  3.7 - 5.3 mEq/L   Chloride 99  96 - 112 mEq/L   CO2 21  19 - 32 mEq/L   Glucose, Bld 82  70 - 99 mg/dL   BUN 10  6 - 23 mg/dL   Creatinine, Ser 7.820.48 (*) 0.50 - 1.10 mg/dL   Calcium 9.5  8.4 - 95.610.5 mg/dL   GFR calc non Af Amer >90  >90 mL/min   GFR  calc Af Amer >90  >90 mL/min  PRO B NATRIURETIC PEPTIDE      Result Value Ref Range   Pro B Natriuretic peptide (BNP) 88.5  0 - 125 pg/mL  I-STAT TROPOININ, ED      Result Value Ref Range   Troponin i, poc 0.00  0.00 - 0.08 ng/mL   Comment 3            Results for orders placed during the hospital encounter of 08/08/13  CBC      Result Value Ref Range   WBC 9.8  4.0 - 10.5 K/uL   RBC 4.54  3.87 - 5.11 MIL/uL   Hemoglobin 14.2  12.0 - 15.0 g/dL   HCT 16.1  09.6 - 04.5 %   MCV 90.7  78.0 - 100.0 fL   MCH 31.3  26.0 - 34.0 pg   MCHC 34.5  30.0 - 36.0 g/dL   RDW 40.9  81.1 - 91.4 %   Platelets 303  150 - 400 K/uL  BASIC METABOLIC PANEL      Result Value Ref Range   Sodium 137  137 - 147 mEq/L   Potassium 3.7  3.7 - 5.3 mEq/L   Chloride 99  96 - 112 mEq/L   CO2 21  19 - 32 mEq/L   Glucose, Bld 82  70 - 99 mg/dL   BUN 10  6 - 23 mg/dL   Creatinine, Ser 7.82 (*) 0.50 - 1.10 mg/dL   Calcium 9.5  8.4 - 95.6 mg/dL   GFR calc non Af Amer >90  >90 mL/min   GFR calc Af Amer >90  >90 mL/min  PRO B NATRIURETIC PEPTIDE      Result Value Ref Range   Pro B Natriuretic peptide (BNP) 88.5  0 - 125 pg/mL  PREGNANCY, URINE      Result Value Ref Range   Preg Test, Ur NEGATIVE  NEGATIVE  HEPATIC FUNCTION PANEL      Result Value Ref Range   Total Protein 7.4  6.0 - 8.3 g/dL   Albumin 4.0  3.5 - 5.2 g/dL   AST 71 (*) 0 - 37 U/L   ALT 49 (*) 0 - 35 U/L   Alkaline Phosphatase 157 (*) 39 - 117 U/L    Total Bilirubin 0.5  0.3 - 1.2 mg/dL   Bilirubin, Direct <2.1  0.0 - 0.3 mg/dL   Indirect Bilirubin NOT CALCULATED  0.3 - 0.9 mg/dL  LIPASE, BLOOD      Result Value Ref Range   Lipase 37  11 - 59 U/L  URINALYSIS, ROUTINE W REFLEX MICROSCOPIC      Result Value Ref Range   Color, Urine YELLOW  YELLOW   APPearance CLEAR  CLEAR   Specific Gravity, Urine 1.019  1.005 - 1.030   pH 7.5  5.0 - 8.0   Glucose, UA NEGATIVE  NEGATIVE mg/dL   Hgb urine dipstick NEGATIVE  NEGATIVE   Bilirubin Urine NEGATIVE  NEGATIVE   Ketones, ur NEGATIVE  NEGATIVE mg/dL   Protein, ur NEGATIVE  NEGATIVE mg/dL   Urobilinogen, UA 1.0  0.0 - 1.0 mg/dL   Nitrite NEGATIVE  NEGATIVE   Leukocytes, UA NEGATIVE  NEGATIVE  I-STAT TROPOININ, ED      Result Value Ref Range   Troponin i, poc 0.00  0.00 - 0.08 ng/mL   Comment 3            Results for orders placed during the hospital encounter of 08/08/13  CBC      Result Value Ref Range   WBC 9.8  4.0 - 10.5 K/uL   RBC 4.54  3.87 - 5.11 MIL/uL   Hemoglobin 14.2  12.0 - 15.0 g/dL   HCT 16.1  09.6 - 04.5 %   MCV 90.7  78.0 - 100.0 fL   MCH 31.3  26.0 - 34.0 pg   MCHC 34.5  30.0 - 36.0 g/dL   RDW 40.9  81.1 - 91.4 %   Platelets 303  150 - 400 K/uL  BASIC METABOLIC PANEL      Result Value Ref Range   Sodium 137  137 - 147 mEq/L   Potassium 3.7  3.7 - 5.3 mEq/L   Chloride 99  96 - 112 mEq/L   CO2 21  19 - 32 mEq/L   Glucose, Bld 82  70 - 99 mg/dL   BUN 10  6 - 23 mg/dL   Creatinine, Ser 7.82 (*) 0.50 - 1.10 mg/dL   Calcium 9.5  8.4 - 95.6 mg/dL   GFR calc non Af Amer >90  >90 mL/min   GFR calc Af Amer >90  >90 mL/min  PRO B NATRIURETIC PEPTIDE      Result Value Ref Range   Pro B Natriuretic peptide (BNP) 88.5  0 - 125 pg/mL  PREGNANCY, URINE      Result Value Ref Range   Preg Test, Ur NEGATIVE  NEGATIVE  HEPATIC FUNCTION PANEL      Result Value Ref Range   Total Protein 7.4  6.0 - 8.3 g/dL   Albumin 4.0  3.5 - 5.2 g/dL   AST 71 (*) 0 - 37 U/L   ALT 49  (*) 0 - 35 U/L   Alkaline Phosphatase 157 (*) 39 - 117 U/L   Total Bilirubin 0.5  0.3 - 1.2 mg/dL   Bilirubin, Direct <2.1  0.0 - 0.3 mg/dL   Indirect Bilirubin NOT CALCULATED  0.3 - 0.9 mg/dL  LIPASE, BLOOD      Result Value Ref Range   Lipase 37  11 - 59 U/L  URINALYSIS, ROUTINE W REFLEX MICROSCOPIC      Result Value Ref Range   Color, Urine YELLOW  YELLOW   APPearance CLEAR  CLEAR   Specific Gravity, Urine 1.019  1.005 - 1.030   pH 7.5  5.0 - 8.0   Glucose, UA NEGATIVE  NEGATIVE mg/dL   Hgb urine dipstick NEGATIVE  NEGATIVE   Bilirubin Urine NEGATIVE  NEGATIVE   Ketones, ur NEGATIVE  NEGATIVE mg/dL   Protein, ur NEGATIVE  NEGATIVE mg/dL   Urobilinogen, UA 1.0  0.0 - 1.0 mg/dL   Nitrite NEGATIVE  NEGATIVE   Leukocytes, UA NEGATIVE  NEGATIVE  I-STAT TROPOININ, ED      Result Value Ref Range   Troponin i, poc 0.00  0.00 - 0.08 ng/mL   Comment 3            Dg Chest 2 View  08/08/2013   CLINICAL DATA:  Abdominal pain, chest pain, shortness of breath  EXAM: CHEST  2 VIEW  COMPARISON:  11/01/2012  FINDINGS: Cardiomegaly and pulmonary venous prominence in this patient with history of cor triatriatum. Surgical clips overlaps the upper mediastinum.  No consolidation, edema, effusion, or pneumothorax. Pectus excavatum.  IMPRESSION: No active cardiopulmonary disease.   Electronically Signed   By: Tiburcio Pea M.D.   On: 08/08/2013 23:48   Ct Abdomen Pelvis W Contrast  08/08/2013   CLINICAL DATA:  Abdominal pain and nausea.  Shortness of breath  EXAM: CT ABDOMEN AND PELVIS WITH CONTRAST  TECHNIQUE: Multidetector CT imaging of the abdomen and pelvis was performed using the standard protocol following bolus administration of intravenous contrast.  CONTRAST:  OMNIPAQUE IOHEXOL 300 MG/ML  SOLN  COMPARISON:  10/07/2012  FINDINGS: BODY WALL: Unremarkable.  LOWER CHEST: Mild pectus excavatum slightly deforming the right atrial contour and accentuating heart size.  ABDOMEN/PELVIS:  Liver: No  focal abnormality.  Biliary: No evidence of biliary obstruction or stone.  Pancreas: Unremarkable.  Spleen: Unremarkable.  Adrenals: Unremarkable.  Kidneys and ureters: No hydronephrosis or stone.  Bladder: There is suggestion of mild bladder wall thickening, but noted urinalysis.  Reproductive: IUD which is normally positioned. Dominant follicle on the left.  Bowel: No obstruction. Normal appendix.  Retroperitoneum: No mass or adenopathy.  Peritoneum: No free fluid or gas.  Vascular: No acute abnormality.  OSSEOUS: No acute abnormalities. Mild convincing osteitis around the SI joints.  IMPRESSION: 1. No acute intra-abdominal abnormality. 2. Left nephrolithiasis.   Electronically Signed   By: Tiburcio Pea M.D.   On: 08/08/2013 23:38      Imaging Review No results found.   EKG Interpretation   Date/Time:  Saturday Aug 08 2013 19:55:00 EDT Ventricular Rate:  78 PR Interval:  122 QRS Duration: 84 QT Interval:  380 QTC Calculation: 433 R Axis:   102 Text Interpretation:  Normal sinus rhythm Rightward axis Nonspecific T  wave abnormality Abnormal ECG Confirmed by Blanca Carreon  MD, Yazan Gatling ([id number]) on  08/08/2013 10:55:44 PM      MDM   Final diagnoses:  None    Patient with multiple complaints. Including midsternal chest pain shortness of breath component of some mild vertigo nausea and bilateral lower quadrant abdominal pain.  Have ordered CT of the abdomen and pelvis with contrast to evaluate the abdominal pain it is both lower quadrants does include right upper quadrant as well. Patient states she's had a history of gallbladder problems in the past. Chest x-ray is ordered to rule out the pneumonia pneumothorax. Patient's first troponin is negative EKG without acute changes. No leukocytosis. No electrolyte abnormalities. Hepatic enzymes and lipase are pending.  CT and chest x-ray pending however patient's BNP is not elevated so unlikely to be significant CHF. CT is being ordered to evaluate  the bilateral lower quadrant abdominal pain as well as a right upper quadrant pain. Rule out gallbladder disease. Patient has a urinalysis is pending along with pregnancy test. Before CT can be done.  Chest is negative urinalysis negative for urinary tract infection. Lipase is not elevated no evidence of pancreatitis based on the labs. Some mild mild liver function test abnormalities. Alkaline phosphatase is elevated to be increasing CBCT says about the bladder.   CT scan of abdomen without any abnormalities. Gallbladder is normal. Chest x-ray negative. Not concerned about pulmonary embolism due to the fact that the symptoms include abdomen and chest and as well some shortness of breath. Patient not tachycardic. Will discharge home with pain medicine and antinausea medicine.  Vanetta Mulders, MD 08/08/13 [phone]

## 2013-08-08 NOTE — Discharge Instructions (Signed)
Workup without any acute findings. Take pain medicine antinausea medicine as needed. Return for any newer worse symptoms or not improved in a couple days.

## 2013-08-08 NOTE — ED Notes (Signed)
Pt presents with midsternum chest pain, SOB, dizziness, SOB and nausea starting this am.

## 2013-08-08 NOTE — ED Notes (Signed)
Pt mother out at desk asking about delay- this RN at bedside to apologize and explain delay.  Family and patient calm and understanding.

## 2013-08-08 NOTE — ED Notes (Signed)
CT aware that patient finished PO CT contrast

## 2013-10-10 ENCOUNTER — Encounter (HOSPITAL_COMMUNITY): Payer: Self-pay | Admitting: Emergency Medicine

## 2013-10-10 ENCOUNTER — Emergency Department (HOSPITAL_COMMUNITY)
Admission: EM | Admit: 2013-10-10 | Discharge: 2013-10-10 | Disposition: A | Discharge status: Home / Self Care | Payer: Medicaid Other | Attending: Emergency Medicine | Admitting: Emergency Medicine

## 2013-10-10 DIAGNOSIS — Z8619 Personal history of other infectious and parasitic diseases: Secondary | ICD-10-CM | POA: Insufficient documentation

## 2013-10-10 DIAGNOSIS — Z8632 Personal history of gestational diabetes: Secondary | ICD-10-CM | POA: Insufficient documentation

## 2013-10-10 DIAGNOSIS — F411 Generalized anxiety disorder: Secondary | ICD-10-CM | POA: Insufficient documentation

## 2013-10-10 DIAGNOSIS — R300 Dysuria: Secondary | ICD-10-CM | POA: Insufficient documentation

## 2013-10-10 DIAGNOSIS — Z87891 Personal history of nicotine dependence: Secondary | ICD-10-CM | POA: Insufficient documentation

## 2013-10-10 DIAGNOSIS — Z8701 Personal history of pneumonia (recurrent): Secondary | ICD-10-CM | POA: Insufficient documentation

## 2013-10-10 DIAGNOSIS — Z8679 Personal history of other diseases of the circulatory system: Secondary | ICD-10-CM | POA: Insufficient documentation

## 2013-10-10 DIAGNOSIS — N390 Urinary tract infection, site not specified: Secondary | ICD-10-CM | POA: Insufficient documentation

## 2013-10-10 DIAGNOSIS — Z79899 Other long term (current) drug therapy: Secondary | ICD-10-CM | POA: Insufficient documentation

## 2013-10-10 DIAGNOSIS — Z3202 Encounter for pregnancy test, result negative: Secondary | ICD-10-CM | POA: Insufficient documentation

## 2013-10-10 LAB — URINALYSIS, ROUTINE W REFLEX MICROSCOPIC
BILIRUBIN URINE: NEGATIVE
Glucose, UA: NEGATIVE mg/dL
KETONES UR: NEGATIVE mg/dL
NITRITE: NEGATIVE
PROTEIN: NEGATIVE mg/dL
Specific Gravity, Urine: 1.019 (ref 1.005–1.030)
UROBILINOGEN UA: 1 mg/dL (ref 0.0–1.0)
pH: 7.5 (ref 5.0–8.0)

## 2013-10-10 LAB — URINE MICROSCOPIC-ADD ON

## 2013-10-10 LAB — PREGNANCY, URINE: Preg Test, Ur: NEGATIVE

## 2013-10-10 MED ORDER — PHENAZOPYRIDINE HCL 100 MG PO TABS
100.0000 mg | ORAL_TABLET | Freq: Once | ORAL | Status: AC
  Administered 2013-10-10: 100 mg via ORAL
  Filled 2013-10-10: qty 1

## 2013-10-10 MED ORDER — NITROFURANTOIN MONOHYD MACRO 100 MG PO CAPS
100.0000 mg | ORAL_CAPSULE | Freq: Once | ORAL | Status: AC
  Administered 2013-10-10: 100 mg via ORAL
  Filled 2013-10-10: qty 1

## 2013-10-10 MED ORDER — NITROFURANTOIN MONOHYD MACRO 100 MG PO CAPS: 100.0000 mg | ORAL_CAPSULE | Freq: Two times a day (BID) | ORAL | Status: DC

## 2013-10-10 MED ORDER — PHENAZOPYRIDINE HCL 200 MG PO TABS
200.0000 mg | ORAL_TABLET | Freq: Three times a day (TID) | ORAL | Status: DC
Start: 2013-10-10 — End: 2013-11-26

## 2013-10-10 NOTE — ED Notes (Signed)
Pt presents to department for evaluation of dysuria and lower abdominal pain, 5/10 pain upon arrival. Denies vaginal symptoms. Pt is alert and oriented x4.

## 2013-10-10 NOTE — ED Provider Notes (Signed)
CSN: [id number]     Arrival date & time 10/10/13  1128 History   First MD Initiated Contact with Patient 10/10/13 1135     Chief Complaint  Patient presents with  . Urinary Tract Infection     HPI  Presents with suprapubic abdominal pain. Is been present for 2 or 3 days. This morning as dysuria with frequency. No hematuria. No discharge. Last was her period was 2 weeks ago. She states it was normal for her. However, she has a Mirena IUD in is very irregular at baseline. Prior to her last "." She took "5 pregnancy test". States 3 were negative, 2 were positive. Has not had a pregnancy test the last 2 weeks. Does not have breast tenderness or nausea.  Past Medical History  Diagnosis Date  . Herpes genitalia   . HSV (herpes simplex virus) infection     last outbreak in may  . Mitral valve prolapse   . Status post repair of cor triatriatum     Echocardiogram 12/05: EF 55-65%, no mitral valve prolapse, mild RVE and no mention of any residual ASD.    Marland Kitchen. Abnormal Pap smear   . Gestational diabetes   . Infection     UTI  . Anxiety     on meds  . Healthcare-associated pneumonia 10/31/2012   Past Surgical History  Procedure Laterality Date  . Cardiac surgery      congenital heart defect repair at 9 mos  . Exploration post operative open heart    . Cesarean section N/A 10/06/2012    Procedure: Primary CESAREAN SECTION  of baby girl at 0347 APGAR 8/8;  Surgeon: Philip AspenSidney Callahan, DO;  Location: [facility];  Service: Obstetrics;  Laterality: N/A;  . Laparotomy N/A 10/07/2012    Procedure: EXPLORATORY LAPAROTOMY;  Surgeon: Freddrick MarchKendra H. Tenny Crawoss, MD;  Location: [facility];  Service: Gynecology;  Laterality: N/A;   Family History  Problem Relation Age of Onset  . Diabetes Mother   . Asthma Mother   . Diabetes Father   . Asthma Father   . Diabetes Maternal Grandmother   . Diabetes Paternal Grandfather    History  Substance Use Topics  . Smoking status: Former Games developermoker  . Smokeless tobacco: Never Used   Comment: quit 1st trimester  . Alcohol Use: Yes     Comment: social   OB History   Grav Para Term Preterm Abortions TAB SAB Ect Mult Living   3 2 1 1 1  0 1 0 0 2     Review of Systems  Constitutional: Negative for fever, chills, diaphoresis, appetite change and fatigue.  HENT: Negative for mouth sores, sore throat and trouble swallowing.   Eyes: Negative for visual disturbance.  Respiratory: Negative for cough, chest tightness, shortness of breath and wheezing.   Cardiovascular: Negative for chest pain.  Gastrointestinal: Positive for abdominal pain. Negative for nausea, vomiting, diarrhea and abdominal distention.  Endocrine: Negative for polydipsia, polyphagia and polyuria.  Genitourinary: Positive for dysuria and frequency. Negative for hematuria.  Musculoskeletal: Negative for gait problem.  Skin: Negative for color change, pallor and rash.  Neurological: Negative for dizziness, syncope, light-headedness and headaches.  Hematological: Does not bruise/bleed easily.  Psychiatric/Behavioral: Negative for behavioral problems and confusion.      Allergies  Mucinex  Home Medications   Prior to Admission medications   Medication Sig Start Date End Date Taking? Authorizing Provider  FLUoxetine (PROZAC) 20 MG capsule Take 20 mg by mouth daily.   Yes Historical Provider, MD  furosemide (LASIX) 20 MG tablet Take 10 mg by mouth daily.   Yes Historical Provider, MD  lisinopril (PRINIVIL,ZESTRIL) 5 MG tablet Take 5 mg by mouth daily.   Yes Historical Provider, MD  metoprolol tartrate (LOPRESSOR) 25 MG tablet Take 25 mg by mouth 2 (two) times daily.   Yes Historical Provider, MD  Multiple Vitamin (MULTIVITAMIN WITH MINERALS) TABS tablet Take 1 tablet by mouth daily.   Yes Historical Provider, MD  nitrofurantoin, macrocrystal-monohydrate, (MACROBID) 100 MG capsule Take 1 capsule (100 mg total) by mouth 2 (two) times daily. 10/10/13   Rolland Porter, MD  phenazopyridine (PYRIDIUM) 200 MG tablet  Take 1 tablet (200 mg total) by mouth 3 (three) times daily. 10/10/13   Rolland Porter, MD   BP 109/50  Pulse 56  Temp(Src) 98 F (36.7 C) (Oral)  Resp 18  SpO2 96% Physical Exam  Constitutional: She is oriented to person, place, and time. She appears well-developed and well-nourished. No distress.  HENT:  Head: Normocephalic.  Eyes: Conjunctivae are normal. Pupils are equal, round, and reactive to light. No scleral icterus.  Neck: Normal range of motion. Neck supple. No thyromegaly present.  Cardiovascular: Normal rate and regular rhythm.  Exam reveals no gallop and no friction rub.   No murmur heard. Pulmonary/Chest: Effort normal and breath sounds normal. No respiratory distress. She has no wheezes. She has no rales.  Abdominal: Soft. Bowel sounds are normal. She exhibits no distension. There is no tenderness. There is no rebound.    Musculoskeletal: Normal range of motion.  Neurological: She is alert and oriented to person, place, and time.  Skin: Skin is warm and dry. No rash noted.  Psychiatric: She has a normal mood and affect. Her behavior is normal.    ED Course  Procedures (including critical care time) Labs Review Labs Reviewed  URINALYSIS, ROUTINE W REFLEX MICROSCOPIC - Abnormal; Notable for the following:    APPearance CLOUDY (*)    Hgb urine dipstick LARGE (*)    Leukocytes, UA LARGE (*)    All other components within normal limits  URINE MICROSCOPIC-ADD ON - Abnormal; Notable for the following:    Squamous Epithelial / LPF FEW (*)    All other components within normal limits  PREGNANCY, URINE    Imaging Review No results found.   EKG Interpretation None      MDM   Final diagnoses:  UTI (lower urinary tract infection)    UA suggest urinary tract infection. Not pregnant. No concerns of vaginitis or PID. Plan is treatment for urinary tract infection. Macrobid, Pyridium.    Rolland Porter, MD 10/10/13 1256

## 2013-10-10 NOTE — Discharge Instructions (Signed)

## 2013-10-14 ENCOUNTER — Emergency Department (HOSPITAL_COMMUNITY)
Admission: EM | Admit: 2013-10-14 | Discharge: 2013-10-15 | Disposition: A | Discharge status: Home / Self Care | Payer: Medicaid Other | Attending: Emergency Medicine | Admitting: Emergency Medicine

## 2013-10-14 ENCOUNTER — Encounter (HOSPITAL_COMMUNITY): Payer: Self-pay | Admitting: Emergency Medicine

## 2013-10-14 DIAGNOSIS — Z87440 Personal history of urinary (tract) infections: Secondary | ICD-10-CM | POA: Insufficient documentation

## 2013-10-14 DIAGNOSIS — Z8701 Personal history of pneumonia (recurrent): Secondary | ICD-10-CM | POA: Insufficient documentation

## 2013-10-14 DIAGNOSIS — Z8632 Personal history of gestational diabetes: Secondary | ICD-10-CM | POA: Insufficient documentation

## 2013-10-14 DIAGNOSIS — R55 Syncope and collapse: Secondary | ICD-10-CM | POA: Insufficient documentation

## 2013-10-14 DIAGNOSIS — Z87891 Personal history of nicotine dependence: Secondary | ICD-10-CM | POA: Insufficient documentation

## 2013-10-14 DIAGNOSIS — Z9889 Other specified postprocedural states: Secondary | ICD-10-CM | POA: Insufficient documentation

## 2013-10-14 DIAGNOSIS — I059 Rheumatic mitral valve disease, unspecified: Secondary | ICD-10-CM | POA: Insufficient documentation

## 2013-10-14 DIAGNOSIS — F411 Generalized anxiety disorder: Secondary | ICD-10-CM | POA: Insufficient documentation

## 2013-10-14 DIAGNOSIS — Z8619 Personal history of other infectious and parasitic diseases: Secondary | ICD-10-CM | POA: Insufficient documentation

## 2013-10-14 DIAGNOSIS — Z79899 Other long term (current) drug therapy: Secondary | ICD-10-CM | POA: Insufficient documentation

## 2013-10-14 DIAGNOSIS — R404 Transient alteration of awareness: Secondary | ICD-10-CM | POA: Insufficient documentation

## 2013-10-14 DIAGNOSIS — R1031 Right lower quadrant pain: Secondary | ICD-10-CM | POA: Insufficient documentation

## 2013-10-14 LAB — COMPREHENSIVE METABOLIC PANEL
ALT: 23 U/L (ref 0–35)
AST: 24 U/L (ref 0–37)
Albumin: 4.1 g/dL (ref 3.5–5.2)
Alkaline Phosphatase: 142 U/L — ABNORMAL HIGH (ref 39–117)
Anion gap: 14 (ref 5–15)
BUN: 11 mg/dL (ref 6–23)
CO2: 26 meq/L (ref 19–32)
CREATININE: 0.5 mg/dL (ref 0.50–1.10)
Calcium: 9.6 mg/dL (ref 8.4–10.5)
Chloride: 100 mEq/L (ref 96–112)
GFR calc Af Amer: >90 mL/min (ref >90)
Glucose, Bld: 89 mg/dL (ref 70–99)
Potassium: 3.8 mEq/L (ref 3.7–5.3)
Sodium: 140 mEq/L (ref 137–147)
Total Bilirubin: 0.6 mg/dL (ref 0.3–1.2)
Total Protein: 7.6 g/dL (ref 6.0–8.3)

## 2013-10-14 LAB — CBC WITH DIFFERENTIAL/PLATELET
Basophils Absolute: 0 10*3/uL (ref 0.0–0.1)
Basophils Relative: 0 % (ref 0–1)
EOS PCT: 1 % (ref 0–5)
Eosinophils Absolute: 0.1 10*3/uL (ref 0.0–0.7)
HEMATOCRIT: 45.6 % (ref 36.0–46.0)
HEMOGLOBIN: 15.5 g/dL — AB (ref 12.0–15.0)
LYMPHS ABS: 1.4 10*3/uL (ref 0.7–4.0)
LYMPHS PCT: 15 % (ref 12–46)
MCH: 31.1 pg (ref 26.0–34.0)
MCHC: 34 g/dL (ref 30.0–36.0)
MCV: 91.6 fL (ref 78.0–100.0)
MONO ABS: 1.2 10*3/uL — AB (ref 0.1–1.0)
MONOS PCT: 13 % — AB (ref 3–12)
NEUTROS ABS: 6.8 10*3/uL (ref 1.7–7.7)
Neutrophils Relative %: 71 % (ref 43–77)
Platelets: 271 10*3/uL (ref 150–400)
RBC: 4.98 MIL/uL (ref 3.87–5.11)
RDW: 13.4 % (ref 11.5–15.5)
WBC: 9.7 10*3/uL (ref 4.0–10.5)

## 2013-10-14 MED ORDER — ONDANSETRON 4 MG PO TBDP
8.0000 mg | ORAL_TABLET | Freq: Once | ORAL | Status: AC
  Administered 2013-10-14: 8 mg via ORAL
  Filled 2013-10-14: qty 2

## 2013-10-14 NOTE — ED Notes (Signed)
Pt actively vomiting.

## 2013-10-14 NOTE — ED Notes (Signed)
Pt. stated she " overheated" and passed out with emesis and hallucinations onset this afternoon . Alert and oriented at arrival , respirations unlabored , denies pain , mild nausea , denies suicidal ideation .

## 2013-10-14 NOTE — ED Notes (Signed)
Pt ambulated to restroom with steady gait, NAD, reports slight dizziness but "I've been feeling like this all day."

## 2013-10-15 ENCOUNTER — Other Ambulatory Visit: Payer: Self-pay

## 2013-10-15 LAB — URINALYSIS, ROUTINE W REFLEX MICROSCOPIC
Bilirubin Urine: NEGATIVE
GLUCOSE, UA: NEGATIVE mg/dL
Hgb urine dipstick: NEGATIVE
Ketones, ur: NEGATIVE mg/dL
Nitrite: NEGATIVE
PROTEIN: NEGATIVE mg/dL
Specific Gravity, Urine: 1.02 (ref 1.005–1.030)
UROBILINOGEN UA: 1 mg/dL (ref 0.0–1.0)
pH: 8 (ref 5.0–8.0)

## 2013-10-15 LAB — URINE MICROSCOPIC-ADD ON

## 2013-10-15 LAB — PREGNANCY, URINE: PREG TEST UR: NEGATIVE

## 2013-10-15 NOTE — ED Provider Notes (Signed)
Medical screening examination/treatment/procedure(s) were conducted as a shared visit with non-physician practitioner(s) and myself.  I personally evaluated the patient during the encounter  Please see my separate respective documentation pertaining to this patient encounter   Vida RollerBrian D Aliyanna Wassmer, MD 10/15/13 [phone]

## 2013-10-15 NOTE — ED Provider Notes (Signed)
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 She states that she is in fairly well since that time has had a couple episodes of getting lightheaded and seeing spots when she stands up too quickly. She denies any other symptoms including fever, chills, cough, shortness of breath, back pain, diarrhea, rashes. She did have increased swelling this morning but this has since gone down, she denies chest pain. On exam the patient has a soft abdomen, clear heart and lung sounds without JVD, peripheral edema or pulmonary edema. Her neurologic exam is unremarkable with normal speech, cranial nerves III through XII intact, normal strength of all 4 extremities, normal coordination.  Laboratory workup unremarkable  EKG shows   ED ECG REPORT  I personally interpreted this EKG   Date: 10/15/2013   Rate: 64  Rhythm: normal sinus rhythm  QRS Axis: normal  Intervals: normal  ST/T Wave abnormalities: normal  Conduction Disutrbances:none  Narrative Interpretation:   Old EKG Reviewed: none available   Medical screening examination/treatment/procedure(s) were conducted as a shared visit with non-physician practitioner(s) and myself.  I personally evaluated the patient during the encounter.  Clinical Impression: Syncope      Vida RollerBrian D Daneil Beem, MD 10/15/13 [phone]

## 2013-10-15 NOTE — Discharge Instructions (Signed)
Near-Syncope Near-syncope (commonly known as near fainting) is sudden weakness, dizziness, or feeling like you might pass out. During an episode of near-syncope, you may also develop pale skin, have tunnel vision, or feel sick to your stomach (nauseous). Near-syncope may occur when getting up after sitting or while standing for a long time. It is caused by a sudden decrease in blood flow to the brain. This decrease can result from various causes or triggers, most of which are not serious. However, because near-syncope can sometimes be a sign of something serious, a medical evaluation is required. The specific cause is often not determined. HOME CARE INSTRUCTIONS  Monitor your condition for any changes. The following actions may help to alleviate any discomfort you are experiencing:  Have someone stay with you until you feel stable.  Lie down right away and prop your feet up if you start feeling like you might faint. Breathe deeply and steadily. Wait until all the symptoms have passed. Most of these episodes last only a few minutes. You may feel tired for several hours.   Drink enough fluids to keep your urine clear or pale yellow.   If you are taking blood pressure or heart medicine, get up slowly when seated or lying down. Take several minutes to sit and then stand. This can reduce dizziness.  Follow up with your health care provider as directed. SEEK IMMEDIATE MEDICAL CARE IF:   You have a severe headache.   You have unusual pain in the chest, abdomen, or back.   You are bleeding from the mouth or rectum, or you have black or tarry stool.   You have an irregular or very fast heartbeat.   You have repeated fainting or have seizure-like jerking during an episode.   You faint when sitting or lying down.   You have confusion.   You have difficulty walking.   You have severe weakness.   You have vision problems.  MAKE SURE YOU:   Understand these instructions.  Will  watch your condition.  Will get help right away if you are not doing well or get worse. Document Released: 02/26/2005 Document Revised: 03/03/2013 Document Reviewed: 08/01/2012 [facility] Patient Information 2015 [place], [place]. This information is not intended to replace advice given to you by your health care provider. Make sure you discuss any questions you have with your health care provider.   If your symptoms get worse and you begin to experience syncope more often, return to ED for further evaluation. Education provided on maintaining proper hydration.

## 2013-10-15 NOTE — ED Provider Notes (Signed)
CSN: [id number]     Arrival date & time 10/14/13  1919 History   First MD Initiated Contact with Patient 10/14/13 2344     Chief Complaint  Patient presents with  . Loss of Consciousness     (Consider location/radiation/quality/duration/timing/severity/associated sxs/prior Treatment) HPI [name] is a 28 y.o. 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 She says that at 4pm she was looking for her keys outside in her car and it was very hot and she passed out. She said her grandmother witnessed the event-she is not present to give description. Pt says she may have been "out for 4-5 minutes". She denies having hit her head or sustained any other trauma during the event. She said this has happened before, but it was during her pregnancy and she had a fever. She reports not having had any fluids today or eating very much. She reports HA prior to event that has now resolved.  She reports having similar episodes in the past when she stood up to quickly and felt light headed. She denies any fever, chills, palpitations, chest pain, SOB, , no swelling in her extremities, numbness or weakness. She admits to smoking 2-3 cigarettes a day as well as  "A few beers /week"   She does report having RLQ abd pain, but has had this abdominal pain "for years". Past Medical History  Diagnosis Date  . Herpes genitalia   . HSV (herpes simplex virus) infection     last outbreak in may  . Mitral valve prolapse   . Status post repair of cor triatriatum     Echocardiogram 12/05: EF 55-65%, no mitral valve prolapse, mild RVE and no mention of any residual ASD.    Marland Kitchen Abnormal Pap smear   . Gestational diabetes   . Infection     UTI  . Anxiety     on meds  . Healthcare-associated pneumonia 10/31/2012   Past Surgical History  Procedure Laterality Date  . Cardiac surgery      congenital heart defect repair at 9 mos  . Exploration post operative open heart    . Cesarean  section N/A 10/06/2012    Procedure: Primary CESAREAN SECTION  of baby girl at 0347 APGAR 8/8;  Surgeon: Philip Aspen, DO;  Location: [facility];  Service: Obstetrics;  Laterality: N/A;  . Laparotomy N/A 10/07/2012    Procedure: EXPLORATORY LAPAROTOMY;  Surgeon: Freddrick March. Tenny Craw, MD;  Location: [facility];  Service: Gynecology;  Laterality: N/A;   Family History  Problem Relation Age of Onset  . Diabetes Mother   . Asthma Mother   . Diabetes Father   . Asthma Father   . Diabetes Maternal Grandmother   . Diabetes Paternal Grandfather    History  Substance Use Topics  . Smoking status: Former Games developer  . Smokeless tobacco: Never Used     Comment: quit 1st trimester  . Alcohol Use: Yes     Comment: social   OB History   Grav Para Term Preterm Abortions TAB SAB Ect Mult Living   3 2 1 1 1  0 1 0 0 2     Review of Systems  All other systems reviewed and are negative.     Allergies  Mucinex  Home Medications   Prior to Admission medications   Medication Sig Start Date End Date Taking? Authorizing Provider  cetirizine (ZYRTEC) 10 MG tablet Take 10 mg by mouth daily as needed for allergies.   Yes Historical Provider, MD  FLUoxetine (PROZAC) 20 MG capsule Take 20 mg by mouth daily.   Yes Historical Provider, MD  furosemide (LASIX) 20 MG tablet Take 10 mg by mouth daily.   Yes Historical Provider, MD  lisinopril (PRINIVIL,ZESTRIL) 5 MG tablet Take 5 mg by mouth daily.   Yes Historical Provider, MD  Multiple Vitamin (MULTIVITAMIN WITH MINERALS) TABS tablet Take 1 tablet by mouth daily.   Yes Historical Provider, MD  nitrofurantoin, macrocrystal-monohydrate, (MACROBID) 100 MG capsule Take 100 mg by mouth 2 (two) times daily. 10/10/13 10/15/13 Yes Historical Provider, MD  phenazopyridine (PYRIDIUM) 200 MG tablet Take 1 tablet (200 mg total) by mouth 3 (three) times daily. 10/10/13   Rolland PorterMark James, MD   BP 102/70  Pulse 66  Temp(Src) 98.4 F (36.9 C) (Oral)  Resp 19  Ht 4\' 9"  (1.448 m)  Wt 177 lb  (80.287 kg)  BMI 38.29 kg/m2  SpO2 98%  LMP 09/29/2013 Physical Exam  Nursing note and vitals reviewed. Constitutional: She appears well-developed and well-nourished.  Awake, alert, nontoxic appearance.  HENT:  Head: Atraumatic.  Eyes: Right eye exhibits no discharge. Left eye exhibits no discharge.  Neck: Neck supple.  Cardiovascular: Normal rate, regular rhythm and normal heart sounds.   Pulmonary/Chest: Effort normal. She exhibits no tenderness.  Abdominal: Soft. There is no rebound.  Mild tenderness in RLQ which pt says she has had for years  Musculoskeletal: She exhibits no tenderness.  Baseline ROM, no obvious new focal weakness.  Neurological:  Mental status and motor strength appears baseline for patient and situation.  Skin: No rash noted.  Psychiatric: She has a normal mood and affect.    ED Course  Procedures (including critical care time) Labs Review Labs Reviewed  CBC WITH DIFFERENTIAL - Abnormal; Notable for the following:    Hemoglobin 15.5 (*)    Monocytes Relative 13 (*)    Monocytes Absolute 1.2 (*)    All other components within normal limits  COMPREHENSIVE METABOLIC PANEL - Abnormal; Notable for the following:    Alkaline Phosphatase 142 (*)    All other components within normal limits  URINALYSIS, ROUTINE W REFLEX MICROSCOPIC - Abnormal; Notable for the following:    APPearance TURBID (*)    Leukocytes, UA SMALL (*)    All other components within normal limits  URINE MICROSCOPIC-ADD ON - Abnormal; Notable for the following:    Squamous Epithelial / LPF FEW (*)    Bacteria, UA MANY (*)    All other components within normal limits  PREGNANCY, URINE    Imaging Review No results found.   EKG Interpretation None      MDM  Pt says she feels well since arriving in ED and is at her baseline Vitals stable Labwork essentially normal EKG reassuring. Pt reports several near syncopal events in the past due to standing up too quickly. PT will be DC  with instruction on maintaining hydration. Plan discussed with pt and she was very receptive.  Final diagnoses:  Syncope, unspecified syncope type   Meds given in ED:  Medications  ondansetron (ZOFRAN-ODT) disintegrating tablet 8 mg (8 mg Oral Given 10/14/13 2005)    Discharge Medication List as of 10/15/2013  2:04 AM         Sharlene MottsBenjamin W Eddye Broxterman, PA-C 10/15/13 [id number]

## 2013-11-26 ENCOUNTER — Encounter (HOSPITAL_COMMUNITY): Payer: Self-pay | Admitting: Emergency Medicine

## 2013-11-26 DIAGNOSIS — R109 Unspecified abdominal pain: Secondary | ICD-10-CM | POA: Insufficient documentation

## 2013-11-26 DIAGNOSIS — Z8701 Personal history of pneumonia (recurrent): Secondary | ICD-10-CM | POA: Insufficient documentation

## 2013-11-26 DIAGNOSIS — Z8632 Personal history of gestational diabetes: Secondary | ICD-10-CM | POA: Insufficient documentation

## 2013-11-26 DIAGNOSIS — Z87891 Personal history of nicotine dependence: Secondary | ICD-10-CM | POA: Insufficient documentation

## 2013-11-26 DIAGNOSIS — F411 Generalized anxiety disorder: Secondary | ICD-10-CM | POA: Insufficient documentation

## 2013-11-26 DIAGNOSIS — Z8679 Personal history of other diseases of the circulatory system: Secondary | ICD-10-CM | POA: Insufficient documentation

## 2013-11-26 DIAGNOSIS — Z8619 Personal history of other infectious and parasitic diseases: Secondary | ICD-10-CM | POA: Insufficient documentation

## 2013-11-26 DIAGNOSIS — Z8774 Personal history of (corrected) congenital malformations of heart and circulatory system: Secondary | ICD-10-CM | POA: Insufficient documentation

## 2013-11-26 DIAGNOSIS — K2990 Gastroduodenitis, unspecified, without bleeding: Principal | ICD-10-CM

## 2013-11-26 DIAGNOSIS — K2970 Gastritis, unspecified, without bleeding: Secondary | ICD-10-CM | POA: Insufficient documentation

## 2013-11-26 DIAGNOSIS — Z79899 Other long term (current) drug therapy: Secondary | ICD-10-CM | POA: Insufficient documentation

## 2013-11-26 DIAGNOSIS — N898 Other specified noninflammatory disorders of vagina: Secondary | ICD-10-CM | POA: Insufficient documentation

## 2013-11-26 DIAGNOSIS — Z3202 Encounter for pregnancy test, result negative: Secondary | ICD-10-CM | POA: Insufficient documentation

## 2013-11-26 DIAGNOSIS — Z87440 Personal history of urinary (tract) infections: Secondary | ICD-10-CM | POA: Insufficient documentation

## 2013-11-26 LAB — URINE MICROSCOPIC-ADD ON

## 2013-11-26 LAB — COMPREHENSIVE METABOLIC PANEL
ALBUMIN: 3.8 g/dL (ref 3.5–5.2)
ALT: 39 U/L — ABNORMAL HIGH (ref 0–35)
ANION GAP: 15 (ref 5–15)
AST: 42 U/L — ABNORMAL HIGH (ref 0–37)
Alkaline Phosphatase: 165 U/L — ABNORMAL HIGH (ref 39–117)
BUN: 13 mg/dL (ref 6–23)
CO2: 24 mEq/L (ref 19–32)
CREATININE: 0.58 mg/dL (ref 0.50–1.10)
Calcium: 9 mg/dL (ref 8.4–10.5)
Chloride: 103 mEq/L (ref 96–112)
GFR calc non Af Amer: >90 mL/min (ref >90)
Glucose, Bld: 107 mg/dL — ABNORMAL HIGH (ref 70–99)
Potassium: 3.9 mEq/L (ref 3.7–5.3)
Sodium: 142 mEq/L (ref 137–147)
TOTAL PROTEIN: 7.2 g/dL (ref 6.0–8.3)
Total Bilirubin: 0.4 mg/dL (ref 0.3–1.2)

## 2013-11-26 LAB — POC URINE PREG, ED: PREG TEST UR: NEGATIVE

## 2013-11-26 LAB — CBC WITH DIFFERENTIAL/PLATELET
BASOS PCT: 1 % (ref 0–1)
Basophils Absolute: 0.1 10*3/uL (ref 0.0–0.1)
EOS ABS: 0.5 10*3/uL (ref 0.0–0.7)
Eosinophils Relative: 5 % (ref 0–5)
HEMATOCRIT: 42.7 % (ref 36.0–46.0)
HEMOGLOBIN: 15.2 g/dL — AB (ref 12.0–15.0)
Lymphocytes Relative: 26 % (ref 12–46)
Lymphs Abs: 2.7 10*3/uL (ref 0.7–4.0)
MCH: 31.3 pg (ref 26.0–34.0)
MCHC: 35.6 g/dL (ref 30.0–36.0)
MCV: 87.9 fL (ref 78.0–100.0)
MONO ABS: 1 10*3/uL (ref 0.1–1.0)
MONOS PCT: 9 % (ref 3–12)
Neutro Abs: 6.1 10*3/uL (ref 1.7–7.7)
Neutrophils Relative %: 59 % (ref 43–77)
Platelets: 394 10*3/uL (ref 150–400)
RBC: 4.86 MIL/uL (ref 3.87–5.11)
RDW: 13.4 % (ref 11.5–15.5)
WBC: 10.3 10*3/uL (ref 4.0–10.5)

## 2013-11-26 LAB — URINALYSIS, ROUTINE W REFLEX MICROSCOPIC
Bilirubin Urine: NEGATIVE
Glucose, UA: NEGATIVE mg/dL
KETONES UR: NEGATIVE mg/dL
NITRITE: NEGATIVE
Protein, ur: NEGATIVE mg/dL
Specific Gravity, Urine: 1.01 (ref 1.005–1.030)
Urobilinogen, UA: 1 mg/dL (ref 0.0–1.0)
pH: 6.5 (ref 5.0–8.0)

## 2013-11-26 LAB — I-STAT TROPONIN, ED: Troponin i, poc: 0 ng/mL (ref 0.00–0.08)

## 2013-11-26 LAB — LIPASE, BLOOD: Lipase: 47 U/L (ref 11–59)

## 2013-11-26 NOTE — ED Notes (Signed)
Pt reports 7/10 upper abdominal cramping with N/V/D x 3 weeks. Pt also reports frequent vaginal bleeding x 2 weeks. Denies urinary symptoms. Pt in NAD. Denies taking anything for pain.

## 2013-11-27 ENCOUNTER — Emergency Department (HOSPITAL_COMMUNITY)
Admission: EM | Admit: 2013-11-27 | Discharge: 2013-11-27 | Disposition: A | Discharge status: Home / Self Care | Payer: Self-pay | Attending: Emergency Medicine | Admitting: Emergency Medicine

## 2013-11-27 ENCOUNTER — Emergency Department (HOSPITAL_COMMUNITY): Payer: Medicaid Other

## 2013-11-27 ENCOUNTER — Emergency Department (HOSPITAL_COMMUNITY): Payer: Self-pay

## 2013-11-27 DIAGNOSIS — N939 Abnormal uterine and vaginal bleeding, unspecified: Secondary | ICD-10-CM

## 2013-11-27 DIAGNOSIS — K2970 Gastritis, unspecified, without bleeding: Secondary | ICD-10-CM

## 2013-11-27 MED ORDER — PANTOPRAZOLE SODIUM 40 MG PO TBEC
40.0000 mg | DELAYED_RELEASE_TABLET | Freq: Every day | ORAL | Status: DC
Start: 2013-11-27 — End: 2013-11-27
  Administered 2013-11-27: 40 mg via ORAL
  Filled 2013-11-27: qty 1

## 2013-11-27 MED ORDER — GI COCKTAIL ~~LOC~~
30.0000 mL | Freq: Once | ORAL | Status: AC
  Administered 2013-11-27: 30 mL via ORAL
  Filled 2013-11-27: qty 30

## 2013-11-27 MED ORDER — ONDANSETRON 4 MG PO TBDP
ORAL_TABLET | ORAL | Status: DC
Start: 2013-11-27 — End: 2014-01-11

## 2013-11-27 MED ORDER — LANSOPRAZOLE 30 MG PO CPDR: 30.0000 mg | DELAYED_RELEASE_CAPSULE | Freq: Every day | ORAL | Status: DC

## 2013-11-27 MED ORDER — ONDANSETRON 4 MG PO TBDP
4.0000 mg | ORAL_TABLET | Freq: Once | ORAL | Status: AC
  Administered 2013-11-27: 4 mg via ORAL
  Filled 2013-11-27: qty 1

## 2013-11-27 NOTE — Discharge Instructions (Signed)
Gastritis, Adult Gastritis is soreness and swelling (inflammation) of the lining of the stomach. Gastritis can develop as a sudden onset (acute) or long-term (chronic) condition. If gastritis is not treated, it can lead to stomach bleeding and ulcers. CAUSES  Gastritis occurs when the stomach lining is weak or damaged. Digestive juices from the stomach then inflame the weakened stomach lining. The stomach lining may be weak or damaged due to viral or bacterial infections. One common bacterial infection is the Helicobacter pylori infection. Gastritis can also result from excessive alcohol consumption, taking certain medicines, or having too much acid in the stomach.  SYMPTOMS  In some cases, there are no symptoms. When symptoms are present, they may include:  Pain or a burning sensation in the upper abdomen.  Nausea.  Vomiting.  An uncomfortable feeling of fullness after eating. DIAGNOSIS  Your caregiver may suspect you have gastritis based on your symptoms and a physical exam. To determine the cause of your gastritis, your caregiver may perform the following:  Blood or stool tests to check for the H pylori bacterium.  Gastroscopy. A thin, flexible tube (endoscope) is passed down the esophagus and into the stomach. The endoscope has a light and camera on the end. Your caregiver uses the endoscope to view the inside of the stomach.  Taking a tissue sample (biopsy) from the stomach to examine under a microscope. TREATMENT  Depending on the cause of your gastritis, medicines may be prescribed. If you have a bacterial infection, such as an H pylori infection, antibiotics may be given. If your gastritis is caused by too much acid in the stomach, H2 blockers or antacids may be given. Your caregiver may recommend that you stop taking aspirin, ibuprofen, or other nonsteroidal anti-inflammatory drugs (NSAIDs). HOME CARE INSTRUCTIONS  Only take over-the-counter or prescription medicines as directed by  your caregiver.  If you were given antibiotic medicines, take them as directed. Finish them even if you start to feel better.  Drink enough fluids to keep your urine clear or pale yellow.  Avoid foods and drinks that make your symptoms worse, such as:  Caffeine or alcoholic drinks.  Chocolate.  Peppermint or mint flavorings.  Garlic and onions.  Spicy foods.  Citrus fruits, such as oranges, lemons, or limes.  Tomato-based foods such as sauce, chili, salsa, and pizza.  Fried and fatty foods.  Eat small, frequent meals instead of large meals. SEEK IMMEDIATE MEDICAL CARE IF:   You have black or dark red stools.  You vomit blood or material that looks like coffee grounds.  You are unable to keep fluids down.  Your abdominal pain gets worse.  You have a fever.  You do not feel better after 1 week.  You have any other questions or concerns. MAKE SURE YOU:  Understand these instructions.  Will watch your condition.  Will get help right away if you are not doing well or get worse. Document Released: 02/20/2001 Document Revised: 08/28/2011 Document Reviewed: 04/11/2011 [facility] Patient Information 2015 [place], [place]. This information is not intended to replace advice given to you by your health care provider. Make sure you discuss any questions you have with your health care provider.  Dysfunctional Uterine Bleeding Normally, menstrual periods begin between ages 21 to 22 in young women. A normal menstrual cycle/period may begin every 23 days up to 35 days and lasts from 1 to 7 days. Around 12 to 14 days before your menstrual period starts, ovulation (ovary produces an egg) occurs. When counting the  time between menstrual periods, count from the first day of bleeding of the previous period to the first day of bleeding of the next period. Dysfunctional (abnormal) uterine bleeding is bleeding that is different from a normal menstrual period. Your periods may come earlier or  later than usual. They may be lighter, have blood clots or be heavier. You may have bleeding between periods, or you may skip one period or more. You may have bleeding after sexual intercourse, bleeding after menopause, or no menstrual period. CAUSES   Pregnancy (normal, miscarriage, tubal).  IUDs (intrauterine device, birth control).  Birth control pills.  Hormone treatment.  Menopause.  Infection of the cervix.  Blood clotting problems.  Infection of the inside lining of the uterus.  Endometriosis, inside lining of the uterus growing in the pelvis and other female organs.  Adhesions (scar tissue) inside the uterus.  Obesity or severe weight loss.  Uterine polyps inside the uterus.  Cancer of the vagina, cervix, or uterus.  Ovarian cysts or polycystic ovary syndrome.  Medical problems (diabetes, thyroid disease).  Uterine fibroids (noncancerous tumor).  Problems with your female hormones.  Endometrial hyperplasia, very thick lining and enlarged cells inside of the uterus.  Medicines that interfere with ovulation.  Radiation to the pelvis or abdomen.  Chemotherapy. DIAGNOSIS   Your doctor will discuss the history of your menstrual periods, medicines you are taking, changes in your weight, stress in your life, and any medical problems you may have.  Your doctor will do a physical and pelvic examination.  Your doctor may want to perform certain tests to make a diagnosis, such as:  Pap test.  Blood tests.  Cultures for infection.  CT scan.  Ultrasound.  Hysteroscopy.  Laparoscopy.  MRI.  Hysterosalpingography.  D and C.  Endometrial biopsy. TREATMENT  Treatment will depend on the cause of the dysfunctional uterine bleeding (DUB). Treatment may include:  Observing your menstrual periods for a couple of months.  Prescribing medicines for medical problems, including:  Antibiotics.  Hormones.  Birth control pills.  Removing an IUD  (intrauterine device, birth control).  Surgery:  D and C (scrape and remove tissue from inside the uterus).  Laparoscopy (examine inside the abdomen with a lighted tube).  Uterine ablation (destroy lining of the uterus with electrical current, laser, heat, or freezing).  Hysteroscopy (examine cervix and uterus with a lighted tube).  Hysterectomy (remove the uterus). HOME CARE INSTRUCTIONS   If medicines were prescribed, take exactly as directed. Do not change or switch medicines without consulting your caregiver.  Long term heavy bleeding may result in iron deficiency. Your caregiver may have prescribed iron pills. They help replace the iron that your body lost from heavy bleeding. Take exactly as directed.  Do not take aspirin or medicines that contain aspirin one week before or during your menstrual period. Aspirin may make the bleeding worse.  If you need to change your sanitary pad or tampon more than once every 2 hours, stay in bed with your feet elevated and a cold pack on your lower abdomen. Rest as much as possible, until the bleeding stops or slows down.  Eat well-balanced meals. Eat foods high in iron. Examples are:  Leafy green vegetables.  Whole-grain breads and cereals.  Eggs.  Meat.  Liver.  Do not try to lose weight until the abnormal bleeding has stopped and your blood iron level is back to normal. Do not lift more than ten pounds or do strenuous activities when you are bleeding.  For a couple of months, make note on your calendar, marking the start and ending of your period, and the type of bleeding (light, medium, heavy, spotting, clots or missed periods). This is for your caregiver to better evaluate your problem. SEEK MEDICAL CARE IF:   You develop nausea (feeling sick to your stomach) and vomiting, dizziness, or diarrhea while you are taking your medicine.  You are getting lightheaded or weak.  You have any problems that may be related to the medicine  you are taking.  You develop pain with your DUB.  You want to remove your IUD.  You want to stop or change your birth control pills or hormones.  You have any type of abnormal bleeding mentioned above.  You are over 76 years old and have not had a menstrual period yet.  You are 28 years old and you are still having menstrual periods.  You have any of the symptoms mentioned above.  You develop a rash. SEEK IMMEDIATE MEDICAL CARE IF:   An oral temperature above 102 F (38.9 C) develops.  You develop chills.  You are changing your sanitary pad or tampon more than once an hour.  You develop abdominal pain.  You pass out or faint. Document Released: 02/24/2000 Document Revised: 05/21/2011 Document Reviewed: 01/25/2009 [facility] Patient Information 2015 [place], [place]. This information is not intended to replace advice given to you by your health care provider. Make sure you discuss any questions you have with your health care provider.

## 2013-11-27 NOTE — ED Provider Notes (Signed)
CSN: [id number]     Arrival date & time 11/26/13  2234 History   First MD Initiated Contact with Patient 11/27/13 0146     Chief Complaint  Patient presents with  . Abdominal Pain  . Vaginal Bleeding     (Consider location/radiation/quality/duration/timing/severity/associated sxs/prior Treatment) HPI Patient presents with 3 weeks of intermittent upper abdominal pain. She states it's worse after eating fatty and spicy foods. Is associated with nausea and vomiting. She also has intermittent loose stools. She denies any fevers or chills. Denies any blood in vomit or stool. Patient also complains of vaginal spotting for the past 2 weeks. She has a Mirena implants. Denies any lower bowel pain. She's had no discharge. She has no urinary symptoms. Patient has appointment to followup with her OB/GYN. Past Medical History  Diagnosis Date  . Herpes genitalia   . HSV (herpes simplex virus) infection     last outbreak in may  . Mitral valve prolapse   . Status post repair of cor triatriatum     Echocardiogram 12/05: EF 55-65%, no mitral valve prolapse, mild RVE and no mention of any residual ASD.    Marland Kitchen Abnormal Pap smear   . Gestational diabetes   . Infection     UTI  . Anxiety     on meds  . Healthcare-associated pneumonia 10/31/2012   Past Surgical History  Procedure Laterality Date  . Cardiac surgery      congenital heart defect repair at 9 mos  . Exploration post operative open heart    . Cesarean section N/A 10/06/2012    Procedure: Primary CESAREAN SECTION  of baby girl at 0347 APGAR 8/8;  Surgeon: Philip Aspen, DO;  Location: [facility];  Service: Obstetrics;  Laterality: N/A;  . Laparotomy N/A 10/07/2012    Procedure: EXPLORATORY LAPAROTOMY;  Surgeon: Freddrick March. Tenny Craw, MD;  Location: [facility];  Service: Gynecology;  Laterality: N/A;   Family History  Problem Relation Age of Onset  . Diabetes Mother   . Asthma Mother   . Diabetes Father   . Asthma Father   . Diabetes Maternal  Grandmother   . Diabetes Paternal Grandfather    History  Substance Use Topics  . Smoking status: Former Games developer  . Smokeless tobacco: Never Used     Comment: quit 1st trimester  . Alcohol Use: Yes     Comment: social   OB History   Grav Para Term Preterm Abortions TAB SAB Ect Mult Living   0 1 0 0 2     Review of Systems  Constitutional: Negative for fever and chills.  Respiratory: Negative for cough and shortness of breath.   Cardiovascular: Negative for chest pain and palpitations.  Gastrointestinal: Positive for nausea, vomiting, abdominal pain and diarrhea. Negative for constipation and blood in stool.  Genitourinary: Positive for vaginal bleeding. Negative for dysuria, frequency, hematuria, flank pain, vaginal discharge, vaginal pain and pelvic pain.  Musculoskeletal: Negative for back pain, joint swelling, myalgias, neck pain and neck stiffness.  Skin: Negative for rash and wound.  Neurological: Negative for dizziness, weakness, light-headedness, numbness and headaches.  All other systems reviewed and are negative.     Allergies  Mucinex  Home Medications   Prior to Admission medications   Medication Sig Start Date End Date Taking? Authorizing Provider  cetirizine (ZYRTEC) 10 MG tablet Take 10 mg by mouth daily as needed for allergies.   Yes Historical Provider, MD  FLUoxetine (PROZAC) 20 MG capsule Take  20 mg by mouth daily.   Yes Historical Provider, MD  furosemide (LASIX) 20 MG tablet Take 10 mg by mouth daily.   Yes Historical Provider, MD  levonorgestrel (MIRENA) 20 MCG/24HR IUD 1 each by Intrauterine route once.   Yes Historical Provider, MD  lisinopril (PRINIVIL,ZESTRIL) 5 MG tablet Take 5 mg by mouth daily.   Yes Historical Provider, MD  Multiple Vitamin (MULTIVITAMIN WITH MINERALS) TABS tablet Take 1 tablet by mouth daily.   Yes Historical Provider, MD  lansoprazole (PREVACID) 30 MG capsule Take 1 capsule (30 mg total) by mouth daily at 12 noon.  11/27/13   Loren Racer, MD  ondansetron (ZOFRAN ODT) 4 MG disintegrating tablet  ODT q4 hours prn nausea/vomit 11/27/13   Loren Racer, MD   BP 107/69  Pulse 70  Temp(Src) 98.1 F (36.7 C) (Oral)  Resp 16  SpO2 99%  LMP 11/26/2013 Physical Exam  Nursing note and vitals reviewed. Constitutional: She is oriented to person, place, and time. She appears well-developed and well-nourished. No distress.  HENT:  Head: Normocephalic and atraumatic.  Mouth/Throat: Oropharynx is clear and moist.  Eyes: EOM are normal. Pupils are equal, round, and reactive to light.  Neck: Normal range of motion. Neck supple.  Cardiovascular: Normal rate and regular rhythm.   Pulmonary/Chest: Effort normal and breath sounds normal. No respiratory distress. She has no wheezes. She has no rales.  Abdominal: Soft. Bowel sounds are normal. She exhibits no distension and no mass. There is tenderness (patient has epigastric tenderness to palpation. There is no rebound or guarding.). There is no rebound and no guarding.  Musculoskeletal: Normal range of motion. She exhibits no edema and no tenderness.  No CVA tenderness  Neurological: She is alert and oriented to person, place, and time.  Skin: Skin is warm and dry. No rash noted. No erythema.  Psychiatric: She has a normal mood and affect. Her behavior is normal.    ED Course  Procedures (including critical care time) Labs Review Labs Reviewed  CBC WITH DIFFERENTIAL - Abnormal; Notable for the following:    Hemoglobin 15.2 (*)    All other components within normal limits  COMPREHENSIVE METABOLIC PANEL - Abnormal; Notable for the following:    Glucose, Bld 107 (*)    AST 42 (*)    ALT 39 (*)    Alkaline Phosphatase 165 (*)    All other components within normal limits  URINALYSIS, ROUTINE W REFLEX MICROSCOPIC - Abnormal; Notable for the following:    APPearance CLOUDY (*)    Hgb urine dipstick MODERATE (*)    Leukocytes, UA TRACE (*)    All other  components within normal limits  URINE MICROSCOPIC-ADD ON - Abnormal; Notable for the following:    Squamous Epithelial / LPF MANY (*)    All other components within normal limits  LIPASE, BLOOD  I-STAT TROPOININ, ED  POC URINE PREG, ED    Imaging Review No results found.   EKG Interpretation   Date/Time:  Thursday November 26 2013 22:45:27 EDT Ventricular Rate:  91 PR Interval:  146 QRS Duration: 80 QT Interval:  364 QTC Calculation: 447 R Axis:   101 Text Interpretation:  Normal sinus rhythm Rightward axis Nonspecific T  wave abnormality Abnormal ECG ED PHYSICIAN INTERPRETATION AVAILABLE IN  [facility] Confirmed by TEST, Record ([id number]) on 11/28/2013 8:50:23 AM      MDM   Final diagnoses:  Gastritis  Vaginal spotting    Patient's symptoms have improved with medication. So without  any evidence of cholelithiasis. Symptoms likely consistent with gastritis We'll start on PPI and nausea medication. She's been given return precautions. Patient will followup with her OB/GYN for occasional vaginal spotting    Loren Racer, MD 11/29/13 [phone]

## 2014-01-09 NOTE — Progress Notes (Signed)
HPI Patient is a 28 year old with a history of cor triatriatum and small ASD. Patient's last delivery complicated.   Underwent C Section.  Received 8U PRBC. Echo showed LVEF was depressed at 30%   I saw her in Oct 2014 Since seen she says she has done well  She has run out of her medicines Denies SOB  No dizziness NO CP  No PND    Allergies  Allergen Reactions  . Mucinex [Guaifenesin Er] Shortness Of Breath    Current Outpatient Prescriptions  Medication Sig Dispense Refill  . cetirizine (ZYRTEC) 10 MG tablet Take 10 mg by mouth daily as needed for allergies.    Marland Kitchen. FLUoxetine (PROZAC) 20 MG capsule Take 20 mg by mouth daily.    . lansoprazole (PREVACID) 30 MG capsule Take 1 capsule (30 mg total) by mouth daily at 12 noon. 30 capsule 0  . levonorgestrel (MIRENA) 20 MCG/24HR IUD 1 each by Intrauterine route once.    . Multiple Vitamin (MULTIVITAMIN WITH MINERALS) TABS tablet Take 1 tablet by mouth daily.    . furosemide (LASIX) 20 MG tablet Take 10 mg by mouth daily.    Marland Kitchen. lisinopril (PRINIVIL,ZESTRIL) 5 MG tablet Take 5 mg by mouth daily.     No current facility-administered medications for this visit.    Past Medical History  Diagnosis Date  . Herpes genitalia   . HSV (herpes simplex virus) infection     last outbreak in may  . Mitral valve prolapse   . Status post repair of cor triatriatum     Echocardiogram 12/05: EF 55-65%, no mitral valve prolapse, mild RVE and no mention of any residual ASD.    Marland Kitchen. Abnormal Pap smear   . Gestational diabetes   . Infection     UTI  . Anxiety     on meds  . Healthcare-associated pneumonia 10/31/2012    Past Surgical History  Procedure Laterality Date  . Cardiac surgery      congenital heart defect repair at 9 mos  . Exploration post operative open heart    . Cesarean section N/A 10/06/2012    Procedure: Primary CESAREAN SECTION  of baby girl at 0347 APGAR 8/8;  Surgeon: Philip AspenSidney Callahan, DO;  Location: [facility];  Service: Obstetrics;   Laterality: N/A;  . Laparotomy N/A 10/07/2012    Procedure: EXPLORATORY LAPAROTOMY;  Surgeon: Freddrick MarchKendra H. Tenny Crawoss, MD;  Location: [facility];  Service: Gynecology;  Laterality: N/A;    Family History  Problem Relation Age of Onset  . Diabetes Mother   . Asthma Mother   . Diabetes Father   . Asthma Father   . Diabetes Maternal Grandmother   . Diabetes Paternal Grandfather     History   Social History  . Marital Status: Married    Spouse Name: N/A    Number of Children: N/A  . Years of Education: N/A   Occupational History  . Not on file.   Social History Main Topics  . Smoking status: Former Games developermoker  . Smokeless tobacco: Never Used     Comment: quit 1st trimester  . Alcohol Use: Yes     Comment: social  . Drug Use: No  . Sexual Activity: Yes    Birth Control/ Protection: None   Other Topics Concern  . Not on file   Social History Narrative    Review of Systems:  All systems reviewed.  They are negative to the above problem except as previously stated.  Vital Signs: Filed  Vitals:   01/11/14 1405  BP: 100/64  Pulse: 74   Filed Vitals:   01/11/14 1405  Height: 4\' 9"  (1.448 m)  Weight: 179 lb 1.9 oz (81.248 kg)     Physical Exam  Patient is in NAD  HEENT:  Normocephalic, atraumatic. EOMI, PERRLA.  Neck: JVP is normal. No thyromegaly. No bruits.  Lungs: clear to auscultation. No rales no wheezes.  Heart: Regular rate and rhythm. Normal S1, S2. No S3.   No significant murmurs. PMI not displaced. Chest:  Tender to palpation.  Reproduces pain.  Abdomen:  Supple, nontender. Normal bowel sounds. No masses. No hepatomegaly.  Extremities:   Good distal pulses throughout. No lower extremity edema.  Musculoskeletal :moving all extremities.  Neuro:   alert and oriented x3.  CN II-XII grossly intact. 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  Hx Cor triatriatum

## 2014-01-11 ENCOUNTER — Ambulatory Visit (INDEPENDENT_AMBULATORY_CARE_PROVIDER_SITE_OTHER): Payer: Self-pay | Admitting: Internal Medicine

## 2014-01-11 ENCOUNTER — Encounter: Payer: Self-pay | Admitting: Internal Medicine

## 2014-01-11 VITALS — BP 100/64 | HR 74 | Ht <= 58 in | Wt 179.1 lb

## 2014-01-11 DIAGNOSIS — I5022 Chronic systolic (congestive) heart failure: Secondary | ICD-10-CM

## 2014-01-11 LAB — BRAIN NATRIURETIC PEPTIDE: Pro B Natriuretic peptide (BNP): 26 pg/mL (ref 0.0–100.0)

## 2014-01-11 LAB — BASIC METABOLIC PANEL WITH GFR
BUN: 12 mg/dL (ref 6–23)
CO2: 29 meq/L (ref 19–32)
Calcium: 9.1 mg/dL (ref 8.4–10.5)
Chloride: 102 meq/L (ref 96–112)
Creatinine, Ser: 0.6 mg/dL (ref 0.4–1.2)
GFR: 129.07 mL/min
Glucose, Bld: 76 mg/dL (ref 70–99)
Potassium: 3.5 meq/L (ref 3.5–5.1)
Sodium: 137 meq/L (ref 135–145)

## 2014-01-11 NOTE — Patient Instructions (Signed)
Your physician recommends that you return for lab work in: TODAY (BMET, BNP)  Your physician has requested that you have an echocardiogram. Echocardiography is a painless test that uses sound waves to create images of your heart. It provides your doctor with information about the size and shape of your heart and how well your heart's chambers and valves are working. This procedure takes approximately one hour. There are no restrictions for this procedure.  Your physician wants you to follow-up NEXT SUMMER WITH DR ROSS.   You will receive a reminder letter in the mail two months in advance. If you don't receive a letter, please call our office to schedule the follow-up appointment.

## 2014-01-18 ENCOUNTER — Ambulatory Visit (HOSPITAL_COMMUNITY): Payer: Medicaid Other | Attending: Internal Medicine | Admitting: Radiology

## 2014-01-18 DIAGNOSIS — I5022 Chronic systolic (congestive) heart failure: Secondary | ICD-10-CM

## 2014-01-18 DIAGNOSIS — Q242 Cor triatriatum: Secondary | ICD-10-CM

## 2014-01-18 DIAGNOSIS — Z87891 Personal history of nicotine dependence: Secondary | ICD-10-CM | POA: Insufficient documentation

## 2014-01-18 NOTE — Progress Notes (Signed)
Echocardiogram performed.  

## 2014-02-12 IMAGING — CR DG CHEST 2V
2 series · 2 of 2 positions shown · non-contrast
Comparison: 10/08/2012

CLINICAL DATA: 3 weeks postpartum.  Shortness of breath with
productive cough.

CHEST - 2 VIEW

[view not recorded (1 of 2)]
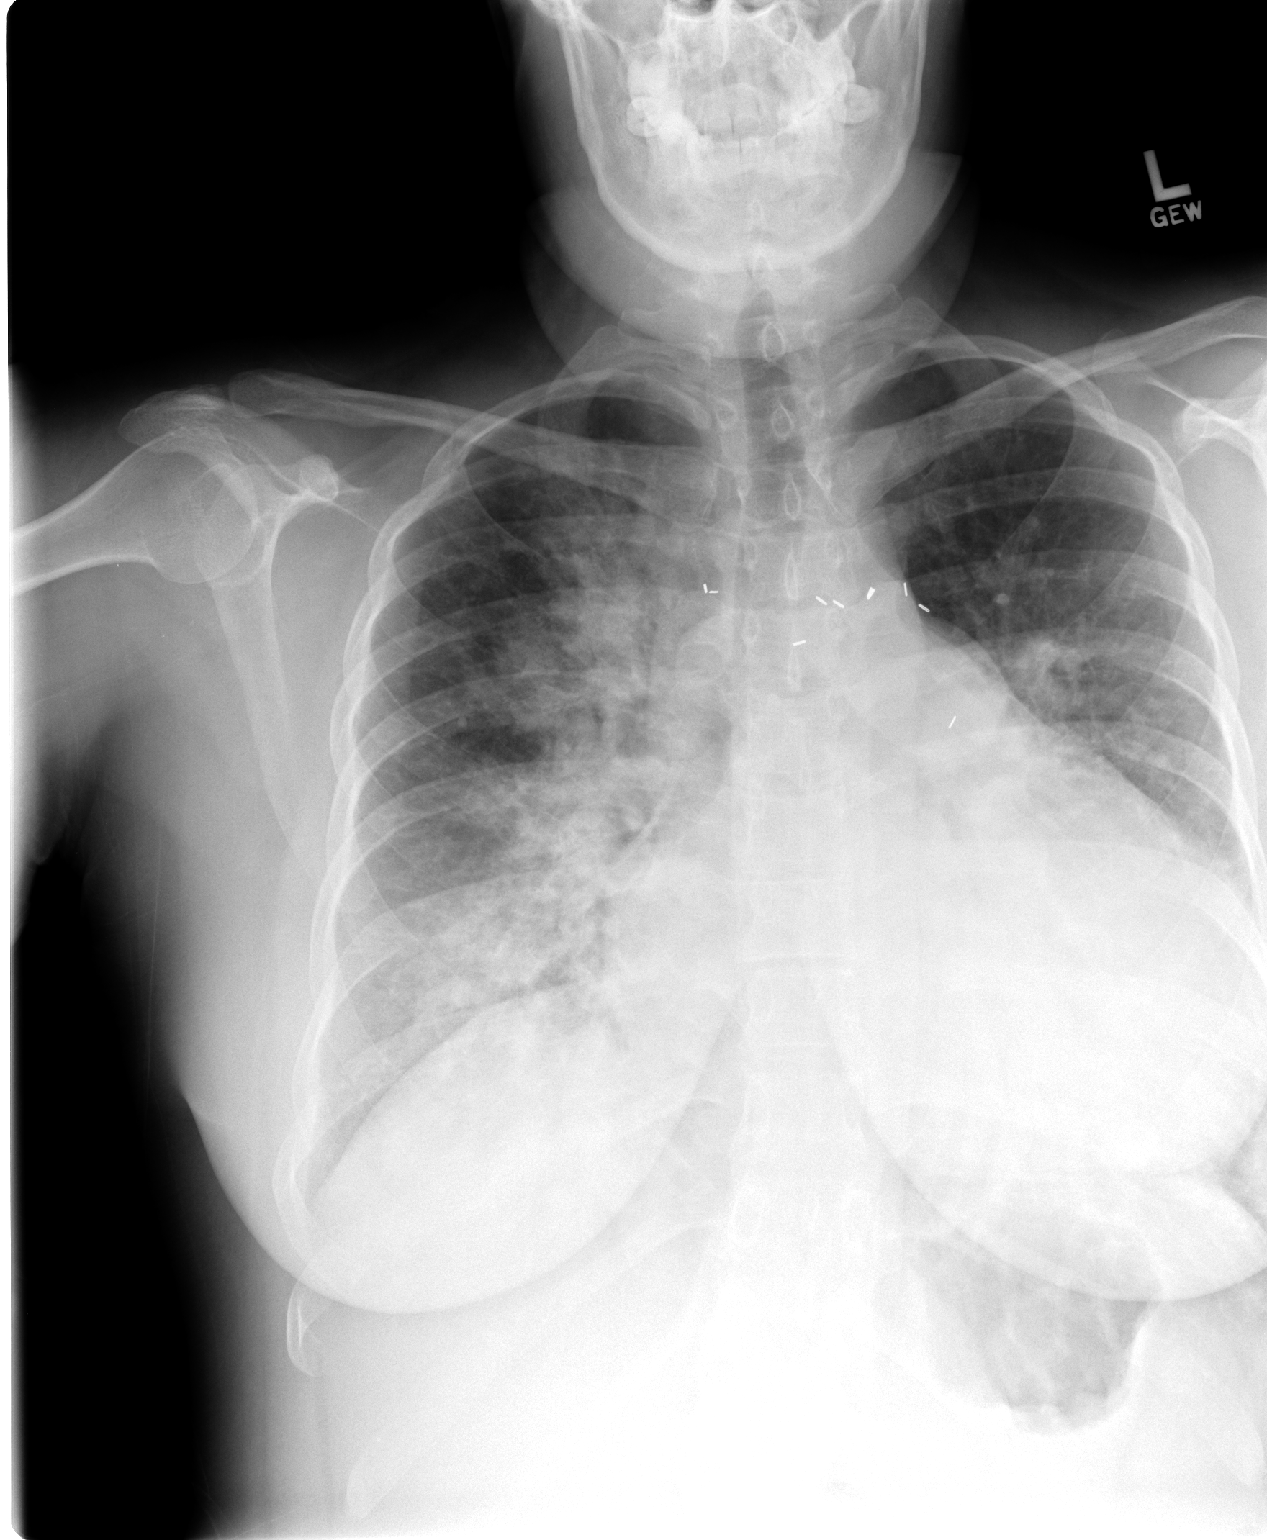

[view not recorded (2 of 2)]
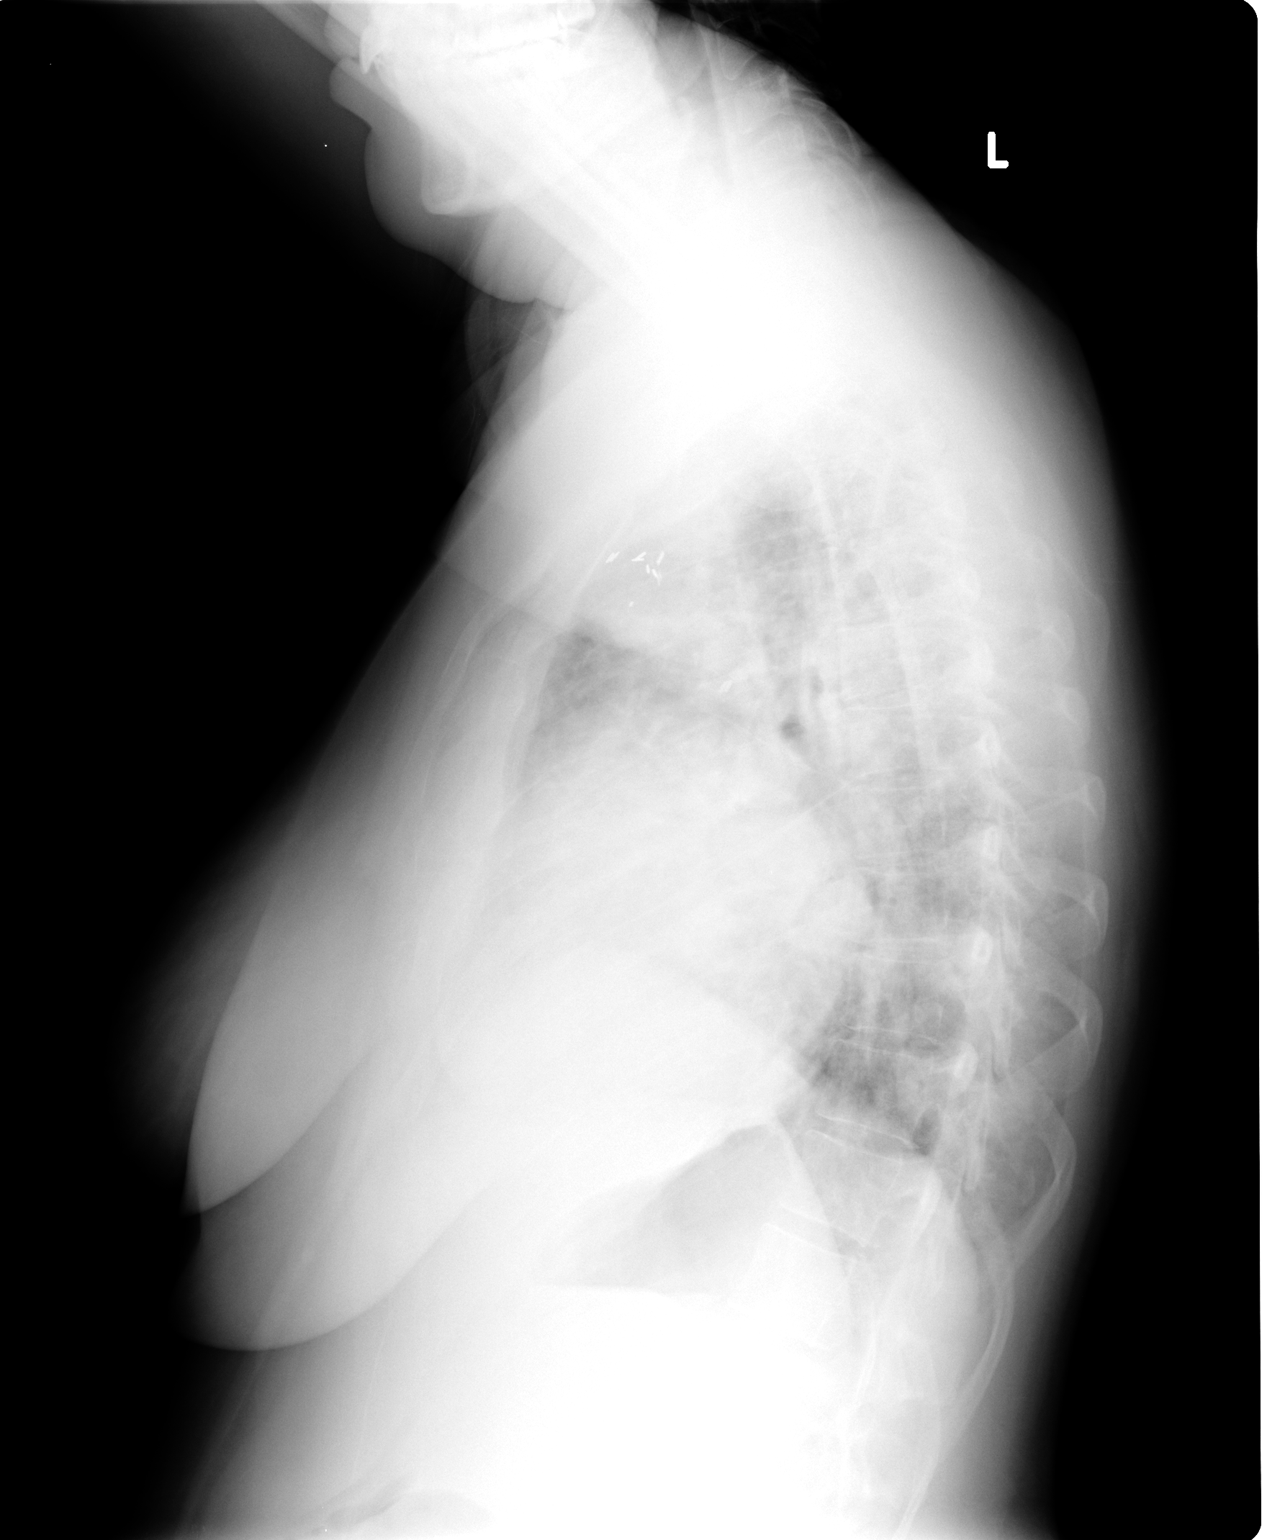

[2 of 2 positions shown; findings below may reference images not displayed]

FINDINGS: Lungs are adequately inflated demonstrate moderate
perihilar bibasilar opacification right worse than left which may
be due to edema versus multi focal pneumonia. No definite effusions
seen.  Moderate stable cardiomegaly.  Surgical clips over the
anterior/superior mediastinum unchanged.  Remainder of the exam is
unchanged.
IMPRESSION: Worsening bilateral perihilar bibasilar opacification right worse
than left suggesting multifocal pneumonia versus edema.

Stable cardiomegaly.

## 2014-02-12 IMAGING — US US ABDOMEN COMPLETE
1 series · 13 of 25 positions shown · non-contrast
Comparison: 10/07/2012

CLINICAL DATA: Right upper quadrant pain.  Elevated liver function
studies.

COMPLETE ABDOMINAL ULTRASOUND

[Series 1: us abdomen complete · 0.30mm/px · 13 of 98 slices shown]
[im 1/98]
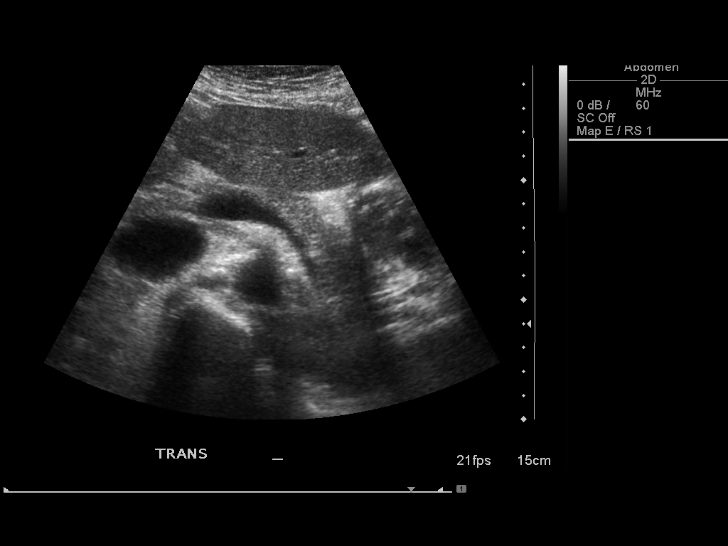
[im 9/98]
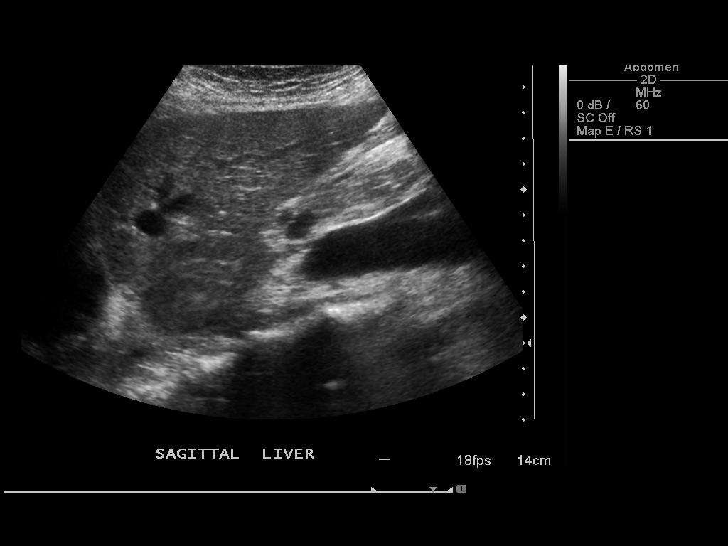
[im 17/98]
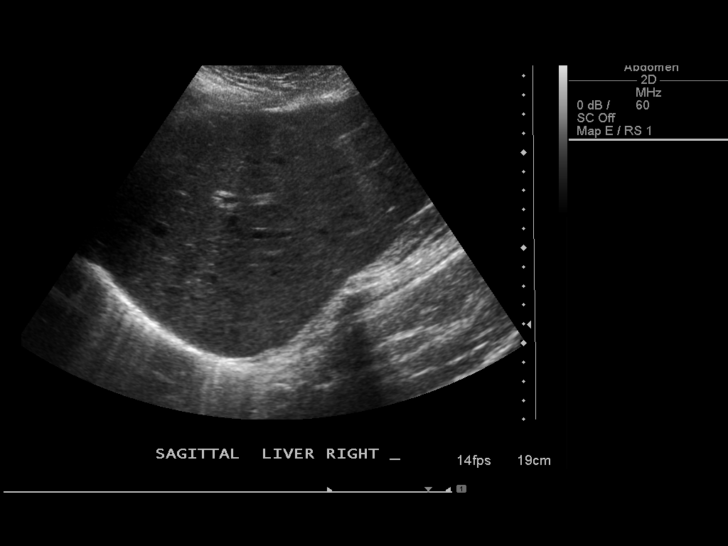
[im 25/98]
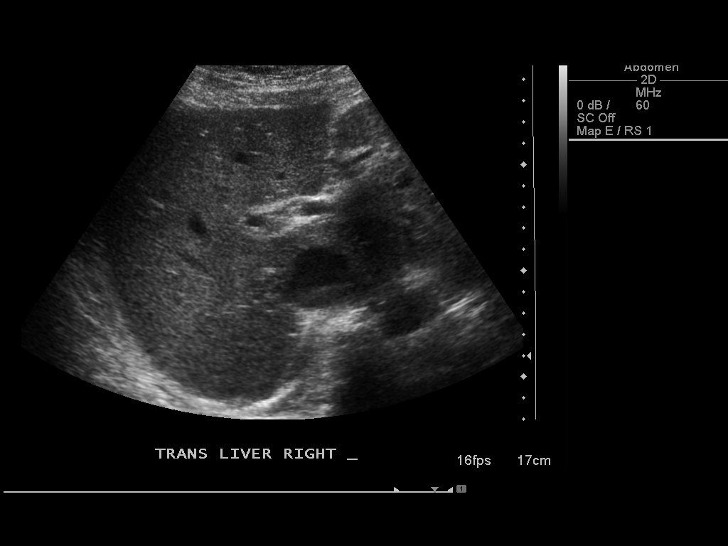
[im 33/98]
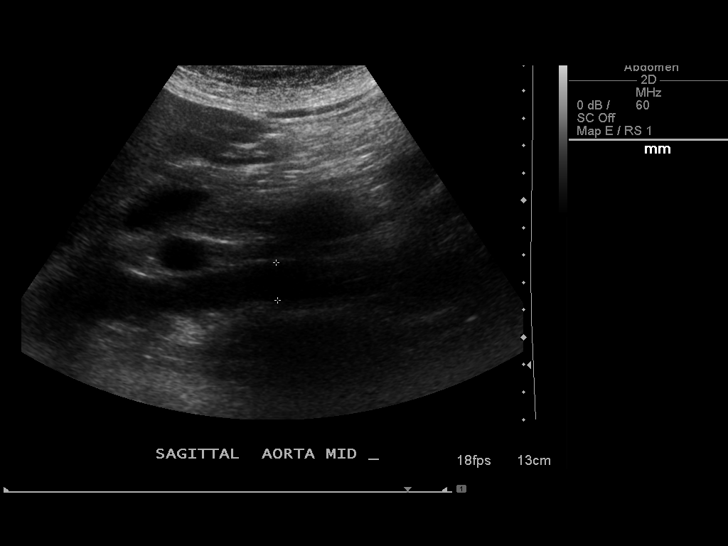
[im 41/98]
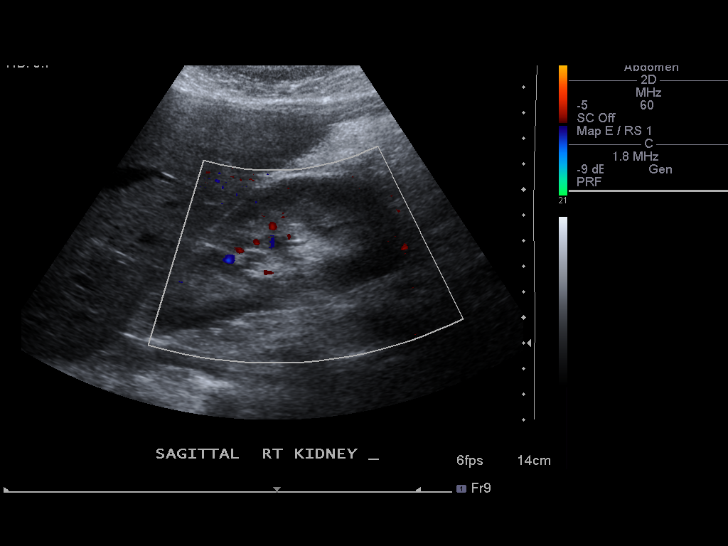
[im 49/98]
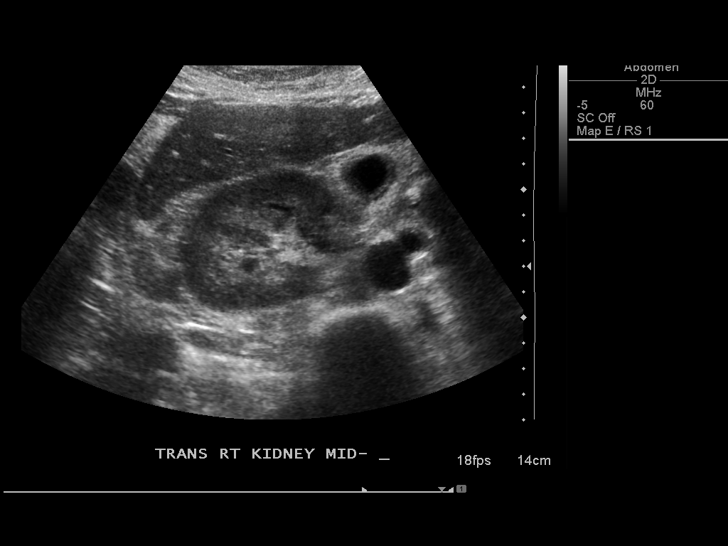
[im 57/98]
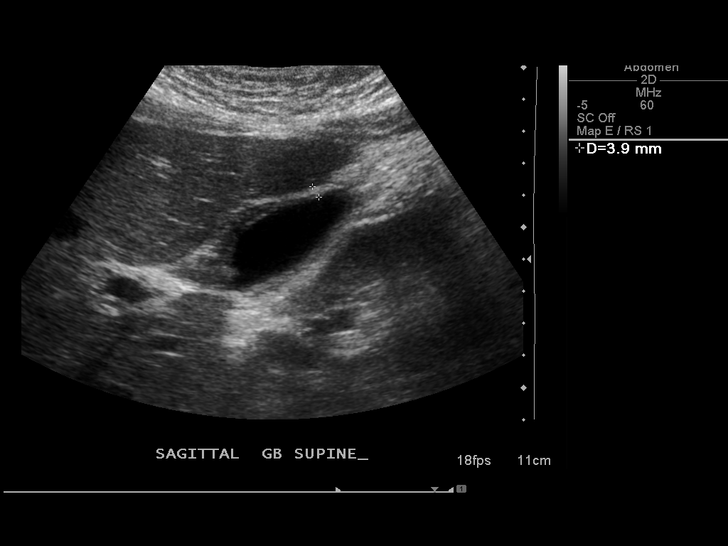
[im 65/98]
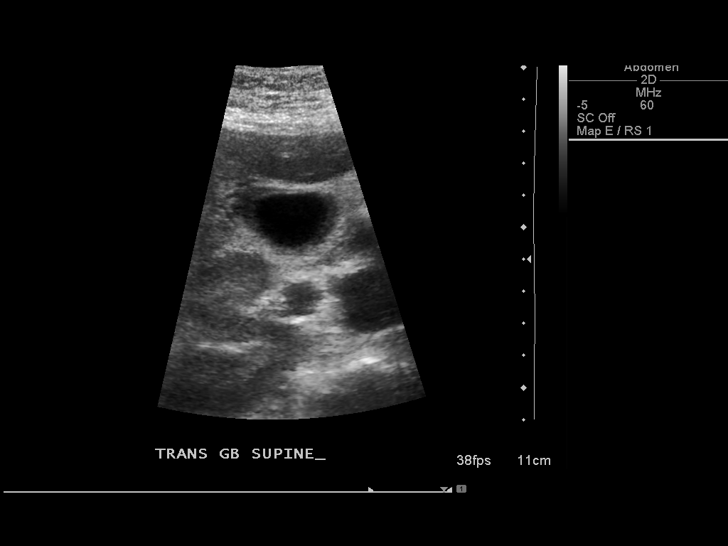
[im 73/98]
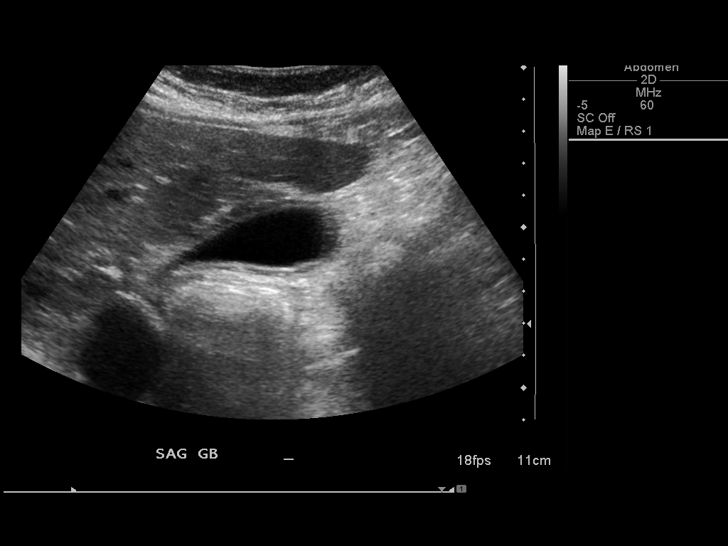
[im 81/98]
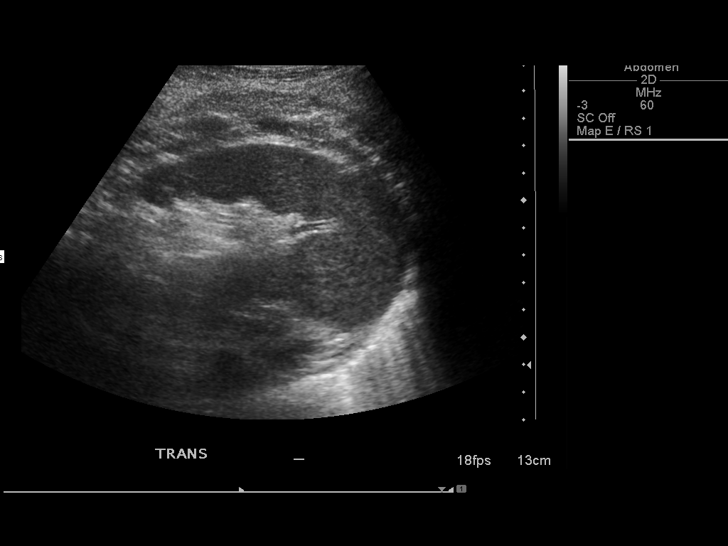
[im 89/98]
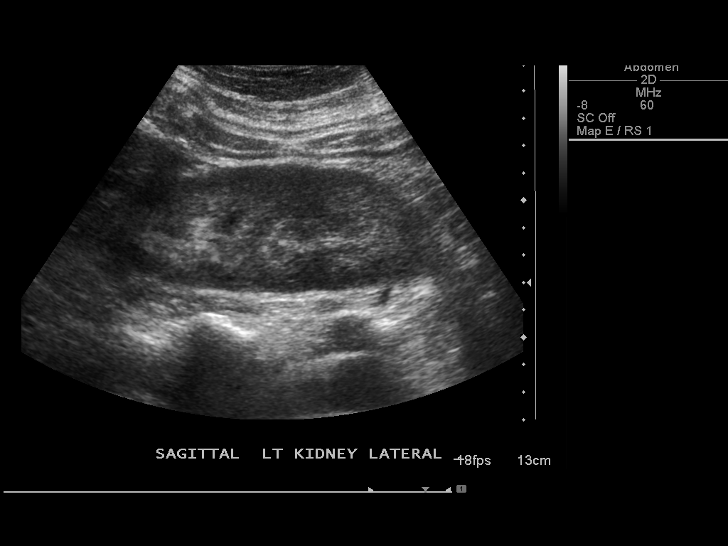
[im 98/98]
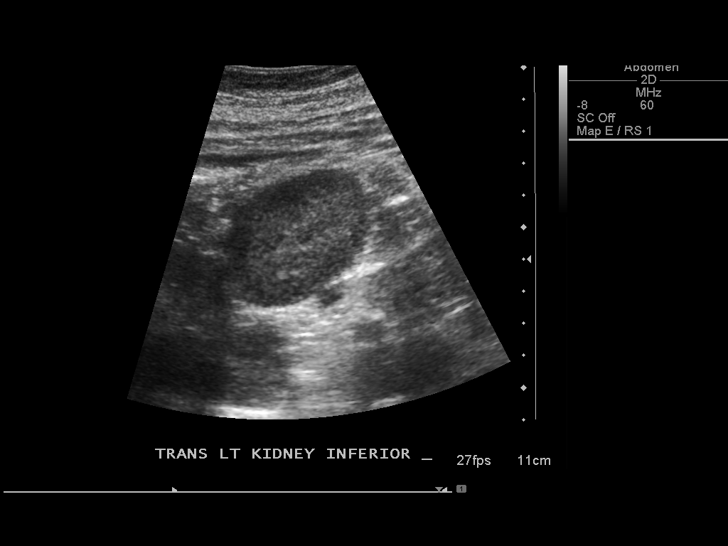

[13 of 25 positions shown; findings below may reference images not displayed]

FINDINGS: Gallbladder:  Diffuse gallbladder wall thickening and edema.  Wall
thickness measures from between 4-7 mm.  No gallstones or sludge
are visualized but there is a positive Murphy's sign.  Findings may
represent inflammatory process or acalculus cholecystitis.

Common bile duct:  Normal caliber with measured diameter of 2 mm.

Liver:  Small amount of fluid is noted around the liver.  Compared
with the previous CT scan, the amount of ascites appears to be
decreased.  No focal liver lesions.

IVC:  Appears normal.

Pancreas:  No focal abnormality seen.

Spleen:  Spleen length measures 8.7 cm.  Normal parenchymal
echotexture.

Right Kidney:  Right kidney measures 11.6 cm length.  No
hydronephrosis.

Left Kidney:  Left kidney measures 12.8 cm length.  No
hydronephrosis.

Abdominal aorta:  No aneurysm identified.
IMPRESSION: Gallbladder wall is thickened and edematous in the Murphy's sign is
positive.  No stones or sludge are visualized.  Changes could
represent inflammatory process, edema, or acalculus cholecystitis.
Small amount of fluid around the liver edge appears to be decreased
since previous CT.

## 2015-07-08 ENCOUNTER — Encounter: Payer: Self-pay | Admitting: *Deleted

## 2015-07-11 ENCOUNTER — Institutional Professional Consult (permissible substitution): Payer: Medicaid Other | Admitting: Pulmonary Disease

## 2016-10-04 ENCOUNTER — Emergency Department (HOSPITAL_COMMUNITY): Payer: Medicaid Other

## 2016-10-04 ENCOUNTER — Emergency Department (HOSPITAL_COMMUNITY)
Admission: EM | Admit: 2016-10-04 | Discharge: 2016-10-04 | Disposition: A | Discharge status: Home / Self Care | Payer: Medicaid Other | Attending: Emergency Medicine | Admitting: Emergency Medicine

## 2016-10-04 ENCOUNTER — Encounter (HOSPITAL_COMMUNITY): Payer: Self-pay

## 2016-10-04 DIAGNOSIS — F17210 Nicotine dependence, cigarettes, uncomplicated: Secondary | ICD-10-CM | POA: Diagnosis not present

## 2016-10-04 DIAGNOSIS — I5022 Chronic systolic (congestive) heart failure: Secondary | ICD-10-CM | POA: Diagnosis not present

## 2016-10-04 DIAGNOSIS — I110 Hypertensive heart disease with heart failure: Secondary | ICD-10-CM | POA: Diagnosis not present

## 2016-10-04 DIAGNOSIS — R590 Localized enlarged lymph nodes: Secondary | ICD-10-CM | POA: Diagnosis not present

## 2016-10-04 DIAGNOSIS — E785 Hyperlipidemia, unspecified: Secondary | ICD-10-CM | POA: Diagnosis not present

## 2016-10-04 DIAGNOSIS — E119 Type 2 diabetes mellitus without complications: Secondary | ICD-10-CM | POA: Insufficient documentation

## 2016-10-04 DIAGNOSIS — K029 Dental caries, unspecified: Secondary | ICD-10-CM

## 2016-10-04 DIAGNOSIS — R6884 Jaw pain: Secondary | ICD-10-CM | POA: Diagnosis present

## 2016-10-04 MED ORDER — CLINDAMYCIN HCL 300 MG PO CAPS: 300.0000 mg | ORAL_CAPSULE | Freq: Four times a day (QID) | ORAL | 0 refills | Status: DC

## 2016-10-04 MED ORDER — IOPAMIDOL (ISOVUE-300) INJECTION 61%
INTRAVENOUS | Status: AC
  Administered 2016-10-04: 75 mL
  Filled 2016-10-04: qty 75

## 2016-10-04 MED ORDER — HYDROCODONE-ACETAMINOPHEN 5-325 MG PO TABS: 2.0000 | ORAL_TABLET | ORAL | 0 refills | Status: DC | PRN

## 2016-10-04 NOTE — ED Provider Notes (Signed)
[facility]-EMER[facility]ENCY DEPT Provider Note   CSN: [id number] Arrival date & time: 10/04/16  1430     History   Chief Complaint Chief Complaint  Patient presents with  . Jaw Pain    HPI [name] is a 31 y.o. female w/ a h/o of periodontal disease and TMJ who presents to the emergency department with right sided jaw and neck pain. She reports that her symptoms began 2 weeks ago. She reports that over the last few days she has developed more difficulty with swallowing, sore throat, and worsening pain to the right side of her neck and trismus. No fever, chills, or neck stiffness.   She reports that she was diagnosed with TMJ in 2015 and has home treatments that were previously successful with no improvement of her symptoms.  She reports that she was evaluated by her dentist earlier today and referred to an oral surgeon to have several teeth pulled in the near future.   The history is provided by the patient. No language interpreter was used.    Past Medical History:  Diagnosis Date  . Abnormal Pap smear   . Anxiety    on meds  . CHF (congestive heart failure) (HCC)   . DM2 (diabetes mellitus, type 2) (HCC)   . Gestational diabetes   . Healthcare-associated pneumonia 10/31/2012  . Herpes genitalia   . HSV (herpes simplex virus) infection    last outbreak in may  . Hyperlipidemia   . Hypertension   . Infection    UTI  . Mitral valve prolapse   . Obesity   . Status post repair of cor triatriatum    Echocardiogram 12/05: EF 55-65%, no mitral valve prolapse, mild RVE and no mention of any residual ASD.      Patient Active Problem List   Diagnosis Date Noted  . 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 Acute systolic congestive heart failure (HCC) 11/03/2012  . Healthcare-associated pneumonia 10/31/2012  . Elevated LFTs 10/31/2012  . Cholecystitis 10/31/2012  . Normal IUP (intrauterine pregnancy) on prenatal ultrasound 07/05/2011  . Subchorionic hemorrhage 07/05/2011  .  Tobacco use 04/18/2011  . Chest pain, unspecified 03/19/2011  . Junctional rhythm 03/19/2011  . 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 Normal labor and delivery 09/24/2010  . No pertinent past medical history   . No pertinent past medical history     Past Surgical History:  Procedure Laterality Date  . CARDIAC SURGERY     congenital heart defect repair at 9 mos  . CESAREAN SECTION N/A 10/06/2012   Procedure: Primary CESAREAN SECTION  of baby girl at 0347 APGAR 8/8;  Surgeon: Philip AspenSidney Callahan, DO;  Location: [facility];  Service: Obstetrics;  Laterality: N/A;  . EXPLORATION POST OPERATIVE OPEN HEART    . LAPAROTOMY N/A 10/07/2012   Procedure: EXPLORATORY LAPAROTOMY;  Surgeon: Freddrick MarchKendra H. Tenny Crawoss, MD;  Location: [facility];  Service: Gynecology;  Laterality: N/A;    OB History    Gravida Para Term Preterm AB Living   3 2 1 1 1 2    SAB TAB Ectopic Multiple Live Births   1 0 0 0 2       Home Medications    Prior to Admission medications   Medication Sig Start Date End Date Taking? Authorizing Provider  cetirizine (ZYRTEC) 10 MG tablet Take 10 mg by mouth daily as needed for allergies.    [provider]  clindamycin (CLEOCIN) 300 MG capsule Take 1 capsule (300 mg total) by mouth every 6 (six) hours. 10/04/16  Cheron Schaumann K, PA-C  FLUoxetine (PROZAC) 20 MG capsule Take 20 mg by mouth daily.    [provider]  furosemide (LASIX) 20 MG tablet Take 10 mg by mouth daily.    [provider]  HYDROcodone-acetaminophen (NORCO/VICODIN) 5-325 MG tablet Take 2 tablets by mouth every 4 (four) hours as needed. 10/04/16   Elson Areas, PA-C  lansoprazole (PREVACID) 30 MG capsule Take 1 capsule (30 mg total) by mouth daily at 12 noon. 11/27/13   Loren Racer, MD  levonorgestrel (MIRENA) 20 MCG/24HR IUD 1 each by Intrauterine route once.    [provider]  lisinopril (PRINIVIL,ZESTRIL) 5 MG tablet Take 5 mg by mouth daily.    [provider]  Multiple Vitamin  (MULTIVITAMIN WITH MINERALS) TABS tablet Take 1 tablet by mouth daily.    [provider]    Family History Family History  Problem Relation Age of Onset  . Diabetes Mother   . Asthma Mother   . Diabetes Father   . Asthma Father   . Diabetes Maternal Grandmother   . Diabetes Paternal Grandfather     Social History Social History  Substance Use Topics  . Smoking status: Current Some Day Smoker    Packs/day: 0.50    Types: Cigarettes  . Smokeless tobacco: Never Used  . Alcohol use 0.0 oz/week     Comment: social     Allergies   Mucinex [guaifenesin er]   Review of Systems Review of Systems  Constitutional: Negative for chills and fever.  HENT: Positive for facial swelling, sore throat and trouble swallowing. Negative for voice change.   Musculoskeletal: Positive for neck pain. Negative for neck stiffness.   Physical Exam Updated Vital Signs BP 117/77 (BP Location: Right Arm)   Pulse (!) 103   Temp 98.5 F (36.9 C) (Oral)   Resp 20   LMP 08/09/2016 (Approximate)   SpO2 100%   Physical Exam  Constitutional: No distress.  HENT:  Head: Normocephalic.  Mouth/Throat: There is trismus in the jaw.  Trismus noted. Unable to visualize the oropharynx or uvula d/t decreased ROM of the jaw.  Eyes: Conjunctivae are normal.  Neck: Neck supple.  TTP over the right anterior neck with an area of induration near the proximal anterior neck. No left-sided neck pain.   Cardiovascular: Normal rate, regular rhythm and normal heart sounds.  Exam reveals no gallop and no friction rub.   No murmur heard. Pulmonary/Chest: Effort normal and breath sounds normal. No respiratory distress. She has no wheezes. She has no rales. She exhibits no tenderness.  Abdominal: Soft. She exhibits no distension.  Neurological: She is alert.  Skin: Skin is warm. No rash noted.  Psychiatric: Her behavior is normal.  Nursing note and vitals reviewed.  ED Treatments / Results  Labs (all labs  ordered are listed, but only abnormal results are displayed) Labs Reviewed - No data to display  EKG  EKG Interpretation None       Radiology Ct Soft Tissue Neck W Contrast  Result Date: 10/04/2016 CLINICAL DATA:  Right-sided jaw pain and dysphagia. EXAM: CT NECK WITH CONTRAST TECHNIQUE: Multidetector CT imaging of the neck was performed using the standard protocol following the bolus administration of intravenous contrast. CONTRAST:  75mL ISOVUE-300 IOPAMIDOL (ISOVUE-300) INJECTION 61% COMPARISON:  Facial CT 07/01/2013 FINDINGS: Pharynx and larynx: --Nasopharynx: Fossae of Rosenmuller are clear. Normal adenoid tonsils for age. --Oral cavity and oropharynx: There are large dental caries and periapical lucencies of teeth 31 and 32.  There is mild inflammatory change in the right retromolar trigone. --Hypopharynx: Normal vallecula and pyriform sinuses. --Larynx: Normal epiglottis and pre-epiglottic space. Normal aryepiglottic and vocal folds. --Retropharyngeal space: No abscess, effusion or lymphadenopathy. Salivary glands: --Parotid: No mass lesion or inflammation. No sialolithiasis or ductal dilatation. --Submandibular: Symmetric without inflammation. No sialolithiasis or ductal dilatation. --Sublingual: Normal. No ranula or other visible lesion of the base of tongue and floor of mouth. Thyroid: Normal. Lymph nodes: There are bilateral level 1 lymph nodes measuring up to 11 mm. No lower cervical adenopathy. Vascular: Normal course and caliber of the major cervical vessels. Limited intracranial: Normal. Visualized orbits: Normal. Mastoids and visualized paranasal sinuses: No fluid levels or advanced mucosal thickening. No mastoid effusion. Skeleton: No bony spinal canal stenosis. No lytic or blastic lesions. Upper chest: Clear. Other: None. IMPRESSION: 1. Large dental caries and periapical lucencies of teeth 31 and 32, the right mandibular second and third molars. Adjacent inflammatory change in the  retromolar trigone. No abscess or drainable fluid collection. 2. Bilateral reactive level 1 cervical lymph nodes. Electronically Signed   By: Deatra RobinsonKevin  Herman M.D.   On: 10/04/2016 18:06    Procedures Procedures (including critical care time)  Medications Ordered in ED Medications  iopamidol (ISOVUE-300) 61 % injection (75 mLs  Contrast Given 10/04/16 1721)     Initial Impression / Assessment and Plan / ED Course  I have reviewed the triage vital signs and the nursing notes.  Pertinent labs & imaging results that were available during my care of the patient were reviewed by me and considered in my medical decision making (see chart for details).     Patient presenting with right-sided neck swelling, induration, and trismus. Tachycardia, but afebrile. Differential dx includes TMJ versus concern for deep space infection given patient's history of periodontal disease, trismus, and right anterior neck swelling and induration. CT soft tissues of the neck pending. Patient care transferred to PA Sofia at the end of my shift. Patient presentation, ED course, and plan of care discussed with review of all pertinent labs and imaging. Please see his/her note for further details regarding further ED course and disposition.  Final Clinical Impressions(s) / ED Diagnoses   Final diagnoses:  Dental decay  Anterior cervical lymphadenopathy    New Prescriptions Discharge Medication List as of 10/04/2016  7:46 PM    START taking these medications   Details  clindamycin (CLEOCIN) 300 MG capsule Take 1 capsule (300 mg total) by mouth every 6 (six) hours., Starting Thu 10/04/2016, Print    HYDROcodone-acetaminophen (NORCO/VICODIN) 5-325 MG tablet Take 2 tablets by mouth every 4 (four) hours as needed., Starting Thu 10/04/2016, Print         Meriah Shands A, PA-C 10/05/16 1037    Rolland PorterJames, Mark, MD 10/18/16 [phone]

## 2016-10-04 NOTE — ED Notes (Signed)
See provider assessment acuity 4

## 2016-10-04 NOTE — ED Triage Notes (Signed)
Pt presents for evaluation of R sided jaw pain, reports hx of TMJ. States has tried home treatment for TMJ with no improvement. Also endorses some perceived swelling to R jaw. Pt able to speak, unable to open mouth widely.

## 2016-10-04 NOTE — ED Provider Notes (Signed)
Pt returned from CT.  Pt reports improvement in pain.  Pt counseled on results.  Pt given dental referral. Meds ordered this encounter  Medications  . iopamidol (ISOVUE-300) 61 % injection    Inez Catalinaakes, [place]   : cabinet override  . clindamycin (CLEOCIN) 300 MG capsule    Sig: Take 1 capsule (300 mg total) by mouth every 6 (six) hours.    Dispense:  40 capsule    Refill:  0    Order Specific Question:   Supervising Provider    Answer:   MILLER, BRIAN [[id number]]  . HYDROcodone-acetaminophen (NORCO/VICODIN) 5-325 MG tablet    Sig: Take 2 tablets by mouth every 4 (four) hours as needed.    Dispense:  16 tablet    Refill:  0    Order Specific Question:   Supervising Provider    Answer:   Eber HongMILLER, BRIAN [[id number]]   An After Visit Summary was printed and given to the patient.   Osie CheeksSofia, Leslie K, PA-C 10/04/16 1947    Rolland PorterJames, Mark, MD 10/18/16 [phone]

## 2016-11-10 ENCOUNTER — Emergency Department (HOSPITAL_COMMUNITY): Payer: Medicaid Other

## 2016-11-10 ENCOUNTER — Emergency Department (HOSPITAL_COMMUNITY)
Admission: EM | Admit: 2016-11-10 | Discharge: 2016-11-10 | Disposition: A | Discharge status: Home / Self Care | Payer: Medicaid Other | Attending: Emergency Medicine | Admitting: Emergency Medicine

## 2016-11-10 ENCOUNTER — Encounter (HOSPITAL_COMMUNITY): Payer: Self-pay | Admitting: Emergency Medicine

## 2016-11-10 DIAGNOSIS — F419 Anxiety disorder, unspecified: Secondary | ICD-10-CM | POA: Insufficient documentation

## 2016-11-10 DIAGNOSIS — Y998 Other external cause status: Secondary | ICD-10-CM | POA: Diagnosis not present

## 2016-11-10 DIAGNOSIS — I5021 Acute systolic (congestive) heart failure: Secondary | ICD-10-CM | POA: Insufficient documentation

## 2016-11-10 DIAGNOSIS — F17210 Nicotine dependence, cigarettes, uncomplicated: Secondary | ICD-10-CM | POA: Insufficient documentation

## 2016-11-10 DIAGNOSIS — S060X1A Concussion with loss of consciousness of 30 minutes or less, initial encounter: Secondary | ICD-10-CM | POA: Diagnosis not present

## 2016-11-10 DIAGNOSIS — Y929 Unspecified place or not applicable: Secondary | ICD-10-CM | POA: Diagnosis not present

## 2016-11-10 DIAGNOSIS — W19XXXA Unspecified fall, initial encounter: Secondary | ICD-10-CM

## 2016-11-10 DIAGNOSIS — F10920 Alcohol use, unspecified with intoxication, uncomplicated: Secondary | ICD-10-CM | POA: Diagnosis not present

## 2016-11-10 DIAGNOSIS — S0083XA Contusion of other part of head, initial encounter: Secondary | ICD-10-CM | POA: Insufficient documentation

## 2016-11-10 DIAGNOSIS — W010XXA Fall on same level from slipping, tripping and stumbling without subsequent striking against object, initial encounter: Secondary | ICD-10-CM | POA: Insufficient documentation

## 2016-11-10 DIAGNOSIS — Y9389 Activity, other specified: Secondary | ICD-10-CM | POA: Diagnosis not present

## 2016-11-10 DIAGNOSIS — R55 Syncope and collapse: Secondary | ICD-10-CM | POA: Diagnosis present

## 2016-11-10 DIAGNOSIS — E119 Type 2 diabetes mellitus without complications: Secondary | ICD-10-CM | POA: Insufficient documentation

## 2016-11-10 DIAGNOSIS — Z79899 Other long term (current) drug therapy: Secondary | ICD-10-CM | POA: Insufficient documentation

## 2016-11-10 DIAGNOSIS — B009 Herpesviral infection, unspecified: Secondary | ICD-10-CM | POA: Diagnosis not present

## 2016-11-10 DIAGNOSIS — I110 Hypertensive heart disease with heart failure: Secondary | ICD-10-CM | POA: Diagnosis not present

## 2016-11-10 MED ORDER — ONDANSETRON 4 MG PO TBDP
4.0000 mg | ORAL_TABLET | Freq: Once | ORAL | Status: AC
  Administered 2016-11-10: 4 mg via ORAL
  Filled 2016-11-10: qty 1

## 2016-11-10 NOTE — ED Triage Notes (Signed)
Per EMS, pt coming from [facility] after a fall. Hematoma to the forehead. Answers questions appropriately, follows commands.

## 2016-11-10 NOTE — ED Notes (Signed)
Patient transported to CT 

## 2016-11-10 NOTE — Discharge Instructions (Signed)
Do not participate in any sports or any activities that could result in head trauma until you are cleared by your pediatrician,  primary care physician or neurologist.   Please follow with your primary care doctor in the next 2 days for a check-up. They must obtain records for further management.   Do not hesitate to return to the Emergency Department for any new, worsening or concerning symptoms.   

## 2016-11-10 NOTE — ED Provider Notes (Signed)
[facility]-EMERGENCY DEPT Provider Note   CSN: [id number] Arrival date & time: 11/10/16  0258     History   Chief Complaint Chief Complaint  Patient presents with  . Fall   HPI   Blood pressure 101/74, pulse 81, temperature 97.6 F (36.4 C), temperature source Oral, resp. rate (!) 23, SpO2 95 %.  [name] is a 31 y.o. female complaining of fall tonight after she was drinking heavily. There is a single episode of emesis. She states that she may have lost consciousness. She denies any other trauma or pain. She denies change in her vision, dysarthria, ataxia, chest pain or abdominal pain.  Past Medical History:  Diagnosis Date  . Abnormal Pap smear   . Anxiety    on meds  . CHF (congestive heart failure) (HCC)   . DM2 (diabetes mellitus, type 2) (HCC)   . Gestational diabetes   . Healthcare-associated pneumonia 10/31/2012  . Herpes genitalia   . HSV (herpes simplex virus) infection    last outbreak in may  . Hyperlipidemia   . Hypertension   . Infection    UTI  . Mitral valve prolapse   . Obesity   . Status post repair of cor triatriatum    Echocardiogram 12/05: EF 55-65%, no mitral valve prolapse, mild RVE and no mention of any residual ASD.      Patient Active Problem List   Diagnosis Date Noted  . 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 Acute systolic congestive heart failure (HCC) 11/03/2012  . Healthcare-associated pneumonia 10/31/2012  . Elevated LFTs 10/31/2012  . Cholecystitis 10/31/2012  . Normal IUP (intrauterine pregnancy) on prenatal ultrasound 07/05/2011  . Subchorionic hemorrhage 07/05/2011  . Tobacco use 04/18/2011  . Chest pain, unspecified 03/19/2011  . Junctional rhythm 03/19/2011  . 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 Normal labor and delivery 09/24/2010  . No pertinent past medical history   . No pertinent past medical history     Past Surgical History:  Procedure Laterality Date  . CARDIAC SURGERY     congenital heart defect repair at 9 mos   . CESAREAN SECTION N/A 10/06/2012   Procedure: Primary CESAREAN SECTION  of baby girl at 0347 APGAR 8/8;  Surgeon: Philip Aspen, DO;  Location: [facility];  Service: Obstetrics;  Laterality: N/A;  . EXPLORATION POST OPERATIVE OPEN HEART    . LAPAROTOMY N/A 10/07/2012   Procedure: EXPLORATORY LAPAROTOMY;  Surgeon: Freddrick March. Tenny Craw, MD;  Location: [facility] ORS;  Service: Gynecology;  Laterality: N/A;    OB History    Gravida Para Term Preterm AB Living   3 2 1 1 1 2    SAB TAB Ectopic Multiple Live Births   1 0 0 0 2       Home Medications    Prior to Admission medications   Medication Sig Start Date End Date Taking? Authorizing Provider  cetirizine (ZYRTEC) 10 MG tablet Take 10 mg by mouth daily as needed for allergies.    [provider]  clindamycin (CLEOCIN) 300 MG capsule Take 1 capsule (300 mg total) by mouth every 6 (six) hours. 10/04/16   Elson Areas, PA-C  FLUoxetine (PROZAC) 20 MG capsule Take 20 mg by mouth daily.    [provider]  furosemide (LASIX) 20 MG tablet Take 10 mg by mouth daily.    [provider]  HYDROcodone-acetaminophen (NORCO/VICODIN) 5-325 MG tablet Take 2 tablets by mouth every 4 (four) hours as needed. 10/04/16   Elson Areas, PA-C  lansoprazole (PREVACID)  30 MG capsule Take 1 capsule (30 mg total) by mouth daily at 12 noon. 11/27/13   Loren RacerYelverton, David, MD  levonorgestrel (MIRENA) 20 MCG/24HR IUD 1 each by Intrauterine route once.    [provider]  lisinopril (PRINIVIL,ZESTRIL) 5 MG tablet Take 5 mg by mouth daily.    [provider]  Multiple Vitamin (MULTIVITAMIN WITH MINERALS) TABS tablet Take 1 tablet by mouth daily.    [provider]    Family History Family History  Problem Relation Age of Onset  . Diabetes Mother   . Asthma Mother   . Diabetes Father   . Asthma Father   . Diabetes Maternal Grandmother   . Diabetes Paternal Grandfather     Social History Social History  Substance Use  Topics  . Smoking status: Current Some Day Smoker    Packs/day: 0.50    Types: Cigarettes  . Smokeless tobacco: Never Used  . Alcohol use 0.0 oz/week     Comment: social     Allergies   Mucinex [guaifenesin er]   Review of Systems Review of Systems  A complete review of systems was obtained and all systems are negative except as noted in the HPI and PMH.   Physical Exam Updated Vital Signs BP 108/73   Pulse 95   Temp 97.6 F (36.4 C) (Oral)   Resp (!) 27   SpO2 97%   Physical Exam  Constitutional: She is oriented to person, place, and time. She appears well-developed and well-nourished. No distress.  HENT:  Head: Normocephalic and atraumatic.  Mouth/Throat: Oropharynx is clear and moist.  Contusion to left 4 head with no underlying lacerations or abrasions. No tenderness palpation along the orbital rim, extraocular movement is intact without pain or diplopia,   No hemotympanum, battle signs or raccoon's eyes  No crepitance or tenderness to palpation along the orbital rim.  EOMI intact with no pain or diplopia  No abnormal otorrhea or rhinorrhea. Nasal septum midline.  No intraoral trauma.     Eyes: Pupils are equal, round, and reactive to light. Conjunctivae and EOM are normal.  Neck: Normal range of motion.  Cardiovascular: Normal rate, regular rhythm and intact distal pulses.   Pulmonary/Chest: Effort normal and breath sounds normal.  Abdominal: Soft. There is no tenderness.  Musculoskeletal: Normal range of motion.  Neurological: She is alert and oriented to person, place, and time.  Somnolent but arousable to voice, oriented 3, grossly nonfocal neurologic exam  Skin: She is not diaphoretic.  Psychiatric: She has a normal mood and affect.  Nursing note and vitals reviewed.    ED Treatments / Results  Labs (all labs ordered are listed, but only abnormal results are displayed) Labs Reviewed - No data to display  EKG  EKG Interpretation None         Radiology Ct Head Wo Contrast  Result Date: 11/10/2016 CLINICAL DATA:  Larey SeatFell at a bar last night. EXAM: CT HEAD WITHOUT CONTRAST CT CERVICAL SPINE WITHOUT CONTRAST TECHNIQUE: Multidetector CT imaging of the head and cervical spine was performed following the standard protocol without intravenous contrast. Multiplanar CT image reconstructions of the cervical spine were also generated. COMPARISON:  None. FINDINGS: CT HEAD FINDINGS Brain: There is no intracranial hemorrhage, mass or evidence of acute infarction. There is no extra-axial fluid collection. Gray matter and white matter appear normal. Cerebral volume is normal for age. Brainstem and posterior fossa are unremarkable. The CSF spaces appear normal. Vascular: No hyperdense vessel or unexpected calcification. Skull: Normal.  Negative for fracture or focal lesion. Sinuses/Orbits: No acute finding. Other: Left frontal scalp swelling. CT CERVICAL SPINE FINDINGS Alignment: Normal. Skull base and vertebrae: No acute fracture. No primary bone lesion or focal pathologic process. Soft tissues and spinal canal: No prevertebral fluid or swelling. No visible canal hematoma. Disc levels: Congenital fusion of C2 and C3, an incidental finding. Otherwise good preservation of intervertebral disc spaces. Facet articulations are well-preserved and intact. Upper chest: Negative. Other: 12 mm left lobe thyroid nodule. IMPRESSION: 1. Normal brain 2. Negative for acute cervical spine fracture 3. Left lobe thyroid nodule, 12 mm. Recommend nonemergent thyroid ultrasound for characterization. Electronically Signed   By: Ellery Plunk M.D.   On: 11/10/2016 05:14   Ct Cervical Spine Wo Contrast  Result Date: 11/10/2016 CLINICAL DATA:  Larey Seat at a bar last night. EXAM: CT HEAD WITHOUT CONTRAST CT CERVICAL SPINE WITHOUT CONTRAST TECHNIQUE: Multidetector CT imaging of the head and cervical spine was performed following the standard protocol without intravenous contrast.  Multiplanar CT image reconstructions of the cervical spine were also generated. COMPARISON:  None. FINDINGS: CT HEAD FINDINGS Brain: There is no intracranial hemorrhage, mass or evidence of acute infarction. There is no extra-axial fluid collection. Gray matter and white matter appear normal. Cerebral volume is normal for age. Brainstem and posterior fossa are unremarkable. The CSF spaces appear normal. Vascular: No hyperdense vessel or unexpected calcification. Skull: Normal. Negative for fracture or focal lesion. Sinuses/Orbits: No acute finding. Other: Left frontal scalp swelling. CT CERVICAL SPINE FINDINGS Alignment: Normal. Skull base and vertebrae: No acute fracture. No primary bone lesion or focal pathologic process. Soft tissues and spinal canal: No prevertebral fluid or swelling. No visible canal hematoma. Disc levels: Congenital fusion of C2 and C3, an incidental finding. Otherwise good preservation of intervertebral disc spaces. Facet articulations are well-preserved and intact. Upper chest: Negative. Other: 12 mm left lobe thyroid nodule. IMPRESSION: 1. Normal brain 2. Negative for acute cervical spine fracture 3. Left lobe thyroid nodule, 12 mm. Recommend nonemergent thyroid ultrasound for characterization. Electronically Signed   By: Ellery Plunk M.D.   On: 11/10/2016 05:14    Procedures Procedures (including critical care time)  Medications Ordered in ED Medications  ondansetron (ZOFRAN-ODT) disintegrating tablet 4 mg (4 mg Oral Given 11/10/16 0424)     Initial Impression / Assessment and Plan / ED Course  I have reviewed the triage vital signs and the nursing notes.  Pertinent labs & imaging results that were available during my care of the patient were reviewed by me and considered in my medical decision making (see chart for details).     Vitals:   11/10/16 0330 11/10/16 0400 11/10/16 0430 11/10/16 0500  BP: (!) 105/57 107/74 101/74 108/73  Pulse: 80 81  95  Resp: (!) 23  (!) 23  (!) 27  Temp:      TempSrc:      SpO2: 92% 95%  97%    Medications  ondansetron (ZOFRAN-ODT) disintegrating tablet 4 mg (4 mg Oral Given 11/10/16 0424)    Aaylah Pokorny is 31 y.o. female presenting with Fall, forehead contusion, possible loss of consciousness and emesis. Given her intoxicated state, head CT and cervical spine CT pending.  Imaging negative, updated patient. She states that she drinks daily but not heavily, she does not really remember how she got to the emergency department. She states that she does get blackout drunk. She does not wish to have any alcohol detox and declines outpatient resources. She is discharged  to the care of her husband who is sober.  Evaluation does not show pathology that would require ongoing emergent intervention or inpatient treatment. Pt is hemodynamically stable and mentating appropriately. Discussed findings and plan with patient/guardian, who agrees with care plan. All questions answered. Return precautions discussed and outpatient follow up given.    Final Clinical Impressions(s) / ED Diagnoses   Final diagnoses:  Fall, initial encounter  Facial contusion, initial encounter  Concussion with loss of consciousness of 30 minutes or less, initial encounter  Alcoholic intoxication without complication [facility])    New Prescriptions New Prescriptions   No medications on file     Kaylyn Lim 11/10/16 0547    Mancel Bale, MD 11/10/16 [phone]

## 2017-05-19 ENCOUNTER — Emergency Department (HOSPITAL_COMMUNITY): Payer: Medicaid Other

## 2017-05-19 ENCOUNTER — Emergency Department (HOSPITAL_COMMUNITY)
Admission: EM | Admit: 2017-05-19 | Discharge: 2017-05-19 | Disposition: A | Discharge status: Home / Self Care | Payer: Medicaid Other | Attending: Emergency Medicine | Admitting: Emergency Medicine

## 2017-05-19 ENCOUNTER — Other Ambulatory Visit: Payer: Self-pay

## 2017-05-19 ENCOUNTER — Encounter (HOSPITAL_COMMUNITY): Payer: Self-pay | Admitting: *Deleted

## 2017-05-19 DIAGNOSIS — N10 Acute tubulo-interstitial nephritis: Secondary | ICD-10-CM | POA: Diagnosis not present

## 2017-05-19 DIAGNOSIS — E119 Type 2 diabetes mellitus without complications: Secondary | ICD-10-CM | POA: Insufficient documentation

## 2017-05-19 DIAGNOSIS — I509 Heart failure, unspecified: Secondary | ICD-10-CM | POA: Insufficient documentation

## 2017-05-19 DIAGNOSIS — F17210 Nicotine dependence, cigarettes, uncomplicated: Secondary | ICD-10-CM | POA: Diagnosis not present

## 2017-05-19 DIAGNOSIS — R1030 Lower abdominal pain, unspecified: Secondary | ICD-10-CM | POA: Diagnosis not present

## 2017-05-19 DIAGNOSIS — A5901 Trichomonal vulvovaginitis: Secondary | ICD-10-CM | POA: Diagnosis not present

## 2017-05-19 DIAGNOSIS — R300 Dysuria: Secondary | ICD-10-CM | POA: Diagnosis present

## 2017-05-19 DIAGNOSIS — Z79899 Other long term (current) drug therapy: Secondary | ICD-10-CM | POA: Insufficient documentation

## 2017-05-19 DIAGNOSIS — N12 Tubulo-interstitial nephritis, not specified as acute or chronic: Secondary | ICD-10-CM

## 2017-05-19 DIAGNOSIS — I110 Hypertensive heart disease with heart failure: Secondary | ICD-10-CM | POA: Diagnosis not present

## 2017-05-19 LAB — I-STAT BETA HCG BLOOD, ED (MC, WL, AP ONLY)

## 2017-05-19 LAB — CBC
HCT: 41.2 % (ref 36.0–46.0)
HEMOGLOBIN: 14.1 g/dL (ref 12.0–15.0)
MCH: 30.7 pg (ref 26.0–34.0)
MCHC: 34.2 g/dL (ref 30.0–36.0)
MCV: 89.6 fL (ref 78.0–100.0)
Platelets: 317 10*3/uL (ref 150–400)
RBC: 4.6 MIL/uL (ref 3.87–5.11)
RDW: 13.8 % (ref 11.5–15.5)
WBC: 13.4 10*3/uL — ABNORMAL HIGH (ref 4.0–10.5)

## 2017-05-19 LAB — URINALYSIS, ROUTINE W REFLEX MICROSCOPIC
Bilirubin Urine: NEGATIVE
Glucose, UA: NEGATIVE mg/dL
Ketones, ur: 5 mg/dL — AB
Nitrite: NEGATIVE
Protein, ur: 30 mg/dL — AB
SPECIFIC GRAVITY, URINE: 1.017 (ref 1.005–1.030)
pH: 6 (ref 5.0–8.0)

## 2017-05-19 LAB — COMPREHENSIVE METABOLIC PANEL
ALBUMIN: 3.8 g/dL (ref 3.5–5.0)
ALK PHOS: 92 U/L (ref 38–126)
ALT: 17 U/L (ref 14–54)
ANION GAP: 11 (ref 5–15)
AST: 24 U/L (ref 15–41)
BUN: 11 mg/dL (ref 6–20)
CALCIUM: 8.9 mg/dL (ref 8.9–10.3)
CO2: 24 mmol/L (ref 22–32)
CREATININE: 0.75 mg/dL (ref 0.44–1.00)
Chloride: 102 mmol/L (ref 101–111)
GFR calc Af Amer: >60 mL/min (ref >60)
GFR calc non Af Amer: >60 mL/min (ref >60)
Glucose, Bld: 86 mg/dL (ref 65–99)
Potassium: 3.6 mmol/L (ref 3.5–5.1)
SODIUM: 137 mmol/L (ref 135–145)
Total Bilirubin: 1 mg/dL (ref 0.3–1.2)
Total Protein: 7 g/dL (ref 6.5–8.1)

## 2017-05-19 LAB — WET PREP, GENITAL
Sperm: NONE SEEN
YEAST WET PREP: NONE SEEN

## 2017-05-19 LAB — LIPASE, BLOOD: Lipase: 36 U/L (ref 11–51)

## 2017-05-19 MED ORDER — METRONIDAZOLE 500 MG PO TABS: 500.0000 mg | ORAL_TABLET | Freq: Two times a day (BID) | ORAL | 0 refills | Status: DC

## 2017-05-19 MED ORDER — IOPAMIDOL (ISOVUE-300) INJECTION 61%
INTRAVENOUS | Status: AC
  Administered 2017-05-19: 100 mL
  Filled 2017-05-19: qty 100

## 2017-05-19 MED ORDER — CIPROFLOXACIN HCL 500 MG PO TABS: 500.0000 mg | ORAL_TABLET | Freq: Two times a day (BID) | ORAL | 0 refills | Status: DC

## 2017-05-19 NOTE — Progress Notes (Signed)
Pt had 100 mls Isovue 300 IV extravasation left antecubital. Dr Cherly Hensenhang assessed pt. IV was removed, heat pack placed at site, and arm elevated just prior to assessment. Verbal instruction were given to pt and verbal handoff  Was given to [place] Ranch Lakes[place], Charity fundraiserN. Extravasation orders were placed.

## 2017-05-19 NOTE — ED Triage Notes (Signed)
The pt is c/o feeling for 2 weeks with urinary frequency voiding in small amounts.  abd and lower back pains no temp  lmp iud

## 2017-05-19 NOTE — ED Notes (Signed)
Pt presents with lower abd pains bilaterally. Pt does report some pain during urination along with back pain. Pt reports a history of kidney stones, gall bladder disease, and appendicitis.

## 2017-05-19 NOTE — ED Notes (Signed)
IV attempts x2, both unsuccessful.  

## 2017-05-19 NOTE — ED Provider Notes (Signed)
[facility] EMERGENCY DEPARTMENT Provider Note   CSN: [id number] Arrival date & time: 05/19/17  1729     History   Chief Complaint Chief Complaint  Patient presents with  . Abdominal Pain    HPI [name] is a 32 y.o. female.  HPI Patient presents with 3 days of low back pain lower abdominal pain and dysuria.  Some urinary frequency.  No fevers.  No vaginal bleeding or discharge.  Has an IUD in place.  Pain is dull and worse with movements.  Has previous history of kidney stones gallbladder disease and reportedly swollen appendix.  States that was while she was pregnant.  Pain is dull.  Stays in her back and lower abdomen. Past Medical History:  Diagnosis Date  . Abnormal Pap smear   . Anxiety    on meds  . CHF (congestive heart failure) (HCC)   . DM2 (diabetes mellitus, type 2) (HCC)   . Gestational diabetes   . Healthcare-associated pneumonia 10/31/2012  . Herpes genitalia   . HSV (herpes simplex virus) infection    last outbreak in may  . Hyperlipidemia   . Hypertension   . Infection    UTI  . Mitral valve prolapse   . Obesity   . Status post repair of cor triatriatum    Echocardiogram 12/05: EF 55-65%, no mitral valve prolapse, mild RVE and no mention of any residual ASD.      Patient Active Problem List   Diagnosis Date Noted  . 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 Acute systolic congestive heart failure (HCC) 11/03/2012  . Healthcare-associated pneumonia 10/31/2012  . Elevated LFTs 10/31/2012  . Cholecystitis 10/31/2012  . Normal IUP (intrauterine pregnancy) on prenatal ultrasound 07/05/2011  . Subchorionic hemorrhage 07/05/2011  . Tobacco use 04/18/2011  . Chest pain, unspecified 03/19/2011  . Junctional rhythm 03/19/2011  . 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 Normal labor and delivery 09/24/2010  . No pertinent past medical history   . No pertinent past medical history     Past Surgical History:  Procedure Laterality Date  .  CARDIAC SURGERY     congenital heart defect repair at 9 mos  . CESAREAN SECTION N/A 10/06/2012   Procedure: Primary CESAREAN SECTION  of baby girl at 0347 APGAR 8/8;  Surgeon: Philip Aspen, DO;  Location: [facility];  Service: Obstetrics;  Laterality: N/A;  . EXPLORATION POST OPERATIVE OPEN HEART    . LAPAROTOMY N/A 10/07/2012   Procedure: EXPLORATORY LAPAROTOMY;  Surgeon: Freddrick March. Tenny Craw, MD;  Location: [facility];  Service: Gynecology;  Laterality: N/A;    OB History    Gravida Para Term Preterm AB Living   3 2 1 1 1 2    SAB TAB Ectopic Multiple Live Births   1 0 0 0 2       Home Medications    Prior to Admission medications   Medication Sig Start Date End Date Taking? Authorizing Provider  cetirizine (ZYRTEC) 10 MG tablet Take 10 mg by mouth daily as needed for allergies.   Yes [provider]  FLUoxetine (PROZAC) 20 MG capsule Take 20 mg by mouth daily.   Yes [provider]  levonorgestrel (MIRENA) 20 MCG/24HR IUD 1 each by Intrauterine route once.   Yes [provider]  Multiple Vitamin (MULTIVITAMIN WITH MINERALS) TABS tablet Take 1 tablet by mouth daily.   Yes [provider]  ciprofloxacin (CIPRO) 500 MG tablet Take 1 tablet (500 mg total) by mouth 2 (two) times daily.  05/19/17   Benjiman CorePickering, Perley Arthurs, MD  clindamycin (CLEOCIN) 300 MG capsule Take 1 capsule (300 mg total) by mouth every 6 (six) hours. Patient not taking: Reported on 05/19/2017 10/04/16   Elson AreasSofia, Leslie K, PA-C  HYDROcodone-acetaminophen (NORCO/VICODIN) 5-325 MG tablet Take 2 tablets by mouth every 4 (four) hours as needed. Patient not taking: Reported on 05/19/2017 10/04/16   Elson AreasSofia, Leslie K, PA-C  lansoprazole (PREVACID) 30 MG capsule Take 1 capsule (30 mg total) by mouth daily at 12 noon. Patient not taking: Reported on 05/19/2017 11/27/13   Loren RacerYelverton, David, MD  metroNIDAZOLE (FLAGYL) 500 MG tablet Take 1 tablet (500 mg total) by mouth 2 (two) times daily. 05/19/17   Benjiman CorePickering, Adisyn Ruscitti, MD      Family History Family History  Problem Relation Age of Onset  . Diabetes Mother   . Asthma Mother   . Diabetes Father   . Asthma Father   . Diabetes Maternal Grandmother   . Diabetes Paternal Grandfather     Social History Social History   Tobacco Use  . Smoking status: Current Some Day Smoker    Packs/day: 0.50    Types: Cigarettes  . Smokeless tobacco: Never Used  Substance Use Topics  . Alcohol use: Yes    Alcohol/week: 0.0 oz    Comment: social  . Drug use: No     Allergies   Mucinex [guaifenesin er]   Review of Systems Review of Systems  Constitutional: Positive for chills. Negative for appetite change and fever.  HENT: Negative for congestion.   Respiratory: Negative for shortness of breath.   Cardiovascular: Negative for chest pain.  Gastrointestinal: Positive for abdominal pain and nausea.  Genitourinary: Positive for dysuria.  Musculoskeletal: Negative for back pain.  Skin: Negative for rash.  Neurological: Negative for numbness.  Hematological: Negative for adenopathy.  Psychiatric/Behavioral: Negative for confusion.     Physical Exam Updated Vital Signs BP (!) 126/92   Pulse 89   Temp 98 F (36.7 C) (Oral)   Resp (!) 21   Ht 4' 9.75" (1.467 m)   Wt 81.6 kg (180 lb)   SpO2 97%   BMI 37.95 kg/m   Physical Exam  Constitutional: She appears well-developed.  HENT:  Head: Normocephalic.  Eyes: Pupils are equal, round, and reactive to light.  Cardiovascular: Regular rhythm.  Abdominal:  Suprapubic to right lower quadrant tenderness.  Moderate.  No rebound or guarding.  No right upper quadrant tenderness.  Neurological: She is alert.  Skin: Skin is warm. Capillary refill takes less than 2 seconds.  Pelvic exam: No cervical motion tenderness.  Some white vaginal/cervical discharge.   ED Treatments / Results  Labs (all labs ordered are listed, but only abnormal results are displayed) Labs Reviewed  WET PREP, GENITAL - Abnormal;  Notable for the following components:      Result Value   Trich, Wet Prep PRESENT (*)    Clue Cells Wet Prep HPF POC PRESENT (*)    WBC, Wet Prep HPF POC MANY (*)    All other components within normal limits  CBC - Abnormal; Notable for the following components:   WBC 13.4 (*)    All other components within normal limits  URINALYSIS, ROUTINE W REFLEX MICROSCOPIC - Abnormal; Notable for the following components:   APPearance HAZY (*)    Hgb urine dipstick MODERATE (*)    Ketones, ur 5 (*)    Protein, ur 30 (*)    Leukocytes, UA LARGE (*)    Bacteria, UA RARE (*)  Squamous Epithelial / LPF 0-5 (*)    All other components within normal limits  URINE CULTURE  LIPASE, BLOOD  COMPREHENSIVE METABOLIC PANEL  RPR  HIV ANTIBODY (ROUTINE TESTING)  I-STAT BETA HCG BLOOD, ED ([facility], [facility], [facility] ONLY)  GC/CHLAMYDIA PROBE AMP ([place]) NOT AT [facility]    EKG  EKG Interpretation None       Radiology Ct Abdomen Pelvis Wo Contrast  Result Date: 05/19/2017 CLINICAL DATA:  Acute onset of generalized abdominal and lower back pain. Increased urinary frequency. Dysuria. EXAM: CT ABDOMEN AND PELVIS WITHOUT CONTRAST TECHNIQUE: Multidetector CT imaging of the abdomen and pelvis was performed following the standard protocol without IV contrast. 100 mL of Isovue 300 IV contrast extravasated into the patient's left proximal forearm. The site was soft and nontender, without associated symptoms on evaluation. Instructions were provided for treatment and follow-up. COMPARISON:  CT of the abdomen and pelvis from 08/08/2013, and abdominal ultrasound performed 11/27/2013 FINDINGS: Lower chest: The visualized lung bases are grossly clear. The visualized portions of the mediastinum are unremarkable. Hepatobiliary: The liver is unremarkable in appearance. The gallbladder is unremarkable in appearance. The common bile duct remains normal in caliber. Pancreas: The pancreas is within normal limits. Spleen: The spleen is  unremarkable in appearance. Adrenals/Urinary Tract: The adrenal glands are unremarkable in appearance. There is question of minimal haziness about the left kidney, with possible slightly decreased medullary differentiation, not well characterized on CT. Would correlate clinically for evidence of pyelonephritis. A nonobstructing 6 mm stone is noted at the lower pole of the left kidney. There is no evidence of hydronephrosis. No obstructing ureteral stones are identified. No perinephric stranding is appreciated. Stomach/Bowel: The stomach is unremarkable in appearance. The small bowel is within normal limits. The appendix is normal in caliber, without evidence of appendicitis. The colon is unremarkable in appearance. Vascular/Lymphatic: The abdominal aorta is unremarkable in appearance. The inferior vena cava is grossly unremarkable. No retroperitoneal lymphadenopathy is seen. No pelvic sidewall lymphadenopathy is identified. Reproductive: The bladder is decompressed and not well assessed. The patient's intrauterine device is somewhat directed to the right, of uncertain significance. The uterus is otherwise unremarkable. No suspicious adnexal masses are seen. The ovaries are relatively symmetric. Other: No additional soft tissue abnormalities are seen. Musculoskeletal: No acute osseous abnormalities are identified. The visualized musculature is unremarkable in appearance. IMPRESSION: 1. Question of minimal haziness about the left kidney, with possible slightly decreased medullary differentiation, not well characterized on CT. Would correlate clinically to exclude mild pyelonephritis. 2. Nonobstructing 6 mm stone at the lower pole of the left kidney. 3. Intrauterine device is somewhat directed to the right, of uncertain significance. This will be further assessed on the planned pelvic ultrasound. 4. No evidence of appendicitis. Electronically Signed   By: Roanna Raider M.D.   On: 05/19/2017 23:33     Procedures Procedures (including critical care time)  Medications Ordered in ED Medications  iopamidol (ISOVUE-300) 61 % injection (100 mLs  Contrast Given 05/19/17 2151)     Initial Impression / Assessment and Plan / ED Course  I have reviewed the triage vital signs and the nursing notes.  Pertinent labs & imaging results that were available during my care of the patient were reviewed by me and considered in my medical decision making (see chart for details).     Patient with suprapubic pain.  Also some vaginal discharge.  No cervical motion tenderness.  CT scan done without contrast contrast due to loss of IV.  CT  scan reviewed by myself and radiology.  Potential pyelonephritis on left side.  No appendicitis.  Has had some left flank pain with further questioning.  Will treat for pyelonephritis.  Urine culture sent.  Also treat for trichomonas.  Final Clinical Impressions(s) / ED Diagnoses   Final diagnoses:  Pyelonephritis  Trichomonas vaginitis    ED Discharge Orders        Ordered    metroNIDAZOLE (FLAGYL) 500 MG tablet  2 times daily     05/19/17 2334    ciprofloxacin (CIPRO) 500 MG tablet  2 times daily     05/19/17 2334       Benjiman Core, MD 05/19/17 2339

## 2017-05-20 LAB — GC/CHLAMYDIA PROBE AMP (~~LOC~~) NOT AT ARMC
Chlamydia: NEGATIVE
NEISSERIA GONORRHEA: NEGATIVE

## 2017-05-20 LAB — HIV ANTIBODY (ROUTINE TESTING W REFLEX): HIV SCREEN 4TH GENERATION: NONREACTIVE

## 2017-05-20 LAB — RPR: RPR: NONREACTIVE

## 2017-05-22 LAB — URINE CULTURE: Culture: >=100000 — AB

## 2017-05-22 NOTE — Progress Notes (Signed)
Technologist Nichola SizerChelsea Martin tried to reach patient on 05-20-17 on home number no answer.  This technologist tried to reach patient on 05-22-17 on home number no answer. There is no documentation in the patient's chart if we are able to leave messages due to HIPAA law.

## 2017-05-22 NOTE — Progress Notes (Signed)
This technologist [name]

## 2017-05-23 ENCOUNTER — Telehealth: Payer: Self-pay | Admitting: *Deleted

## 2017-05-23 NOTE — Progress Notes (Signed)
Left message for patient to return call to CT department in regards to extravasation on 05/19/17.

## 2017-05-23 NOTE — Telephone Encounter (Signed)
Post ED Visit - Positive Culture Follow-up  Culture report reviewed by antimicrobial stewardship pharmacist:  []  Enzo BiNathan Batchelder, Pharm.D. []  Celedonio MiyamotoJeremy Frens, Pharm.D., BCPS AQ-ID []  Garvin FilaMike Maccia, Pharm.D., BCPS []  Georgina PillionElizabeth Martin, Pharm.D., BCPS []  BaldwinsvilleMinh Pham, [address] BoulevardPharm.D., BCPS, AAHIVP []  Estella HuskMichelle Turner, Pharm.D., BCPS, AAHIVP []  Lysle Pearlachel Rumbarger, PharmD, BCPS []  Blake DivineShannon Parkey, PharmD []  Pollyann SamplesAndy Johnston, PharmD, BCPS Adline PotterSabrina Dunham  Positive urine culture Treated with Ciprofloxacin HCL, organism sensitive to the same and no further patient follow-up is required at this time.  Virl AxeRobertson, Kendrick Remigio [facility] 05/23/2017, 9:50 AM

## 2017-12-18 DIAGNOSIS — Z124 Encounter for screening for malignant neoplasm of cervix: Secondary | ICD-10-CM | POA: Diagnosis not present

## 2017-12-18 DIAGNOSIS — Z30432 Encounter for removal of intrauterine contraceptive device: Secondary | ICD-10-CM | POA: Diagnosis not present

## 2017-12-18 DIAGNOSIS — Z3202 Encounter for pregnancy test, result negative: Secondary | ICD-10-CM | POA: Diagnosis not present

## 2017-12-25 DIAGNOSIS — Z30430 Encounter for insertion of intrauterine contraceptive device: Secondary | ICD-10-CM | POA: Diagnosis not present

## 2017-12-25 DIAGNOSIS — Z3202 Encounter for pregnancy test, result negative: Secondary | ICD-10-CM | POA: Diagnosis not present

## 2019-08-11 DIAGNOSIS — Z23 Encounter for immunization: Secondary | ICD-10-CM | POA: Diagnosis not present

## 2019-09-01 DIAGNOSIS — Z23 Encounter for immunization: Secondary | ICD-10-CM | POA: Diagnosis not present

## 2020-03-16 DIAGNOSIS — U071 COVID-19: Secondary | ICD-10-CM | POA: Diagnosis not present

## 2022-05-31 ENCOUNTER — Other Ambulatory Visit: Payer: Self-pay

## 2022-05-31 ENCOUNTER — Emergency Department (HOSPITAL_COMMUNITY)
Admission: EM | Admit: 2022-05-31 | Discharge: 2022-05-31 | Disposition: A | Discharge status: Home / Self Care | Payer: Medicaid Other | Attending: Emergency Medicine | Admitting: Emergency Medicine

## 2022-05-31 ENCOUNTER — Encounter (HOSPITAL_COMMUNITY): Payer: Self-pay

## 2022-05-31 DIAGNOSIS — Y92410 Unspecified street and highway as the place of occurrence of the external cause: Secondary | ICD-10-CM | POA: Insufficient documentation

## 2022-05-31 DIAGNOSIS — M545 Low back pain, unspecified: Secondary | ICD-10-CM

## 2022-05-31 DIAGNOSIS — M62830 Muscle spasm of back: Secondary | ICD-10-CM | POA: Diagnosis not present

## 2022-05-31 DIAGNOSIS — M542 Cervicalgia: Secondary | ICD-10-CM | POA: Insufficient documentation

## 2022-05-31 DIAGNOSIS — M62838 Other muscle spasm: Secondary | ICD-10-CM

## 2022-05-31 MED ORDER — LIDOCAINE 5 % EX PTCH: 1.0000 | MEDICATED_PATCH | CUTANEOUS | 0 refills | Status: DC

## 2022-05-31 MED ORDER — LIDOCAINE 5 % EX PTCH
1.0000 | MEDICATED_PATCH | CUTANEOUS | Status: DC
  Administered 2022-05-31: 1 via TRANSDERMAL
  Filled 2022-05-31: qty 1

## 2022-05-31 MED ORDER — CYCLOBENZAPRINE HCL 10 MG PO TABS
10.0000 mg | ORAL_TABLET | Freq: Once | ORAL | Status: AC
  Administered 2022-05-31: 10 mg via ORAL
  Filled 2022-05-31: qty 1

## 2022-05-31 MED ORDER — KETOROLAC TROMETHAMINE 30 MG/ML IJ SOLN
30.0000 mg | Freq: Once | INTRAMUSCULAR | Status: AC
  Administered 2022-05-31: 30 mg via INTRAMUSCULAR
  Filled 2022-05-31: qty 1

## 2022-05-31 MED ORDER — CYCLOBENZAPRINE HCL 10 MG PO TABS: 10.0000 mg | ORAL_TABLET | Freq: Two times a day (BID) | ORAL | 0 refills | Status: DC | PRN
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
## 2022-05-31 NOTE — Discharge Instructions (Signed)
Recommend 1000 mg of Tylenol every 6 hours as needed for pain, recommend 800 mg of ibuprofen every 8 hours as needed for pain.  I have written you for muscle relaxant called Flexeril to use as prescribed.  Do not mix with alcohol or drugs or dangerous activities including driving as this medication is sedating.  I recommend purchasing over-the-counter lidocaine patches or prescription lidocaine patches if able to afford them.
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
## 2022-10-05 ENCOUNTER — Other Ambulatory Visit: Payer: Self-pay

## 2022-10-05 ENCOUNTER — Inpatient Hospital Stay (HOSPITAL_COMMUNITY)
Admission: EM | Admit: 2022-10-05 | Discharge: 2022-10-07 | DRG: 872 | Disposition: A | Discharge status: Home / Self Care | Payer: Medicaid Other | Attending: Internal Medicine | Admitting: Internal Medicine

## 2022-10-05 ENCOUNTER — Emergency Department (HOSPITAL_COMMUNITY): Payer: Medicaid Other

## 2022-10-05 ENCOUNTER — Encounter (HOSPITAL_COMMUNITY): Payer: Self-pay

## 2022-10-05 DIAGNOSIS — Z23 Encounter for immunization: Secondary | ICD-10-CM

## 2022-10-05 DIAGNOSIS — I341 Nonrheumatic mitral (valve) prolapse: Secondary | ICD-10-CM | POA: Diagnosis present

## 2022-10-05 DIAGNOSIS — E119 Type 2 diabetes mellitus without complications: Secondary | ICD-10-CM

## 2022-10-05 DIAGNOSIS — N200 Calculus of kidney: Secondary | ICD-10-CM

## 2022-10-05 DIAGNOSIS — N390 Urinary tract infection, site not specified: Secondary | ICD-10-CM | POA: Diagnosis present

## 2022-10-05 DIAGNOSIS — Z833 Family history of diabetes mellitus: Secondary | ICD-10-CM | POA: Diagnosis not present

## 2022-10-05 DIAGNOSIS — Z888 Allergy status to other drugs, medicaments and biological substances status: Secondary | ICD-10-CM

## 2022-10-05 DIAGNOSIS — I5032 Chronic diastolic (congestive) heart failure: Secondary | ICD-10-CM | POA: Diagnosis present

## 2022-10-05 DIAGNOSIS — Z6838 Body mass index (BMI) 38.0-38.9, adult: Secondary | ICD-10-CM | POA: Diagnosis not present

## 2022-10-05 DIAGNOSIS — R1032 Left lower quadrant pain: Secondary | ICD-10-CM | POA: Diagnosis not present

## 2022-10-05 DIAGNOSIS — N132 Hydronephrosis with renal and ureteral calculous obstruction: Secondary | ICD-10-CM | POA: Diagnosis not present

## 2022-10-05 DIAGNOSIS — N12 Tubulo-interstitial nephritis, not specified as acute or chronic: Secondary | ICD-10-CM | POA: Diagnosis not present

## 2022-10-05 DIAGNOSIS — A6009 Herpesviral infection of other urogenital tract: Secondary | ICD-10-CM | POA: Diagnosis present

## 2022-10-05 DIAGNOSIS — Z87891 Personal history of nicotine dependence: Secondary | ICD-10-CM

## 2022-10-05 DIAGNOSIS — E785 Hyperlipidemia, unspecified: Secondary | ICD-10-CM | POA: Diagnosis not present

## 2022-10-05 DIAGNOSIS — E669 Obesity, unspecified: Secondary | ICD-10-CM | POA: Diagnosis not present

## 2022-10-05 DIAGNOSIS — K429 Umbilical hernia without obstruction or gangrene: Secondary | ICD-10-CM | POA: Diagnosis not present

## 2022-10-05 DIAGNOSIS — E876 Hypokalemia: Secondary | ICD-10-CM

## 2022-10-05 DIAGNOSIS — Z79899 Other long term (current) drug therapy: Secondary | ICD-10-CM

## 2022-10-05 DIAGNOSIS — F419 Anxiety disorder, unspecified: Secondary | ICD-10-CM | POA: Diagnosis present

## 2022-10-05 DIAGNOSIS — A419 Sepsis, unspecified organism: Principal | ICD-10-CM

## 2022-10-05 DIAGNOSIS — K5900 Constipation, unspecified: Secondary | ICD-10-CM | POA: Diagnosis not present

## 2022-10-05 DIAGNOSIS — Z975 Presence of (intrauterine) contraceptive device: Secondary | ICD-10-CM

## 2022-10-05 DIAGNOSIS — N136 Pyonephrosis: Secondary | ICD-10-CM | POA: Diagnosis not present

## 2022-10-05 DIAGNOSIS — B964 Proteus (mirabilis) (morganii) as the cause of diseases classified elsewhere: Secondary | ICD-10-CM | POA: Diagnosis present

## 2022-10-05 DIAGNOSIS — K5730 Diverticulosis of large intestine without perforation or abscess without bleeding: Secondary | ICD-10-CM | POA: Diagnosis not present

## 2022-10-05 DIAGNOSIS — R7989 Other specified abnormal findings of blood chemistry: Secondary | ICD-10-CM | POA: Diagnosis present

## 2022-10-05 DIAGNOSIS — I110 Hypertensive heart disease with heart failure: Secondary | ICD-10-CM | POA: Diagnosis not present

## 2022-10-05 LAB — CBC WITH DIFFERENTIAL/PLATELET
Abs Immature Granulocytes: 0.05 10*3/uL (ref 0.00–0.07)
Basophils Absolute: 0.1 10*3/uL (ref 0.0–0.1)
Basophils Relative: 1 %
Eosinophils Absolute: 0.2 10*3/uL (ref 0.0–0.5)
Eosinophils Relative: 1 %
HCT: 41.6 % (ref 36.0–46.0)
Hemoglobin: 14.1 g/dL (ref 12.0–15.0)
Immature Granulocytes: 1 %
Lymphocytes Relative: 15 %
Lymphs Abs: 1.7 10*3/uL (ref 0.7–4.0)
MCH: 31.4 pg (ref 26.0–34.0)
MCHC: 33.9 g/dL (ref 30.0–36.0)
MCV: 92.7 fL (ref 80.0–100.0)
Monocytes Absolute: 1.9 10*3/uL — ABNORMAL HIGH (ref 0.1–1.0)
Monocytes Relative: 17 %
Neutro Abs: 7.3 10*3/uL (ref 1.7–7.7)
Neutrophils Relative %: 65 %
Platelets: 294 10*3/uL (ref 150–400)
RBC: 4.49 MIL/uL (ref 3.87–5.11)
RDW: 12.5 % (ref 11.5–15.5)
WBC: 11.1 10*3/uL — ABNORMAL HIGH (ref 4.0–10.5)
nRBC: 0 % (ref 0.0–0.2)

## 2022-10-05 LAB — COMPREHENSIVE METABOLIC PANEL
ALT: 54 U/L — ABNORMAL HIGH (ref 0–44)
AST: 44 U/L — ABNORMAL HIGH (ref 15–41)
Albumin: 3.3 g/dL — ABNORMAL LOW (ref 3.5–5.0)
Alkaline Phosphatase: 136 U/L — ABNORMAL HIGH (ref 38–126)
Anion gap: 10 (ref 5–15)
BUN: 10 mg/dL (ref 6–20)
CO2: 27 mmol/L (ref 22–32)
Calcium: 8.6 mg/dL — ABNORMAL LOW (ref 8.9–10.3)
Chloride: 100 mmol/L (ref 98–111)
Creatinine, Ser: 0.75 mg/dL (ref 0.44–1.00)
GFR, Estimated: >60 mL/min (ref >60)
Glucose, Bld: 100 mg/dL — ABNORMAL HIGH (ref 70–99)
Potassium: 3.3 mmol/L — ABNORMAL LOW (ref 3.5–5.1)
Sodium: 137 mmol/L (ref 135–145)
Total Bilirubin: 0.9 mg/dL (ref 0.3–1.2)
Total Protein: 7.2 g/dL (ref 6.5–8.1)

## 2022-10-05 LAB — URINALYSIS, ROUTINE W REFLEX MICROSCOPIC
Bilirubin Urine: NEGATIVE
Glucose, UA: NEGATIVE mg/dL
Hgb urine dipstick: NEGATIVE
Ketones, ur: NEGATIVE mg/dL
Nitrite: NEGATIVE
Protein, ur: 100 mg/dL — AB
Specific Gravity, Urine: 1.02 (ref 1.005–1.030)
WBC, UA: >50 WBC/hpf (ref 0–5)
pH: 8 (ref 5.0–8.0)

## 2022-10-05 LAB — PREGNANCY, URINE: Preg Test, Ur: NEGATIVE

## 2022-10-05 LAB — LIPASE, BLOOD: Lipase: 51 U/L (ref 11–51)

## 2022-10-05 MED ORDER — MORPHINE SULFATE (PF) 4 MG/ML IV SOLN
4.0000 mg | Freq: Once | INTRAVENOUS | Status: AC
  Administered 2022-10-05: 4 mg via INTRAVENOUS
  Filled 2022-10-05: qty 1

## 2022-10-05 MED ORDER — LACTATED RINGERS IV SOLN: INTRAVENOUS | Status: DC

## 2022-10-05 MED ORDER — ACETAMINOPHEN 500 MG PO TABS
1000.0000 mg | ORAL_TABLET | Freq: Once | ORAL | Status: AC
  Administered 2022-10-05: 1000 mg via ORAL
  Filled 2022-10-05: qty 2

## 2022-10-05 MED ORDER — SODIUM CHLORIDE 0.9 % IV SOLN
1.0000 g | Freq: Once | INTRAVENOUS | Status: AC
  Administered 2022-10-05: 1 g via INTRAVENOUS
  Filled 2022-10-05: qty 10

## 2022-10-05 MED ORDER — ACETAMINOPHEN 325 MG PO TABS
650.0000 mg | ORAL_TABLET | Freq: Four times a day (QID) | ORAL | Status: DC | PRN
  Administered 2022-10-06: 650 mg via ORAL
  Filled 2022-10-05: qty 2

## 2022-10-05 MED ORDER — SODIUM CHLORIDE 0.9 % IV SOLN
1.0000 g | INTRAVENOUS | Status: DC
  Administered 2022-10-06 – 2022-10-07 (×2): 1 g via INTRAVENOUS
  Filled 2022-10-05 (×2): qty 10

## 2022-10-05 MED ORDER — ONDANSETRON HCL 4 MG/2ML IJ SOLN
4.0000 mg | Freq: Once | INTRAMUSCULAR | Status: AC
  Administered 2022-10-05: 4 mg via INTRAVENOUS
  Filled 2022-10-05: qty 2

## 2022-10-05 MED ORDER — POTASSIUM CHLORIDE CRYS ER 20 MEQ PO TBCR
40.0000 meq | EXTENDED_RELEASE_TABLET | Freq: Once | ORAL | Status: AC
  Administered 2022-10-05: 40 meq via ORAL
  Filled 2022-10-05: qty 2

## 2022-10-05 MED ORDER — ONDANSETRON HCL 4 MG/2ML IJ SOLN: 4.0000 mg | Freq: Four times a day (QID) | INTRAMUSCULAR | Status: DC | PRN

## 2022-10-05 MED ORDER — IOHEXOL 350 MG/ML SOLN
75.0000 mL | Freq: Once | INTRAVENOUS | Status: AC | PRN
  Administered 2022-10-05: 75 mL via INTRAVENOUS

## 2022-10-05 MED ORDER — MORPHINE SULFATE (PF) 2 MG/ML IV SOLN
2.0000 mg | INTRAVENOUS | Status: DC | PRN
  Administered 2022-10-06 (×4): 2 mg via INTRAVENOUS
  Filled 2022-10-05 (×4): qty 1

## 2022-10-05 MED ORDER — PNEUMOCOCCAL VAC POLYVALENT 25 MCG/0.5ML IJ INJ
0.5000 mL | INJECTION | INTRAMUSCULAR | Status: AC
  Administered 2022-10-07: 0.5 mL via INTRAMUSCULAR
  Filled 2022-10-05: qty 0.5

## 2022-10-05 NOTE — Assessment & Plan Note (Signed)
-  mild at 3.3 -will administer oral potassium

## 2022-10-05 NOTE — ED Provider Notes (Signed)
[place] EMERGENCY DEPARTMENT AT [facility] Provider Note   CSN: [id number] Arrival date & time: 10/05/22  1532     History  Chief Complaint  Patient presents with   Abdominal Pain    [name] is a 37 y.o. female.  Patient is a healthy 37 year old female presenting today with a 6-day history of pain in the left lower quadrant that has been gradually getting worse.  Decreased appetite but no significant nausea or vomiting.  She has not had any diarrhea and symptoms did not improve after a bowel movement.  She has noticed her urine looked dark but is not had any dysuria, frequency or urgency.  She started having fevers intermittently for the last 2 days.  She does report history of kidney infections before but denies history of kidney stones.  Only abdominal surgery was C-sections.  She has an IUD and last menses was beginning of this month.  She denies any chest pain or shortness of breath.  The history is provided by the patient.  Abdominal Pain      Home Medications Prior to Admission medications   Medication Sig Start Date End Date Taking? Authorizing Provider  cetirizine (ZYRTEC) 10 MG tablet Take 10 mg by mouth daily as needed for allergies.    [provider]  ciprofloxacin (CIPRO) 500 MG tablet Take 1 tablet (500 mg total) by mouth 2 (two) times daily. 05/19/17   Benjiman Core, MD  clindamycin (CLEOCIN) 300 MG capsule Take 1 capsule (300 mg total) by mouth every 6 (six) hours. Patient not taking: Reported on 05/19/2017 10/04/16   Elson Areas, PA-C  cyclobenzaprine (FLEXERIL) 10 MG tablet Take 1 tablet (10 mg total) by mouth 2 (two) times daily as needed for muscle spasms. 05/31/22   Curatolo, Adam, DO  FLUoxetine (PROZAC) 20 MG capsule Take 20 mg by mouth daily.    [provider]  HYDROcodone-acetaminophen (NORCO/VICODIN) 5-325 MG tablet Take 2 tablets by mouth every 4 (four) hours as needed. Patient not taking: Reported on  05/19/2017 10/04/16   Elson Areas, PA-C  lansoprazole (PREVACID) 30 MG capsule Take 1 capsule (30 mg total) by mouth daily at 12 noon. Patient not taking: Reported on 05/19/2017 11/27/13   Loren Racer, MD  levonorgestrel (MIRENA) 20 MCG/24HR IUD 1 each by Intrauterine route once.    [provider]  lidocaine (LIDODERM) 5 % Place 1 patch onto the skin daily. Remove & Discard patch within 12 hours or as directed by MD 05/31/22   Virgina Norfolk, DO  metroNIDAZOLE (FLAGYL) 500 MG tablet Take 1 tablet (500 mg total) by mouth 2 (two) times daily. 05/19/17   Benjiman Core, MD  Multiple Vitamin (MULTIVITAMIN WITH MINERALS) TABS tablet Take 1 tablet by mouth daily.    [provider]      Allergies    Mucinex [guaifenesin er]    Review of Systems   Review of Systems  Gastrointestinal:  Positive for abdominal pain.    Physical Exam Updated Vital Signs BP 124/89   Pulse 80   Temp (!) 102.3 F (39.1 C) (Oral)   Resp 16   Ht 4\' 10"  (1.473 m)   Wt 83.9 kg   SpO2 100%   BMI 38.66 kg/m  Physical Exam Vitals and nursing note reviewed.  Constitutional:      General: She is not in acute distress.    Appearance: She is well-developed.  HENT:     Head: Normocephalic and atraumatic.  Eyes:  Conjunctiva/sclera: Conjunctivae normal.     Pupils: Pupils are equal, round, and reactive to light.  Cardiovascular:     Rate and Rhythm: Normal rate and regular rhythm.     Heart sounds: No murmur heard. Pulmonary:     Effort: Pulmonary effort is normal. No respiratory distress.     Breath sounds: Normal breath sounds. No wheezing or rales.  Abdominal:     General: There is no distension.     Palpations: Abdomen is soft.     Tenderness: There is abdominal tenderness in the left lower quadrant. There is no right CVA tenderness, left CVA tenderness, guarding or rebound.  Musculoskeletal:        General: No tenderness. Normal range of motion.     Cervical back: Normal range  of motion and neck supple.  Skin:    General: Skin is warm and dry.     Findings: No erythema or rash.  Neurological:     Mental Status: She is alert and oriented to person, place, and time.  Psychiatric:        Behavior: Behavior normal.     ED Results / Procedures / Treatments   Labs (all labs ordered are listed, but only abnormal results are displayed) Labs Reviewed  CBC WITH DIFFERENTIAL/PLATELET - Abnormal; Notable for the following components:      Result Value   WBC 11.1 (*)    Monocytes Absolute 1.9 (*)    All other components within normal limits  COMPREHENSIVE METABOLIC PANEL - Abnormal; Notable for the following components:   Potassium 3.3 (*)    Glucose, Bld 100 (*)    Calcium 8.6 (*)    Albumin 3.3 (*)    AST 44 (*)    ALT 54 (*)    Alkaline Phosphatase 136 (*)    All other components within normal limits  URINALYSIS, ROUTINE W REFLEX MICROSCOPIC - Abnormal; Notable for the following components:   Color, Urine AMBER (*)    APPearance CLOUDY (*)    Protein, ur 100 (*)    Leukocytes,Ua LARGE (*)    Bacteria, UA FEW (*)    All other components within normal limits  URINE CULTURE  LIPASE, BLOOD  PREGNANCY, URINE    EKG None  Radiology CT ABDOMEN PELVIS W CONTRAST  Result Date: 10/05/2022 CLINICAL DATA:  Left lower quadrant abdominal pain. EXAM: CT ABDOMEN AND PELVIS WITH CONTRAST TECHNIQUE: Multidetector CT imaging of the abdomen and pelvis was performed using the standard protocol following bolus administration of intravenous contrast. RADIATION DOSE REDUCTION: This exam was performed according to the departmental dose-optimization program which includes automated exposure control, adjustment of the mA and/or kV according to patient size and/or use of iterative reconstruction technique. CONTRAST:  75mL OMNIPAQUE IOHEXOL 350 MG/ML SOLN COMPARISON:  Noncontrast CT 05/19/2017 FINDINGS: Lower chest: Mild cardiomegaly. No basilar airspace disease or pleural  effusion. Hepatobiliary: Motion artifact through the liver and gallbladder. Allowing for this, no acute findings. No calcified gallstone. No biliary dilatation. Pancreas: Motion artifact through the pancreas. Allowing for this, no acute findings. No ductal dilatation or inflammation. Spleen: Motion artifact through the spleen. Allowing for this, no focal abnormality. No splenomegaly. Adrenals/Urinary Tract: Motion artifact through the adrenal glands and kidneys. Allowing for this, no adrenal nodule. 18 mm stone in the left proximal ureter just beyond the ureteropelvic junction with mild left hydronephrosis. There is heterogeneous enhancement of the left kidney, no discrete fluid collection. The ureter distal to the proximal ureteral stone is decompressed. No  definite additional intrarenal calculi within either kidney. There is no right hydronephrosis. Partially distended urinary bladder is normal for degree of distension. Stomach/Bowel: Motion artifact limits detailed bowel assessment. The stomach is nondistended. No bowel obstruction or inflammation. Normal appendix visualized. Small volume of stool in the colon. Occasional sigmoid diverticula without diverticulitis. Vascular/Lymphatic: Normal caliber abdominal aorta. The portal vein is patent. Adenopathy assessment is limited given motion. Reproductive: Intrauterine device in the uterus, grossly normally position by CT. No adnexal mass. Other: No free air, free fluid, or intra-abdominal fluid collection. Small fat containing umbilical hernia. Musculoskeletal: There are no acute or suspicious osseous abnormalities. Mild rightward curvature of the lumbar spine pain IMPRESSION: 1. Obstructing 18 mm stone in the left proximal ureter just beyond the ureteropelvic junction with mild left hydronephrosis. 2. Minimal sigmoid colonic diverticulosis without diverticulitis. 3. Small fat containing umbilical hernia. 4. Motion limited exam. Electronically Signed   By: Narda Rutherford M.D.   On: 10/05/2022 19:52    Procedures Procedures    Medications Ordered in ED Medications  cefTRIAXone (ROCEPHIN) 1 g in sodium chloride 0.9 % 100 mL IVPB (1 g Intravenous New Bag/Given 10/05/22 2056)  iohexol (OMNIPAQUE) 350 MG/ML injection 75 mL (75 mLs Intravenous Contrast Given 10/05/22 1941)  acetaminophen (TYLENOL) tablet 1,000 mg (1,000 mg Oral Given 10/05/22 2055)  morphine (PF) 4 MG/ML injection 4 mg (4 mg Intravenous Given 10/05/22 2054)  ondansetron (ZOFRAN) injection 4 mg (4 mg Intravenous Given 10/05/22 2051)    ED Course/ Medical Decision Making/ A&P                             Medical Decision Making Amount and/or Complexity of Data Reviewed Labs: ordered. Decision-making details documented in ED Course. Radiology: ordered and independent interpretation performed. Decision-making details documented in ED Course.  Risk OTC drugs. Prescription drug management. Decision regarding hospitalization.   Pt presenting today with a complaint that caries a high risk for morbidity and mortality.  Here today with abdominal pain concerning for diverticulitis, renal stone versus pyelonephritis.  Lower suspicion for TOA, ectopic pregnancy or ovarian cyst.  No findings to suggest torsion at this time.  I independently interpreted patient's labs and urine pregnancy test is negative, CBC with mild leukocytosis of 11, normal hemoglobin and CMP with mildly elevated LFTs today AST of 44 and ALT of 54 but normal renal function.  Lipase within normal limits, UA with large leukocytes and greater than 50 white blood cells with few bacteria and some contamination.  CT pending for further evaluation.  9:13 PM CT showing a obstructing stone on the left side.  Radiology reports an 18 mm stone with hydronephrosis.  Spoke with Dr. Ronne Binning with urology who recommended that patient need a nephrostomy tube.  She was started on IV antibiotics, spoke with Dr. Archer Asa with IR who they will plan  on placing a tube tomorrow and patient needs to be n.p.o. after midnight and no anticoagulants.  Also patient will require admission and the hospitalist was consulted for admission.  Patient is comfortable with this plan.        Final Clinical Impression(s) / ED Diagnoses Final diagnoses:  Pyelonephritis  Kidney stone    Rx / DC Orders ED Discharge Orders     None         Gwyneth Sprout, MD 10/05/22 2113

## 2022-10-05 NOTE — Assessment & Plan Note (Signed)
-  appears chronically elevated, stable

## 2022-10-05 NOTE — H&P (Signed)
History and Physical    Patient: [name] ZOX:[id number] DOB: September 24, 1985 DOA: 10/05/2022 DOS: the patient was seen and examined on 10/05/2022 PCP: Norm Salt, PA  Patient coming from: Home  Chief Complaint:  Chief Complaint  Patient presents with   Abdominal Pain   HPI: [name] is a 37 y.o. 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   Had left sided abdominal pain and left flank pain for about 5 days. Thought initially was due to constipation and took laxative 2 days ago. Abdominal pain improved following bowel movement.  Intermittent fever for the past 3 days. Had nausea earlier. No dysuria but had increase frequency.   In ED she was febrile to 102.72F, HR 80. Leukocytosis of 11.1. CT abd/pelvis revealing for 18 mm obstructing stone in the left proximal ureter just beyond the ureteropelvic junction with mild left hydronephrosis.  UA grossly positive. Creatinine normal at 0.7. ED physician discussed with urology Dr. Ronne Binning who did not recommend any urgent intervention overnight and recommends nephrostomy tube with IR in the morning.  She was started on IV Rocephin and hospitalist consulted for admission.    Review of Systems: As mentioned in the history of present illness. All other systems reviewed and are negative. Past Medical History:  Diagnosis Date   Abnormal Pap smear    Anxiety    on meds   CHF (congestive heart failure) (HCC)    DM2 (diabetes mellitus, type 2) (HCC)    Gestational diabetes    Healthcare-associated pneumonia 10/31/2012   Herpes genitalia    HSV (herpes simplex virus) infection    last outbreak in may   Hyperlipidemia    Hypertension    Infection    UTI   Mitral valve prolapse    Obesity    Status post repair of cor triatriatum    Echocardiogram 12/05: EF 55-65%, no mitral valve prolapse, mild RVE and no mention  of any residual ASD.     Past Surgical History:  Procedure Laterality Date   CARDIAC SURGERY     congenital heart defect repair at 81 mos   CESAREAN SECTION N/A 10/06/2012   Procedure: Primary CESAREAN SECTION  of baby girl at 0347 APGAR 8/8;  Surgeon: Philip Aspen, DO;  Location: [facility];  Service: Obstetrics;  Laterality: N/A;   EXPLORATION POST OPERATIVE OPEN HEART     LAPAROTOMY N/A 10/07/2012   Procedure: EXPLORATORY LAPAROTOMY;  Surgeon: Freddrick March. Tenny Craw, MD;  Location: [facility];  Service: Gynecology;  Laterality: N/A;   Social History:  reports that she has quit smoking. Her smoking use included cigarettes. She has never used smokeless tobacco. She reports current alcohol use. She reports that she does not use drugs.  Allergies  Allergen Reactions   Mucinex [Guaifenesin Er] Shortness Of Breath    Family History  Problem Relation Age of Onset   Diabetes Mother    Asthma Mother    Diabetes Father    Asthma Father    Diabetes Maternal Grandmother    Diabetes Paternal Grandfather     Prior to Admission medications   Medication Sig Start Date End Date Taking? Authorizing Provider  cetirizine (ZYRTEC) 10 MG tablet Take 10 mg by mouth daily as needed for allergies.    [provider]  ciprofloxacin (CIPRO) 500 MG tablet Take 1 tablet (500 mg total) by mouth 2 (two) times daily. 05/19/17   Benjiman Core, MD  clindamycin (CLEOCIN) 300 MG capsule Take 1 capsule (300 mg total) by mouth every 6 (six) hours. Patient not taking: Reported on 05/19/2017 10/04/16   Elson Areas, PA-C  cyclobenzaprine (FLEXERIL) 10 MG tablet Take 1 tablet (10 mg total) by mouth 2 (two) times daily as needed for muscle spasms. 05/31/22   Curatolo, Adam, DO  FLUoxetine (PROZAC) 20 MG capsule Take 20 mg by mouth daily.    [provider]  HYDROcodone-acetaminophen (NORCO/VICODIN) 5-325 MG tablet Take 2 tablets by mouth every 4 (four) hours as needed. Patient not taking: Reported on 05/19/2017  10/04/16   Elson Areas, PA-C  lansoprazole (PREVACID) 30 MG capsule Take 1 capsule (30 mg total) by mouth daily at 12 noon. Patient not taking: Reported on 05/19/2017 11/27/13   Loren Racer, MD  levonorgestrel (MIRENA) 20 MCG/24HR IUD 1 each by Intrauterine route once.    [provider]  lidocaine (LIDODERM) 5 % Place 1 patch onto the skin daily. Remove & Discard patch within 12 hours or as directed by MD 05/31/22   Virgina Norfolk, DO  metroNIDAZOLE (FLAGYL) 500 MG tablet Take 1 tablet (500 mg total) by mouth 2 (two) times daily. 05/19/17   Benjiman Core, MD  Multiple Vitamin (MULTIVITAMIN WITH MINERALS) TABS tablet Take 1 tablet by mouth daily.    [provider]    Physical Exam: Vitals:   10/05/22 1612 10/05/22 1625 10/05/22 1852 10/05/22 2018  BP: (!) 132/91  124/89   Pulse: 82  80   Resp: 14  16   Temp: 98.4 F (36.9 C)   (!) 102.3 F (39.1 C)  TempSrc: Oral   Oral  SpO2: 96%  100%   Weight:  83.9 kg    Height:  4\' 10"  (1.473 m)     Constitutional: NAD, calm, comfortable, mildly ill appearing young female sitting upright in bed Eyes: lids and conjunctivae normal ENMT: Mucous membranes are moist.  Neck: normal, supple Respiratory: clear to auscultation bilaterally, no wheezing, no crackles. Normal respiratory effort. No accessory muscle use.  Cardiovascular: Regular rate and rhythm, no murmurs / rubs / gallops. No extremity edema.  Abdomen: soft,no tenderness, non-distended. No guarding, rebound tenderness or rigidity- exam is follow receive of IV opioids for pain control. Bowel sounds positive.  Musculoskeletal: no clubbing / cyanosis. No joint deformity upper and lower extremities. Normal muscle tone.  Skin: no rashes, lesions, ulcers. No induration Neurologic: CN 2-12 grossly intact.   Psychiatric: Normal judgment and insight. Alert and oriented x 3. Normal mood.   Data Reviewed:  See HPI  Assessment and Plan: * Sepsis (HCC) -secondary to  pyelonephritis from obstructing left ureter stone with fever and leukocytosis. However no urgent intervention per urology Dr. Ronne Binning. She is currently well appearing and hemodynamically stable.  -IR consulted by EDP for nephrostomy tube tomorrow. Urology will formally consult in the morning.  -Continue IV Rocephin  Hydronephrosis with obstructing calculus -CT abd/pelvis revealing for 18 mm obstructing stone in the left proximal ureter just beyond the ureteropelvic junction with mild left hydronephrosis.  UA grossly positive. Creatinine normal at 0.7.  -ED physician discussed with urology Dr. Ronne Binning who did not recommend any urgent intervention overnight and recommends nephrostomy tube with IR in the morning. IR has been consulted by EDP. NPO past midnight   Type 2 diabetes mellitus (HCC) -no recent HbA1C -no current hyperglycemia  Hypokalemia -mild at 3.3 -will administer oral potassium  Elevated LFTs -appears chronically elevated, stable      Advance  Care Planning: Full  Consults: Urology, interventional radiology  Family Communication: none at bedside  Severity of Illness: The appropriate patient status for this patient is INPATIENT. Inpatient status is judged to be reasonable and necessary in order to provide the required intensity of service to ensure the patient's safety. The patient's presenting symptoms, physical exam findings, and initial radiographic and laboratory data in the context of their chronic comorbidities is felt to place them at high risk for further clinical deterioration. Furthermore, it is not anticipated that the patient will be medically stable for discharge from the hospital within 2 midnights of admission.   * I certify that at the point of admission it is my clinical judgment that the patient will require inpatient hospital care spanning beyond 2 midnights from the point of admission due to high intensity of service, high risk for further deterioration  and high frequency of surveillance required.*  Author: Anselm Jungling, DO 10/05/2022 9:43 PM  For on call review [email or web address].

## 2022-10-05 NOTE — Assessment & Plan Note (Signed)
-  CT abd/pelvis revealing for 18 mm obstructing stone in the left proximal ureter just beyond the ureteropelvic junction with mild left hydronephrosis.  UA grossly positive. Creatinine normal at 0.7.  -ED physician discussed with urology Dr. Ronne Binning who did not recommend any urgent intervention overnight and recommends nephrostomy tube with IR in the morning. IR has been consulted by EDP. NPO past midnight

## 2022-10-05 NOTE — ED Triage Notes (Signed)
Pt c/o LLQ abdominal pain and feverx5d. Pt was constipated until she received laxative and it helped her have a BM, but still has the pain. Pt c/o N/V. Pt states the highest her fever went up to was 100.3

## 2022-10-05 NOTE — Assessment & Plan Note (Addendum)
-  secondary to pyelonephritis from obstructing left ureter stone with fever and leukocytosis. However no urgent intervention per urology Dr. Ronne Binning. She is currently well appearing and hemodynamically stable.  -IR consulted by EDP for nephrostomy tube tomorrow. Urology will formally consult in the morning.  -Continue IV Rocephin

## 2022-10-05 NOTE — ED Provider Triage Note (Signed)
Emergency Medicine Provider Triage Evaluation Note  [name] , a 37 y.o. female  was evaluated in triage.  Pt complains of left lower quadrant abdominal pain.  Onset last weekend.  Has been progressively worsening since Sunday.  She thought she might have gas.  She took a laxative without relief.  She began running fevers.  Pain is worse with movement and laughing.  Review of Systems  Positive: Left lower quadrant abdominal pain Negative: Fever  Physical Exam  BP (!) 132/91 (BP Location: Right Arm)   Pulse 82   Temp 98.4 F (36.9 C) (Oral)   Resp 14   Ht 4\' 10"  (1.473 m)   Wt 83.9 kg   SpO2 96%   BMI 38.66 kg/m  Gen:   Awake, no distress   Resp:  Normal effort  MSK:   Moves extremities without difficulty  Other:  TTP LLQ  Medical Decision Making  Medically screening exam initiated at 4:45 PM.  Appropriate orders placed.  [name] was informed that the remainder of the evaluation will be completed by another provider, this initial triage assessment does not replace that evaluation, and the importance of remaining in the ED until their evaluation is complete.     Arthor Captain, PA-C 10/05/22 1646

## 2022-10-05 NOTE — ED Notes (Signed)
ED TO INPATIENT HANDOFF REPORT  ED Nurse Name and Phone #: Brookes Craine [phone]  S Name/Age/Gender [name] 37 y.o. female Room/Bed: 003C/003C  Code Status   Code Status: Full Code  Home/SNF/Other Home Patient oriented to: self, place, time, and situation Is this baseline? Yes   Triage Complete: Triage complete  Chief Complaint Sepsis [facility]) [A41.9]  Triage Note Pt c/o LLQ abdominal pain and feverx5d. Pt was constipated until she received laxative and it helped her have a BM, but still has the pain. Pt c/o N/V. Pt states the highest her fever went up to was 100.3   Allergies Allergies  Allergen Reactions   Mucinex [Guaifenesin Er] Shortness Of Breath    Level of Care/Admitting Diagnosis ED Disposition     ED Disposition  Admit   Condition  --   Comment  Hospital Area: [facility] [[id number]]  Level of Care: Telemetry Medical [104]  May admit patient to [facility] or [facility] if equivalent level of care is available:: No  Covid Evaluation: Asymptomatic - no recent exposure (last 10 days) testing not required  Diagnosis: Sepsis [facility]) [[id number]]  Admitting Physician: Anselm Jungling [[id number]]  Attending Physician: Anselm Jungling [[id number]]  Certification:: I certify this patient will need inpatient services for at least 2 midnights  Estimated Length of Stay: 3          B Medical/Surgery History Past Medical History:  Diagnosis Date   Abnormal Pap smear    Anxiety    on meds   CHF (congestive heart failure) (HCC)    DM2 (diabetes mellitus, type 2) (HCC)    Gestational diabetes    Healthcare-associated pneumonia 10/31/2012   Herpes genitalia    HSV (herpes simplex virus) infection    last outbreak in may   Hyperlipidemia    Hypertension    Infection    UTI   Mitral valve prolapse    Obesity    Status post repair of cor triatriatum    Echocardiogram 12/05: EF 55-65%, no mitral valve prolapse, mild RVE and no mention of any residual ASD.      Past Surgical History:  Procedure Laterality Date   CARDIAC SURGERY     congenital heart defect repair at 80 mos   CESAREAN SECTION N/A 10/06/2012   Procedure: Primary CESAREAN SECTION  of baby girl at 0347 APGAR 8/8;  Surgeon: Philip Aspen, DO;  Location: [facility];  Service: Obstetrics;  Laterality: N/A;   EXPLORATION POST OPERATIVE OPEN HEART     LAPAROTOMY N/A 10/07/2012   Procedure: EXPLORATORY LAPAROTOMY;  Surgeon: Freddrick March. Tenny Craw, MD;  Location: [facility];  Service: Gynecology;  Laterality: N/A;     A IV Location/Drains/Wounds Patient Lines/Drains/Airways Status     Active Line/Drains/Airways     Name Placement date Placement time Site Days   Peripheral IV 10/05/22 20 G 1.16" Left Antecubital 10/05/22  1848  Antecubital  less than 1            Intake/Output Last 24 hours No intake or output data in the 24 hours ending 10/05/22 2155  Labs/Imaging Results for orders placed or performed during the hospital encounter of 10/05/22 (from the past 48 hour(s))  CBC with Differential     Status: Abnormal   Collection Time: 10/05/22  4:56 PM  Result Value Ref Range   WBC 11.1 (H) 4.0 - 10.5 K/uL   RBC 4.49 3.87 - 5.11 MIL/uL   Hemoglobin 14.1 12.0 - 15.0 g/dL  HCT 41.6 36.0 - 46.0 %   MCV 92.7 80.0 - 100.0 fL   MCH 31.4 26.0 - 34.0 pg   MCHC 33.9 30.0 - 36.0 g/dL   RDW 27.2 53.6 - 64.4 %   Platelets 294 150 - 400 K/uL   nRBC 0.0 0.0 - 0.2 %   Neutrophils Relative % 65 %   Neutro Abs 7.3 1.7 - 7.7 K/uL   Lymphocytes Relative 15 %   Lymphs Abs 1.7 0.7 - 4.0 K/uL   Monocytes Relative 17 %   Monocytes Absolute 1.9 (H) 0.1 - 1.0 K/uL   Eosinophils Relative 1 %   Eosinophils Absolute 0.2 0.0 - 0.5 K/uL   Basophils Relative 1 %   Basophils Absolute 0.1 0.0 - 0.1 K/uL   Immature Granulocytes 1 %   Abs Immature Granulocytes 0.05 0.00 - 0.07 K/uL    Comment: Performed at [facility], [address] [place], [place] [address]  Comprehensive metabolic panel     Status:  Abnormal   Collection Time: 10/05/22  4:56 PM  Result Value Ref Range   Sodium 137 135 - 145 mmol/L   Potassium 3.3 (L) 3.5 - 5.1 mmol/L   Chloride 100 98 - 111 mmol/L   CO2 27 22 - 32 mmol/L   Glucose, Bld 100 (H) 70 - 99 mg/dL    Comment: Glucose reference range applies only to samples taken after fasting for at least 8 hours.   BUN 10 6 - 20 mg/dL   Creatinine, Ser 2.59 0.44 - 1.00 mg/dL   Calcium 8.6 (L) 8.9 - 10.3 mg/dL   Total Protein 7.2 6.5 - 8.1 g/dL   Albumin 3.3 (L) 3.5 - 5.0 g/dL   AST 44 (H) 15 - 41 U/L   ALT 54 (H) 0 - 44 U/L   Alkaline Phosphatase 136 (H) 38 - 126 U/L   Total Bilirubin 0.9 0.3 - 1.2 mg/dL   GFR, Estimated >56 >38 mL/min    Comment: (NOTE) Calculated using the CKD-EPI Creatinine Equation (2021)    Anion gap 10 5 - 15    Comment: Performed at [facility], [address], [place], [place] [address]  Lipase, blood     Status: None   Collection Time: 10/05/22  4:56 PM  Result Value Ref Range   Lipase 51 11 - 51 U/L    Comment: Performed at [facility], [address] [place], [place] [address]  Urinalysis, Routine w reflex microscopic -Urine, Clean Catch     Status: Abnormal   Collection Time: 10/05/22  6:45 PM  Result Value Ref Range   Color, Urine AMBER (A) YELLOW    Comment: BIOCHEMICALS MAY BE AFFECTED BY COLOR   APPearance CLOUDY (A) CLEAR   Specific Gravity, Urine 1.020 1.005 - 1.030   pH 8.0 5.0 - 8.0   Glucose, UA NEGATIVE NEGATIVE mg/dL   Hgb urine dipstick NEGATIVE NEGATIVE   Bilirubin Urine NEGATIVE NEGATIVE   Ketones, ur NEGATIVE NEGATIVE mg/dL   Protein, ur 884 (A) NEGATIVE mg/dL   Nitrite NEGATIVE NEGATIVE   Leukocytes,Ua LARGE (A) NEGATIVE   RBC / HPF 0-5 0 - 5 RBC/hpf   WBC, UA >50 0 - 5 WBC/hpf   Bacteria, UA FEW (A) NONE SEEN   Squamous Epithelial / HPF 11-20 0 - 5 /HPF   Mucus PRESENT    Triple Phosphate Crystal PRESENT     Comment: Performed at [facility], [address],  [place], [place] [address]   Pregnancy, urine     Status: None   Collection Time: 10/05/22  6:45 PM  Result Value Ref Range   Preg Test, Ur NEGATIVE NEGATIVE    Comment:        THE SENSITIVITY OF THIS METHODOLOGY IS >25 mIU/mL. Performed at [facility], [address] [place], [place] [address]    CT ABDOMEN PELVIS W CONTRAST  Result Date: 10/05/2022 CLINICAL DATA:  Left lower quadrant abdominal pain. EXAM: CT ABDOMEN AND PELVIS WITH CONTRAST TECHNIQUE: Multidetector CT imaging of the abdomen and pelvis was performed using the standard protocol following bolus administration of intravenous contrast. RADIATION DOSE REDUCTION: This exam was performed according to the departmental dose-optimization program which includes automated exposure control, adjustment of the mA and/or kV according to patient size and/or use of iterative reconstruction technique. CONTRAST:  75mL OMNIPAQUE IOHEXOL 350 MG/ML SOLN COMPARISON:  Noncontrast CT 05/19/2017 FINDINGS: Lower chest: Mild cardiomegaly. No basilar airspace disease or pleural effusion. Hepatobiliary: Motion artifact through the liver and gallbladder. Allowing for this, no acute findings. No calcified gallstone. No biliary dilatation. Pancreas: Motion artifact through the pancreas. Allowing for this, no acute findings. No ductal dilatation or inflammation. Spleen: Motion artifact through the spleen. Allowing for this, no focal abnormality. No splenomegaly. Adrenals/Urinary Tract: Motion artifact through the adrenal glands and kidneys. Allowing for this, no adrenal nodule. 18 mm stone in the left proximal ureter just beyond the ureteropelvic junction with mild left hydronephrosis. There is heterogeneous enhancement of the left kidney, no discrete fluid collection. The ureter distal to the proximal ureteral stone is decompressed. No definite additional intrarenal calculi within either kidney. There is no right hydronephrosis. Partially distended urinary bladder is normal for degree of  distension. Stomach/Bowel: Motion artifact limits detailed bowel assessment. The stomach is nondistended. No bowel obstruction or inflammation. Normal appendix visualized. Small volume of stool in the colon. Occasional sigmoid diverticula without diverticulitis. Vascular/Lymphatic: Normal caliber abdominal aorta. The portal vein is patent. Adenopathy assessment is limited given motion. Reproductive: Intrauterine device in the uterus, grossly normally position by CT. No adnexal mass. Other: No free air, free fluid, or intra-abdominal fluid collection. Small fat containing umbilical hernia. Musculoskeletal: There are no acute or suspicious osseous abnormalities. Mild rightward curvature of the lumbar spine pain IMPRESSION: 1. Obstructing 18 mm stone in the left proximal ureter just beyond the ureteropelvic junction with mild left hydronephrosis. 2. Minimal sigmoid colonic diverticulosis without diverticulitis. 3. Small fat containing umbilical hernia. 4. Motion limited exam. Electronically Signed   By: Narda Rutherford M.D.   On: 10/05/2022 19:52    Pending Labs Unresulted Labs (From admission, onward)     Start     Ordered   10/06/22 0500  CBC  Tomorrow morning,   R        10/05/22 2115   10/06/22 0500  Comprehensive metabolic panel  Tomorrow morning,   R        10/05/22 2115   10/05/22 2114  HIV Antibody (routine testing w rflx)  (HIV Antibody (Routine testing w reflex) panel)  Once,   R        10/05/22 2115   10/05/22 2006  Urine Culture  Once,   URGENT       Question:  Indication  Answer:  Flank Pain   10/05/22 2005            Vitals/Pain Today's Vitals   10/05/22 1851 10/05/22 1852 10/05/22 2018 10/05/22 2056  BP:  124/89    Pulse:  80    Resp:  16    Temp:   (!) 102.3 F (39.1 C)   TempSrc:   Oral   SpO2:  100%    Weight:      Height:      PainSc: 7    8     Isolation Precautions No active isolations  Medications Medications  cefTRIAXone (ROCEPHIN) 1 g in sodium chloride  0.9 % 100 mL IVPB (has no administration in time range)  ondansetron (ZOFRAN) injection 4 mg (has no administration in time range)  acetaminophen (TYLENOL) tablet 650 mg (has no administration in time range)  potassium chloride SA (KLOR-CON M) CR tablet 40 mEq (has no administration in time range)  lactated ringers infusion (has no administration in time range)  morphine (PF) 2 MG/ML injection 2 mg (has no administration in time range)  iohexol (OMNIPAQUE) 350 MG/ML injection 75 mL (75 mLs Intravenous Contrast Given 10/05/22 1941)  cefTRIAXone (ROCEPHIN) 1 g in sodium chloride 0.9 % 100 mL IVPB (1 g Intravenous New Bag/Given 10/05/22 2056)  acetaminophen (TYLENOL) tablet 1,000 mg (1,000 mg Oral Given 10/05/22 2055)  morphine (PF) 4 MG/ML injection 4 mg (4 mg Intravenous Given 10/05/22 2054)  ondansetron (ZOFRAN) injection 4 mg (4 mg Intravenous Given 10/05/22 2051)    Mobility walks      R Recommendations: See Admitting Provider Note  Report given to:   Additional Notes:  Pt is A&Ox4, continent x2, ambulatory.

## 2022-10-05 NOTE — Assessment & Plan Note (Signed)
-  no recent HbA1C -no current hyperglycemia

## 2022-10-06 ENCOUNTER — Inpatient Hospital Stay (HOSPITAL_COMMUNITY): Payer: Medicaid Other

## 2022-10-06 DIAGNOSIS — E119 Type 2 diabetes mellitus without complications: Secondary | ICD-10-CM | POA: Diagnosis not present

## 2022-10-06 DIAGNOSIS — N390 Urinary tract infection, site not specified: Secondary | ICD-10-CM | POA: Diagnosis present

## 2022-10-06 DIAGNOSIS — A419 Sepsis, unspecified organism: Secondary | ICD-10-CM | POA: Diagnosis not present

## 2022-10-06 DIAGNOSIS — R509 Fever, unspecified: Secondary | ICD-10-CM | POA: Diagnosis not present

## 2022-10-06 DIAGNOSIS — N201 Calculus of ureter: Secondary | ICD-10-CM | POA: Diagnosis not present

## 2022-10-06 DIAGNOSIS — E876 Hypokalemia: Secondary | ICD-10-CM | POA: Diagnosis not present

## 2022-10-06 HISTORY — PX: IR NEPHROSTOMY PLACEMENT LEFT: IMG6063

## 2022-10-06 LAB — MAGNESIUM: Magnesium: 2.2 mg/dL (ref 1.7–2.4)

## 2022-10-06 LAB — HIV ANTIBODY (ROUTINE TESTING W REFLEX): HIV Screen 4th Generation wRfx: NONREACTIVE

## 2022-10-06 MED ORDER — POLYETHYLENE GLYCOL 3350 17 G PO PACK
17.0000 g | PACK | Freq: Every day | ORAL | Status: DC | PRN
  Administered 2022-10-06 – 2022-10-07 (×2): 17 g via ORAL
  Filled 2022-10-06 (×2): qty 1

## 2022-10-06 MED ORDER — LIDOCAINE HCL (PF) 1 % IJ SOLN
10.0000 mL | Freq: Once | INTRAMUSCULAR | Status: AC
  Administered 2022-10-06: 10 mL
  Filled 2022-10-06: qty 10

## 2022-10-06 MED ORDER — SENNOSIDES-DOCUSATE SODIUM 8.6-50 MG PO TABS
1.0000 | ORAL_TABLET | Freq: Two times a day (BID) | ORAL | Status: DC
  Administered 2022-10-06 – 2022-10-07 (×2): 1 via ORAL
  Filled 2022-10-06 (×2): qty 1

## 2022-10-06 MED ORDER — POTASSIUM CHLORIDE CRYS ER 20 MEQ PO TBCR
40.0000 meq | EXTENDED_RELEASE_TABLET | Freq: Once | ORAL | Status: AC
  Administered 2022-10-06: 40 meq via ORAL
  Filled 2022-10-06: qty 2

## 2022-10-06 MED ORDER — KCL-LACTATED RINGERS 20 MEQ/L IV SOLN
INTRAVENOUS | Status: DC
  Filled 2022-10-06: qty 1000

## 2022-10-06 MED ORDER — SODIUM CHLORIDE 0.9 % IV SOLN
INTRAVENOUS | Status: AC
  Filled 2022-10-06: qty 20

## 2022-10-06 MED ORDER — IOHEXOL 300 MG/ML  SOLN
50.0000 mL | Freq: Once | INTRAMUSCULAR | Status: AC | PRN
  Administered 2022-10-06: 8 mL

## 2022-10-06 MED ORDER — MIDAZOLAM HCL 2 MG/2ML IJ SOLN
INTRAMUSCULAR | Status: AC | PRN
  Administered 2022-10-06: 1 mg via INTRAVENOUS

## 2022-10-06 MED ORDER — LIDOCAINE HCL 1 % IJ SOLN
INTRAMUSCULAR | Status: AC
  Filled 2022-10-06: qty 20

## 2022-10-06 MED ORDER — MIDAZOLAM HCL 2 MG/2ML IJ SOLN
INTRAMUSCULAR | Status: AC
  Filled 2022-10-06: qty 2

## 2022-10-06 MED ORDER — ORAL CARE MOUTH RINSE: 15.0000 mL | OROMUCOSAL | Status: DC | PRN

## 2022-10-06 MED ORDER — FENTANYL CITRATE (PF) 100 MCG/2ML IJ SOLN
INTRAMUSCULAR | Status: AC
  Filled 2022-10-06: qty 2

## 2022-10-06 MED ORDER — FENTANYL CITRATE (PF) 100 MCG/2ML IJ SOLN
INTRAMUSCULAR | Status: AC | PRN
  Administered 2022-10-06: 25 ug via INTRAVENOUS

## 2022-10-06 MED ORDER — SODIUM CHLORIDE 0.9 % IV SOLN
INTRAVENOUS | Status: AC | PRN
  Administered 2022-10-06: 1 g via INTRAVENOUS

## 2022-10-06 NOTE — Plan of Care (Signed)
  Problem: Education: Goal: Knowledge of General Education information will improve Description: Including pain rating scale, medication(s)/side effects and non-pharmacologic comfort measures Outcome: Progressing   Problem: Clinical Measurements: Goal: Will remain free from infection Outcome: Progressing   Problem: Activity: Goal: Risk for activity intolerance will decrease Outcome: Progressing   Problem: Elimination: Goal: Will not experience complications related to bowel motility Outcome: Progressing   Problem: Elimination: Goal: Will not experience complications related to urinary retention Outcome: Progressing   Problem: Pain Managment: Goal: General experience of comfort will improve Outcome: Progressing

## 2022-10-06 NOTE — Procedures (Addendum)
Interventional Radiology Procedure Note  Procedure:   Image guided left PCN.  Marland Kitchen  Complications: None Recommendations:  - To gravity drain - Do not submerge - Routine care  - OK to advance diet per primary order  Signed,  Yvone Neu. Loreta Ave, DO

## 2022-10-06 NOTE — Progress Notes (Addendum)
Progress Note    [name]  HYQ:[id number] DOB: [birth year]  DOA: 10/05/2022 PCP: Norm Salt, PA      Brief Narrative:    Medical records reviewed and are as summarized below:  [name] is a 37 y.o. female with medical history significant for s/p repair of cor triatriatum, mitral valve prolapse, hypertension, hyperlipidemia, HSV infection, herpes genitalia, gestational DM, type II DM, chronic diastolic CHF, anxiety.  She presented to the hospital because of increased frequency of micturition, fever, left flank pain and abdominal pain. She was febrile in the ED with temperature 102.3 F.        Assessment/Plan:   Principal Problem:   Sepsis (HCC) Active Problems:   Hydronephrosis with obstructing calculus   Elevated LFTs   Hypokalemia   Type 2 diabetes mellitus (HCC)   Acute UTI   Body mass index is 38.66 kg/m.  (Obesity)   Sepsis secondary to acute complicated UTI: Continue empiric IV ceftriaxone.  Follow-up urine culture.   Left proximal ureter obstructive stone 18 mm, left hydronephrosis: S/p placement of left nephrostomy tube.   Hypokalemia: Replete potassium and monitor levels   Type II DM: Glucose levels are optimal    Diet Order             Diet Heart Room service appropriate? Yes; Fluid consistency: Thin  Diet effective now                            Consultants: Interventional radiologist  Procedures: S/p placement of left nephrostomy tube on 10/06/2022    Medications:    pneumococcal 23 valent vaccine  0.5 mL Intramuscular Tomorrow-1000   potassium chloride  40 mEq Oral Once   Continuous Infusions:  cefTRIAXone (ROCEPHIN)  IV 1 g (10/06/22 0808)     Anti-infectives (From admission, onward)    Start     Dose/Rate Route Frequency Ordered Stop   10/06/22 1000  cefTRIAXone (ROCEPHIN) 1 g in sodium chloride 0.9 % 100 mL IVPB        1 g 200 mL/hr over 30 Minutes Intravenous Every 24 hours  10/05/22 2117     10/06/22 0912  cefTRIAXone (ROCEPHIN) 1 g in sodium chloride 0.9 % 100 mL IVPB        over 30 Minutes  Continuous PRN 10/06/22 0927 10/06/22 0912   10/05/22 2045  cefTRIAXone (ROCEPHIN) 1 g in sodium chloride 0.9 % 100 mL IVPB        1 g 200 mL/hr over 30 Minutes Intravenous  Once 10/05/22 2040 10/05/22 2156              Family Communication/Anticipated D/C date and plan/Code Status   DVT prophylaxis: SCDs Start: 10/05/22 2115     Code Status: Full Code  Family Communication: None Disposition Plan: Plan to discharge home in 1 to 2 days   Status is: Inpatient Remains inpatient appropriate because: Complicated UTI       Subjective:   Interval events noted.  C/o abdominal pain 7/10. Erin, RN, was at the bedside  Objective:    Vitals:   10/06/22 0920 10/06/22 0925 10/06/22 0930 10/06/22 1014  BP: 139/83 129/81 131/80 114/78  Pulse: 80 85 83 78  Resp: 19 (!) 22 15 18   Temp:    98.2 F (36.8 C)  TempSrc:    Oral  SpO2: 96% 98% 98% 97%  Weight:      Height:  No data found.   Intake/Output Summary (Last 24 hours) at 10/06/2022 1337 Last data filed at 10/06/2022 0426 Gross per 24 hour  Intake 37.89 ml  Output --  Net 37.89 ml   Filed Weights   10/05/22 1625  Weight: 83.9 kg    Exam:  GEN: NAD SKIN: No rash EYES: EOMI ENT: MMM CV: RRR PULM: CTA B ABD: soft, obese, NT, +BS CNS: AAO x 3, non focal EXT: No edema or tenderness GU: Left CVA tenderness, left nephrostomy tube with bloody urine      Data Reviewed:   I have personally reviewed following labs and imaging studies:  Labs: Labs show the following:   Basic Metabolic Panel: Recent Labs  Lab 10/05/22 1656 10/06/22 0347  NA 137 135  K 3.3* 3.4*  CL 100 99  CO2 27 25  GLUCOSE 100* 83  BUN 10 8  CREATININE 0.75 0.74  CALCIUM 8.6* 8.3*  MG  --  2.2   GFR Estimated Creatinine Clearance: 89.2 mL/min (by C-G formula based on SCr of 0.74 mg/dL). Liver  Function Tests: Recent Labs  Lab 10/05/22 1656 10/06/22 0347  AST 44* 30  ALT 54* 44  ALKPHOS 136* 114  BILITOT 0.9 0.9  PROT 7.2 6.5  ALBUMIN 3.3* 2.8*   Recent Labs  Lab 10/05/22 1656  LIPASE 51   No results for input(s): "AMMONIA" in the last 168 hours. Coagulation profile No results for input(s): "INR", "PROTIME" in the last 168 hours.  CBC: Recent Labs  Lab 10/05/22 1656 10/06/22 0347  WBC 11.1* 11.3*  NEUTROABS 7.3  --   HGB 14.1 13.4  HCT 41.6 38.6  MCV 92.7 89.8  PLT 294 280   Cardiac Enzymes: No results for input(s): "CKTOTAL", "CKMB", "CKMBINDEX", "TROPONINI" in the last 168 hours. BNP (last 3 results) No results for input(s): "PROBNP" in the last 8760 hours. CBG: No results for input(s): "GLUCAP" in the last 168 hours. D-Dimer: No results for input(s): "DDIMER" in the last 72 hours. Hgb A1c: No results for input(s): "HGBA1C" in the last 72 hours. Lipid Profile: No results for input(s): "CHOL", "HDL", "LDLCALC", "TRIG", "CHOLHDL", "LDLDIRECT" in the last 72 hours. Thyroid function studies: No results for input(s): "TSH", "T4TOTAL", "T3FREE", "THYROIDAB" in the last 72 hours.  Invalid input(s): "FREET3" Anemia work up: No results for input(s): "VITAMINB12", "FOLATE", "FERRITIN", "TIBC", "IRON", "RETICCTPCT" in the last 72 hours. Sepsis Labs: Recent Labs  Lab 10/05/22 1656 10/06/22 0347  WBC 11.1* 11.3*    Microbiology No results found for this or any previous visit (from the past 240 hour(s)).  Procedures and diagnostic studies:  IR NEPHROSTOMY PLACEMENT LEFT  Result Date: 10/06/2022 INDICATION: 37 year old female with obstructing left ureteral stone referred for left PCN EXAM: IR NEPHROSTOMY PLACEMENT LEFT COMPARISON:  CT 10/05/2022 MEDICATIONS: 1 g Rocephin; The antibiotic was administered in an appropriate time frame prior to skin puncture. ANESTHESIA/SEDATION: Fentanyl 50 mcg IV; Versed 1.0 mg IV Moderate Sedation Time:  11 minutes The  patient was continuously monitored during the procedure by the interventional radiology nurse under my direct supervision. CONTRAST:  8mL OMNIPAQUE IOHEXOL 300 MG/ML SOLN - administered into the collecting system(s) FLUOROSCOPY TIME:  Fluoroscopy Time: (4 mGy). COMPLICATIONS: None PROCEDURE: Informed written consent was obtained from the patient after a thorough discussion of the procedural risks, benefits and alternatives. All questions were addressed. Maximal Sterile Barrier Technique was utilized including caps, mask, sterile gowns, sterile gloves, sterile drape, hand hygiene and skin antiseptic. A timeout was performed prior  to the initiation of the procedure. Patient positioned prone position on the fluoroscopy table. Ultrasound survey of the left flank was performed with images stored and sent to PACs. The patient was then prepped and draped in the usual sterile fashion. 1% lidocaine was used to anesthetize the skin and subcutaneous tissues for local anesthesia. A Chiba needle was then used to access a posterior inferior calyx with ultrasound guidance. With spontaneous urine returned through the needle, passage of an 018 micro wire into the collecting system was performed under fluoroscopy. A small incision was made with an 11 blade scalpel, and the needle was removed from the wire. An Accustick system was then advanced over the wire into the collecting system under fluoroscopy. The metal stiffener and inner dilator were removed, and then a sample of fluid was aspirated through the 4 French outer sheath. Bentson wire was passed into the collecting system and the sheath removed. Ten French dilation of the soft tissues was performed. Using modified Seldinger technique, a 10 French pigtail catheter drain was placed over the Bentson wire. Wire and inner stiffener removed, and the pigtail was formed in the collecting system. Small amount of contrast confirmed position of the catheter. Patient tolerated the  procedure well and remained hemodynamically stable throughout. No complications were encountered and no significant blood loss encountered IMPRESSION: Status post image guided left PCN. Signed, Yvone Neu. Miachel Roux, RPVI [facility] Radiology Electronically Signed   By: Gilmer Mor D.O.   On: 10/06/2022 10:12   CT ABDOMEN PELVIS W CONTRAST  Result Date: 10/05/2022 CLINICAL DATA:  Left lower quadrant abdominal pain. EXAM: CT ABDOMEN AND PELVIS WITH CONTRAST TECHNIQUE: Multidetector CT imaging of the abdomen and pelvis was performed using the standard protocol following bolus administration of intravenous contrast. RADIATION DOSE REDUCTION: This exam was performed according to the departmental dose-optimization program which includes automated exposure control, adjustment of the mA and/or kV according to patient size and/or use of iterative reconstruction technique. CONTRAST:  75mL OMNIPAQUE IOHEXOL 350 MG/ML SOLN COMPARISON:  Noncontrast CT 05/19/2017 FINDINGS: Lower chest: Mild cardiomegaly. No basilar airspace disease or pleural effusion. Hepatobiliary: Motion artifact through the liver and gallbladder. Allowing for this, no acute findings. No calcified gallstone. No biliary dilatation. Pancreas: Motion artifact through the pancreas. Allowing for this, no acute findings. No ductal dilatation or inflammation. Spleen: Motion artifact through the spleen. Allowing for this, no focal abnormality. No splenomegaly. Adrenals/Urinary Tract: Motion artifact through the adrenal glands and kidneys. Allowing for this, no adrenal nodule. 18 mm stone in the left proximal ureter just beyond the ureteropelvic junction with mild left hydronephrosis. There is heterogeneous enhancement of the left kidney, no discrete fluid collection. The ureter distal to the proximal ureteral stone is decompressed. No definite additional intrarenal calculi within either kidney. There is no  right hydronephrosis. Partially distended urinary bladder is normal for degree of distension. Stomach/Bowel: Motion artifact limits detailed bowel assessment. The stomach is nondistended. No bowel obstruction or inflammation. Normal appendix visualized. Small volume of stool in the colon. Occasional sigmoid diverticula without diverticulitis. Vascular/Lymphatic: Normal caliber abdominal aorta. The portal vein is patent. Adenopathy assessment is limited given motion. Reproductive: Intrauterine device in the uterus, grossly normally position by CT. No adnexal mass. Other: No free air, free fluid, or intra-abdominal fluid collection. Small fat containing umbilical hernia. Musculoskeletal: There are no acute or suspicious osseous abnormalities. Mild rightward curvature of the lumbar spine pain IMPRESSION: 1. Obstructing 18 mm stone in the left  proximal ureter just beyond the ureteropelvic junction with mild left hydronephrosis. 2. Minimal sigmoid colonic diverticulosis without diverticulitis. 3. Small fat containing umbilical hernia. 4. Motion limited exam. Electronically Signed   By: Narda Rutherford M.D.   On: 10/05/2022 19:52               LOS: 1 day   Jmya Uliano  [facility]   Pager on [email or web address]. If 7PM-7AM, please contact night-coverage at [email or web address]     10/06/2022, 1:37 PM

## 2022-10-06 NOTE — Consult Note (Signed)
Urology Consult  Referring physician: Dr. Cyndia Bent Reason for referral: left ureteral stone, sepsis  Chief Complaint: left flank pain and fever  History of Present Illness: [name] is a 36yo with a history of DMII and nephrolithiasis who presented to the ER yesterday with a 4 day history of abdominal pain and fevers for 101. She has a history of passing a small calculus during pregnancy. She initially thought the issue was constipation and took a laxative. After the laxative she developed fevers and worsening abdominal pain which prompted her to go to the ER. CT obtained in the ER showed a 1.8cm left proximal ureteral calculus. [facility] count 11, creatinine 0.75. UA concerning for infection. Temp 102.3  Past Medical History:  Diagnosis Date   Abnormal Pap smear    Anxiety    on meds   CHF (congestive heart failure) (HCC)    DM2 (diabetes mellitus, type 2) (HCC)    Gestational diabetes    Healthcare-associated pneumonia 10/31/2012   Herpes genitalia    HSV (herpes simplex virus) infection    last outbreak in may   Hyperlipidemia    Hypertension    Infection    UTI   Mitral valve prolapse    Obesity    Status post repair of cor triatriatum    Echocardiogram 12/05: EF 55-65%, no mitral valve prolapse, mild RVE and no mention of any residual ASD.     Past Surgical History:  Procedure Laterality Date   CARDIAC SURGERY     congenital heart defect repair at 75 mos   CESAREAN SECTION N/A 10/06/2012   Procedure: Primary CESAREAN SECTION  of baby girl at 0347 APGAR 8/8;  Surgeon: Philip Aspen, DO;  Location: [facility];  Service: Obstetrics;  Laterality: N/A;   EXPLORATION POST OPERATIVE OPEN HEART     IR NEPHROSTOMY PLACEMENT LEFT  10/06/2022   LAPAROTOMY N/A 10/07/2012   Procedure: EXPLORATORY LAPAROTOMY;  Surgeon: Freddrick March. Tenny Craw, MD;  Location: [facility];  Service: Gynecology;  Laterality: N/A;    Medications: I have reviewed the patient's current medications. Allergies:  Allergies  Allergen  Reactions   Mucinex [Guaifenesin Er] Shortness Of Breath    Family History  Problem Relation Age of Onset   Diabetes Mother    Asthma Mother    Diabetes Father    Asthma Father    Diabetes Maternal Grandmother    Diabetes Paternal Grandfather    Social History:  reports that she has quit smoking. Her smoking use included cigarettes. She has never used smokeless tobacco. She reports current alcohol use. She reports that she does not use drugs.  Review of Systems  Constitutional:  Positive for chills, fatigue and fever.  Gastrointestinal:  Positive for abdominal pain.  Genitourinary:  Positive for difficulty urinating, dysuria and flank pain.  All other systems reviewed and are negative.   Physical Exam:  Vital signs in last 24 hours: Temp:  [97.6 F (36.4 C)-102.3 F (39.1 C)] 98.3 F (36.8 C) (07/27 1541) Pulse Rate:  [69-108] 82 (07/27 1541) Resp:  [15-22] 18 (07/27 1541) BP: (98-147)/(57-89) 106/69 (07/27 1541) SpO2:  [96 %-100 %] 97 % (07/27 1541) Physical Exam Vitals reviewed.  Constitutional:      Appearance: She is well-developed.  HENT:     Head: Normocephalic and atraumatic.  Eyes:     Extraocular Movements: Extraocular movements intact.     Pupils: Pupils are equal, round, and reactive to light.  Cardiovascular:     Rate and Rhythm: Normal rate  and regular rhythm.  Pulmonary:     Effort: Pulmonary effort is normal. No respiratory distress.  Abdominal:     General: Abdomen is flat. There is no distension.  Skin:    General: Skin is warm and dry.  Neurological:     General: No focal deficit present.     Mental Status: She is alert and oriented to person, place, and time.  Psychiatric:        Mood and Affect: Mood normal.        Behavior: Behavior normal.     Laboratory Data:  Results for orders placed or performed during the hospital encounter of 10/05/22 (from the past 72 hour(s))  CBC with Differential     Status: Abnormal   Collection Time:  10/05/22  4:56 PM  Result Value Ref Range   WBC 11.1 (H) 4.0 - 10.5 K/uL   RBC 4.49 3.87 - 5.11 MIL/uL   Hemoglobin 14.1 12.0 - 15.0 g/dL   HCT 11.9 14.7 - 82.9 %   MCV 92.7 80.0 - 100.0 fL   MCH 31.4 26.0 - 34.0 pg   MCHC 33.9 30.0 - 36.0 g/dL   RDW 56.2 13.0 - 86.5 %   Platelets 294 150 - 400 K/uL   nRBC 0.0 0.0 - 0.2 %   Neutrophils Relative % 65 %   Neutro Abs 7.3 1.7 - 7.7 K/uL   Lymphocytes Relative 15 %   Lymphs Abs 1.7 0.7 - 4.0 K/uL   Monocytes Relative 17 %   Monocytes Absolute 1.9 (H) 0.1 - 1.0 K/uL   Eosinophils Relative 1 %   Eosinophils Absolute 0.2 0.0 - 0.5 K/uL   Basophils Relative 1 %   Basophils Absolute 0.1 0.0 - 0.1 K/uL   Immature Granulocytes 1 %   Abs Immature Granulocytes 0.05 0.00 - 0.07 K/uL    Comment: Performed at [facility], [address] [place], [place] [address]  Comprehensive metabolic panel     Status: Abnormal   Collection Time: 10/05/22  4:56 PM  Result Value Ref Range   Sodium 137 135 - 145 mmol/L   Potassium 3.3 (L) 3.5 - 5.1 mmol/L   Chloride 100 98 - 111 mmol/L   CO2 27 22 - 32 mmol/L   Glucose, Bld 100 (H) 70 - 99 mg/dL    Comment: Glucose reference range applies only to samples taken after fasting for at least 8 hours.   BUN 10 6 - 20 mg/dL   Creatinine, Ser 6.29 0.44 - 1.00 mg/dL   Calcium 8.6 (L) 8.9 - 10.3 mg/dL   Total Protein 7.2 6.5 - 8.1 g/dL   Albumin 3.3 (L) 3.5 - 5.0 g/dL   AST 44 (H) 15 - 41 U/L   ALT 54 (H) 0 - 44 U/L   Alkaline Phosphatase 136 (H) 38 - 126 U/L   Total Bilirubin 0.9 0.3 - 1.2 mg/dL   GFR, Estimated >52 >84 mL/min    Comment: (NOTE) Calculated using the CKD-EPI Creatinine Equation (2021)    Anion gap 10 5 - 15    Comment: Performed at [facility], [address], [place], [place] [address]  Lipase, blood     Status: None   Collection Time: 10/05/22  4:56 PM  Result Value Ref Range   Lipase 51 11 - 51 U/L    Comment: Performed at [facility], [address]  [place], [place] [address]  Urinalysis, Routine w reflex microscopic -Urine, Clean Catch  Status: Abnormal   Collection Time: 10/05/22  6:45 PM  Result Value Ref Range   Color, Urine AMBER (A) YELLOW    Comment: BIOCHEMICALS MAY BE AFFECTED BY COLOR   APPearance CLOUDY (A) CLEAR   Specific Gravity, Urine 1.020 1.005 - 1.030   pH 8.0 5.0 - 8.0   Glucose, UA NEGATIVE NEGATIVE mg/dL   Hgb urine dipstick NEGATIVE NEGATIVE   Bilirubin Urine NEGATIVE NEGATIVE   Ketones, ur NEGATIVE NEGATIVE mg/dL   Protein, ur 161 (A) NEGATIVE mg/dL   Nitrite NEGATIVE NEGATIVE   Leukocytes,Ua LARGE (A) NEGATIVE   RBC / HPF 0-5 0 - 5 RBC/hpf   WBC, UA >50 0 - 5 WBC/hpf   Bacteria, UA FEW (A) NONE SEEN   Squamous Epithelial / HPF 11-20 0 - 5 /HPF   Mucus PRESENT    Triple Phosphate Crystal PRESENT     Comment: Performed at [facility], [address] [place], [place] [address]  Pregnancy, urine     Status: None   Collection Time: 10/05/22  6:45 PM  Result Value Ref Range   Preg Test, Ur NEGATIVE NEGATIVE    Comment:        THE SENSITIVITY OF THIS METHODOLOGY IS >25 mIU/mL. Performed at [facility], [address] [place], [place] [address]   CBC     Status: Abnormal   Collection Time: 10/06/22  3:47 AM  Result Value Ref Range   WBC 11.3 (H) 4.0 - 10.5 K/uL   RBC 4.30 3.87 - 5.11 MIL/uL   Hemoglobin 13.4 12.0 - 15.0 g/dL   HCT 11.9 14.7 - 82.9 %   MCV 89.8 80.0 - 100.0 fL   MCH 31.2 26.0 - 34.0 pg   MCHC 34.7 30.0 - 36.0 g/dL   RDW 56.2 13.0 - 86.5 %   Platelets 280 150 - 400 K/uL   nRBC 0.0 0.0 - 0.2 %    Comment: Performed at [facility], [address], [place], [place] [address]  Comprehensive metabolic panel     Status: Abnormal   Collection Time: 10/06/22  3:47 AM  Result Value Ref Range   Sodium 135 135 - 145 mmol/L   Potassium 3.4 (L) 3.5 - 5.1 mmol/L   Chloride 99 98 - 111 mmol/L   CO2 25 22 - 32 mmol/L   Glucose, Bld 83 70 - 99 mg/dL    Comment: Glucose  reference range applies only to samples taken after fasting for at least 8 hours.   BUN 8 6 - 20 mg/dL   Creatinine, Ser 6.29 0.44 - 1.00 mg/dL   Calcium 8.3 (L) 8.9 - 10.3 mg/dL   Total Protein 6.5 6.5 - 8.1 g/dL   Albumin 2.8 (L) 3.5 - 5.0 g/dL   AST 30 15 - 41 U/L   ALT 44 0 - 44 U/L   Alkaline Phosphatase 114 38 - 126 U/L   Total Bilirubin 0.9 0.3 - 1.2 mg/dL   GFR, Estimated >52 >84 mL/min    Comment: (NOTE) Calculated using the CKD-EPI Creatinine Equation (2021)    Anion gap 11 5 - 15    Comment: Performed at [facility], [address], [place], [place] [address]  HIV Antibody (routine testing w rflx)     Status: None   Collection Time: 10/06/22  3:47 AM  Result Value Ref Range   HIV Screen 4th Generation wRfx Non Reactive Non Reactive    Comment: Performed at [facility], [address], [place], [place] [address]  Magnesium     Status: None   Collection Time: 10/06/22  3:47 AM  Result Value Ref Range   Magnesium 2.2 1.7 - 2.4 mg/dL    Comment: Performed at [facility], [address] [place], [place] [address]   No results found for this or any previous visit (from the past 240 hour(s)). Creatinine: Recent Labs    10/05/22 1656 10/06/22 0347  CREATININE 0.75 0.74   Baseline Creatinine: 0.7  Impression/Assessment:  36yo with left ureteral calculus, sepsis from a urinary source  Plan:  -We discussed the management of kidney stones. These options include observation, ureteroscopy, shockwave lithotripsy (ESWL) and percutaneous nephrolithotomy (PCNL). We discussed which options are relevant to the patient's stone(s). We discussed the natural history of kidney stones as well as the complications of untreated stones and the impact on quality of life without treatment as well as with each of the above listed treatments. We also discussed the efficacy of each treatment in its ability to clear the stone burden. With any of these management options I  discussed the signs and symptoms of infection and the need for emergent treatment should these be experienced. For each option we discussed the ability of each procedure to clear the patient of their stone burden.   For observation I described the risks which include but are not limited to silent renal damage, life-threatening infection, need for emergent surgery, failure to pass stone and pain.   For ureteroscopy I described the risks which include bleeding, infection, damage to contiguous structures, positioning injury, ureteral stricture, ureteral avulsion, ureteral injury, need for prolonged ureteral stent, inability to perform ureteroscopy, need for an interval procedure, inability to clear stone burden, stent discomfort/pain, heart attack, stroke, pulmonary embolus and the inherent risks with general anesthesia.   For shockwave lithotripsy I described the risks which include arrhythmia, kidney contusion, kidney hemorrhage, need for transfusion, pain, inability to adequately break up stone, inability to pass stone fragments, Steinstrasse, infection associated with obstructing stones, need for alternate surgical procedure, need for repeat shockwave lithotripsy, MI, CVA, PE and the inherent risks with anesthesia/conscious sedation.   For PCNL I described the risks including positioning injury, pneumothorax, hydrothorax, need for chest tube, inability to clear stone burden, renal laceration, arterial venous fistula or malformation, need for embolization of kidney, loss of kidney or renal function, need for repeat procedure, need for prolonged nephrostomy tube, ureteral avulsion, MI, CVA, PE and the inherent risks of general anesthesia.   - The patient would like to proceed with Left nephrostomy tube placement. She should remain on culture specific antibiotics for 2 weeks and then she will be scheduled for left PCNL as an outpatient  Wilkie Aye 10/06/2022, 4:25 PM

## 2022-10-06 NOTE — Plan of Care (Signed)

## 2022-10-06 NOTE — Consult Note (Signed)
Chief Complaint: Patient was seen in consultation today for  Chief Complaint  Patient presents with   Abdominal Pain     Referring Physician(s): Dr. Ronne Binning  Supervising Physician: Gilmer Mor  Patient Status: [name] - In-pt  History of Present Illness: [name] is a 37 y.o. female admitted with LLQ and flank pain. Workup finds left ureteral stone with obstruction and hydronephrosis. Urology consulted and requests PCN placement by IR.  PMHx, meds, labs, imaging, allergies reviewed. Feels well, no recent fevers, chills, illness. Has been NPO today as directed.    Past Medical History:  Diagnosis Date   Abnormal Pap smear    Anxiety    on meds   CHF (congestive heart failure) (HCC)    DM2 (diabetes mellitus, type 2) (HCC)    Gestational diabetes    Healthcare-associated pneumonia 10/31/2012   Herpes genitalia    HSV (herpes simplex virus) infection    last outbreak in may   Hyperlipidemia    Hypertension    Infection    UTI   Mitral valve prolapse    Obesity    Status post repair of cor triatriatum    Echocardiogram 12/05: EF 55-65%, no mitral valve prolapse, mild RVE and no mention of any residual ASD.      Past Surgical History:  Procedure Laterality Date   CARDIAC SURGERY     congenital heart defect repair at 49 mos   CESAREAN SECTION N/A 10/06/2012   Procedure: Primary CESAREAN SECTION  of baby girl at 0347 APGAR 8/8;  Surgeon: Philip Aspen, DO;  Location: [facility];  Service: Obstetrics;  Laterality: N/A;   EXPLORATION POST OPERATIVE OPEN HEART     LAPAROTOMY N/A 10/07/2012   Procedure: EXPLORATORY LAPAROTOMY;  Surgeon: Freddrick March. Tenny Craw, MD;  Location: [facility];  Service: Gynecology;  Laterality: N/A;    Allergies: Mucinex [guaifenesin er]  Medications:  Current Facility-Administered Medications:    acetaminophen (TYLENOL) tablet 650 mg, 650 mg, Oral, Q6H PRN, Tu, Ching T, DO   cefTRIAXone (ROCEPHIN) 1 g in sodium chloride 0.9 % 100 mL IVPB, 1  g, Intravenous, Q24H, Tu, Ching T, DO, Last Rate: 200 mL/hr at 10/06/22 0808, 1 g at 10/06/22 1191   lactated ringers with KCl 20 mEq/L infusion, , Intravenous, Continuous, Lurene Shadow, MD   morphine (PF) 2 MG/ML injection 2 mg, 2 mg, Intravenous, Q3H PRN, Tu, Ching T, DO, 2 mg at 10/06/22 0727   ondansetron (ZOFRAN) injection 4 mg, 4 mg, Intravenous, Q6H PRN, Tu, Ching T, DO   pneumococcal 23 valent vaccine (PNEUMOVAX-23) injection 0.5 mL, 0.5 mL, Intramuscular, Tomorrow-1000, Tu, Ching T, DO    Family History  Problem Relation Age of Onset   Diabetes Mother    Asthma Mother    Diabetes Father    Asthma Father    Diabetes Maternal Grandmother    Diabetes Paternal Grandfather     Social History   Socioeconomic History   Marital status: Married    Spouse name: Not on file   Number of children: Not on file   Years of education: Not on file   Highest education level: Not on file  Occupational History   Not on file  Tobacco Use   Smoking status: Former    Current packs/day: 0.50    Types: Cigarettes   Smokeless tobacco: Never  Substance and Sexual Activity   Alcohol use: Yes    Alcohol/week: 0.0 standard drinks of alcohol    Comment: social   Drug use:  No   Sexual activity: Yes    Birth control/protection: None  Other Topics Concern   Not on file  Social History Narrative   Not on file   Social Determinants of Health   Financial Resource Strain: Not on file  Food Insecurity: No Food Insecurity (10/05/2022)   Hunger Vital Sign    Worried About Running Out of Food in the Last Year: Never true    Ran Out of Food in the Last Year: Never true  Transportation Needs: No Transportation Needs (10/05/2022)   PRAPARE - Administrator, Civil Service (Medical): No    Lack of Transportation (Non-Medical): No  Physical Activity: Not on file  Stress: Not on file  Social Connections: Not on file    Review of Systems: A 12 point ROS discussed and pertinent positives  are indicated in the HPI above.  All other systems are negative.  Review of Systems  Vital Signs: BP 112/64 (BP Location: Right Arm)   Pulse 73   Temp 98.3 F (36.8 C) (Oral)   Resp 18   Ht 4\' 10"  (1.473 m)   Wt 184 lb 15.5 oz (83.9 kg)   SpO2 98%   BMI 38.66 kg/m   Physical Exam Constitutional:      Appearance: She is not ill-appearing.  HENT:     Mouth/Throat:     Mouth: Mucous membranes are moist.     Pharynx: Oropharynx is clear.  Cardiovascular:     Rate and Rhythm: Normal rate and regular rhythm.     Heart sounds: Normal heart sounds.  Pulmonary:     Effort: Pulmonary effort is normal. No respiratory distress.     Breath sounds: Normal breath sounds.  Abdominal:     General: There is no distension.     Palpations: Abdomen is soft.     Tenderness: There is no abdominal tenderness.  Neurological:     General: No focal deficit present.     Mental Status: She is alert and oriented to person, place, and time.  Psychiatric:        Mood and Affect: Mood normal.        Thought Content: Thought content normal.        Judgment: Judgment normal.     Imaging: CT ABDOMEN PELVIS W CONTRAST  Result Date: 10/05/2022 CLINICAL DATA:  Left lower quadrant abdominal pain. EXAM: CT ABDOMEN AND PELVIS WITH CONTRAST TECHNIQUE: Multidetector CT imaging of the abdomen and pelvis was performed using the standard protocol following bolus administration of intravenous contrast. RADIATION DOSE REDUCTION: This exam was performed according to the departmental dose-optimization program which includes automated exposure control, adjustment of the mA and/or kV according to patient size and/or use of iterative reconstruction technique. CONTRAST:  75mL OMNIPAQUE IOHEXOL 350 MG/ML SOLN COMPARISON:  Noncontrast CT 05/19/2017 FINDINGS: Lower chest: Mild cardiomegaly. No basilar airspace disease or pleural effusion. Hepatobiliary: Motion artifact through the liver and gallbladder. Allowing for this, no  acute findings. No calcified gallstone. No biliary dilatation. Pancreas: Motion artifact through the pancreas. Allowing for this, no acute findings. No ductal dilatation or inflammation. Spleen: Motion artifact through the spleen. Allowing for this, no focal abnormality. No splenomegaly. Adrenals/Urinary Tract: Motion artifact through the adrenal glands and kidneys. Allowing for this, no adrenal nodule. 18 mm stone in the left proximal ureter just beyond the ureteropelvic junction with mild left hydronephrosis. There is heterogeneous enhancement of the left kidney, no discrete fluid collection. The ureter distal to the proximal ureteral  stone is decompressed. No definite additional intrarenal calculi within either kidney. There is no right hydronephrosis. Partially distended urinary bladder is normal for degree of distension. Stomach/Bowel: Motion artifact limits detailed bowel assessment. The stomach is nondistended. No bowel obstruction or inflammation. Normal appendix visualized. Small volume of stool in the colon. Occasional sigmoid diverticula without diverticulitis. Vascular/Lymphatic: Normal caliber abdominal aorta. The portal vein is patent. Adenopathy assessment is limited given motion. Reproductive: Intrauterine device in the uterus, grossly normally position by CT. No adnexal mass. Other: No free air, free fluid, or intra-abdominal fluid collection. Small fat containing umbilical hernia. Musculoskeletal: There are no acute or suspicious osseous abnormalities. Mild rightward curvature of the lumbar spine pain IMPRESSION: 1. Obstructing 18 mm stone in the left proximal ureter just beyond the ureteropelvic junction with mild left hydronephrosis. 2. Minimal sigmoid colonic diverticulosis without diverticulitis. 3. Small fat containing umbilical hernia. 4. Motion limited exam. Electronically Signed   By: Narda Rutherford M.D.   On: 10/05/2022 19:52    Labs:  CBC: Recent Labs    10/05/22 1656  10/06/22 0347  WBC 11.1* 11.3*  HGB 14.1 13.4  HCT 41.6 38.6  PLT 294 280    COAGS: No results for input(s): "INR", "APTT" in the last 8760 hours.  BMP: Recent Labs    10/05/22 1656 10/06/22 0347  NA 137 135  K 3.3* 3.4*  CL 100 99  CO2 27 25  GLUCOSE 100* 83  BUN 10 8  CALCIUM 8.6* 8.3*  CREATININE 0.75 0.74  GFRNONAA >60 >60    LIVER FUNCTION TESTS: Recent Labs    10/05/22 1656 10/06/22 0347  BILITOT 0.9 0.9  AST 44* 30  ALT 54* 44  ALKPHOS 136* 114  PROT 7.2 6.5  ALBUMIN 3.3* 2.8*     Assessment and Plan: Left ureteral stone with hydronephrosis. For PCN plaxement Risks and benefits of left PCN placement was discussed with the patient including, but not limited to, infection, bleeding, significant bleeding causing loss or decrease in renal function or damage to adjacent structures.   All of the patient's questions were answered, patient is agreeable to proceed.  Consent signed and in chart.   Electronically Signed: Brayton El, PA-C 10/06/2022, 8:54 AM   I spent a total of 20 minutes in face to face in clinical consultation, greater than 50% of which was counseling/coordinating care for left PCN

## 2022-10-07 DIAGNOSIS — A419 Sepsis, unspecified organism: Secondary | ICD-10-CM | POA: Diagnosis not present

## 2022-10-07 DIAGNOSIS — N390 Urinary tract infection, site not specified: Secondary | ICD-10-CM | POA: Diagnosis not present

## 2022-10-07 DIAGNOSIS — E876 Hypokalemia: Secondary | ICD-10-CM | POA: Diagnosis not present

## 2022-10-07 DIAGNOSIS — N132 Hydronephrosis with renal and ureteral calculous obstruction: Secondary | ICD-10-CM | POA: Diagnosis not present

## 2022-10-07 LAB — CBC
HCT: 37.3 % (ref 36.0–46.0)
Hemoglobin: 12.8 g/dL (ref 12.0–15.0)
MCH: 30 pg (ref 26.0–34.0)
MCHC: 34.3 g/dL (ref 30.0–36.0)
MCV: 87.4 fL (ref 80.0–100.0)
Platelets: 280 10*3/uL (ref 150–400)
RBC: 4.27 MIL/uL (ref 3.87–5.11)
RDW: 12.5 % (ref 11.5–15.5)
WBC: 9.8 10*3/uL (ref 4.0–10.5)
nRBC: 0 % (ref 0.0–0.2)

## 2022-10-07 LAB — POTASSIUM: Potassium: 4 mmol/L (ref 3.5–5.1)

## 2022-10-07 LAB — MAGNESIUM: Magnesium: 2 mg/dL (ref 1.7–2.4)

## 2022-10-07 MED ORDER — ACETAMINOPHEN 325 MG PO TABS: 650.0000 mg | ORAL_TABLET | Freq: Four times a day (QID) | ORAL | Status: AC | PRN

## 2022-10-07 MED ORDER — SODIUM CHLORIDE 0.9% FLUSH
10.0000 mL | Freq: Two times a day (BID) | INTRAVENOUS | Status: DC
  Administered 2022-10-07: 10 mL

## 2022-10-07 MED ORDER — CIPROFLOXACIN HCL 500 MG PO TABS: 500.0000 mg | ORAL_TABLET | Freq: Two times a day (BID) | ORAL | 0 refills | Status: AC

## 2022-10-07 NOTE — Plan of Care (Signed)

## 2022-10-07 NOTE — Discharge Summary (Signed)
Physician Discharge Summary   Patient: [name] MRN: [id number] DOB: [birth year]  Admit date:     10/05/2022  Discharge date: 10/07/22  Discharge Physician: Lurene Shadow   PCP: Norm Salt, PA   Recommendations at discharge:   Follow-up with Dr. Ronne Binning, urologist, in 2 weeks Follow-up with PCP in 1 week  Discharge Diagnoses: Principal Problem:   Sepsis (HCC) Active Problems:   Hydronephrosis with obstructing calculus   Elevated LFTs   Hypokalemia   Type 2 diabetes mellitus (HCC)   Acute UTI  Resolved Problems:   * No resolved hospital problems. *  Hospital Course:   [name] is a 37 y.o. female with medical history significant for s/p repair of cor triatriatum, mitral valve prolapse, hypertension, hyperlipidemia, HSV infection, herpes genitalia, gestational DM, type II DM, chronic diastolic CHF, anxiety.  She presented to the hospital because of increased frequency of micturition, fever, left flank pain and abdominal pain. She was febrile in the ED with temperature 102.3 F.       Assessment and Plan:   Sepsis secondary to acute complicated UTI, left pyelonephritis: Urine culture showed Proteus mirabilis.  She will be discharged on ciprofloxacin (based on sensitivity report) for 11 more days to complete 14 days of treatment.     Left proximal ureter obstructive stone 18 mm, left hydronephrosis: S/p placement of left nephrostomy tube.  She will see Dr. Ronne Binning, urologist, in the office for further management of left ureteral stone. Case discussed with Alwyn Ren, NP, with IR.  From her standpoint patient is okay for discharge.  Patient was provided with [facility] radiology APP office number for any assistance she might need.     Hypokalemia: Improved     Type II DM: Glucose levels are optimal   Constipation: Improved     Her condition has improved and she is deemed stable for discharge to home today.        Consultants:  Urologist, interventional radiologist Procedures performed:  S/p placement of left nephrostomy tube on 10/06/2022   Disposition: Home Diet recommendation:  Discharge Diet Orders (From admission, onward)     Start     Ordered   10/07/22 0000  Diet - low sodium heart healthy        10/07/22 1532           Cardiac diet DISCHARGE MEDICATION: Allergies as of 10/07/2022       Reactions   Mucinex [guaifenesin Er] Shortness Of Breath        Medication List     TAKE these medications    acetaminophen 500 MG tablet Commonly known as: TYLENOL Take 1,000 mg by mouth daily. What changed: Another medication with the same name was added. Make sure you understand how and when to take each.   acetaminophen 325 MG tablet Commonly known as: TYLENOL Take 2 tablets (650 mg total) by mouth every 6 (six) hours as needed for mild pain. What changed: You were already taking a medication with the same name, and this prescription was added. Make sure you understand how and when to take each.   ciprofloxacin 500 MG tablet Commonly known as: Cipro Take 1 tablet (500 mg total) by mouth 2 (two) times daily for 11 days. Start taking on: October 08, 2022   levonorgestrel 20 MCG/24HR IUD Commonly known as: MIRENA 1 each by Intrauterine route once.   magnesium citrate Soln Take 0.5 Bottles by mouth once.   MULTIVITAMIN GUMMIES WOMENS PO Take 1 capsule by mouth  daily.        Follow-up Information     McKenzie, Mardene Celeste, MD. Schedule an appointment as soon as possible for a visit in 2 week(s).   Specialty: Urology Contact information: [address] [place] [address] [phone]                Discharge Exam: Ceasar Mons Weights   10/05/22 1625  Weight: 83.9 kg    GEN: NAD SKIN: Warm and dry EYES: No pallor or icterus ENT: MMM CV: RRR PULM: CTA B ABD: soft, ND, NT, +BS CNS: AAO x 3, non focal EXT: No edema or tenderness GU: Left nephrostomy tube with brownish urine         Condition at discharge: good  The results of significant diagnostics from this hospitalization (including imaging, microbiology, ancillary and laboratory) are listed below for reference.   Imaging Studies: IR NEPHROSTOMY PLACEMENT LEFT  Result Date: 10/06/2022 INDICATION: 37 year old female with obstructing left ureteral stone referred for left PCN EXAM: IR NEPHROSTOMY PLACEMENT LEFT COMPARISON:  CT 10/05/2022 MEDICATIONS: 1 g Rocephin; The antibiotic was administered in an appropriate time frame prior to skin puncture. ANESTHESIA/SEDATION: Fentanyl 50 mcg IV; Versed 1.0 mg IV Moderate Sedation Time:  11 minutes The patient was continuously monitored during the procedure by the interventional radiology nurse under my direct supervision. CONTRAST:  8mL OMNIPAQUE IOHEXOL 300 MG/ML SOLN - administered into the collecting system(s) FLUOROSCOPY TIME:  Fluoroscopy Time: (4 mGy). COMPLICATIONS: None PROCEDURE: Informed written consent was obtained from the patient after a thorough discussion of the procedural risks, benefits and alternatives. All questions were addressed. Maximal Sterile Barrier Technique was utilized including caps, mask, sterile gowns, sterile gloves, sterile drape, hand hygiene and skin antiseptic. A timeout was performed prior to the initiation of the procedure. Patient positioned prone position on the fluoroscopy table. Ultrasound survey of the left flank was performed with images stored and sent to PACs. The patient was then prepped and draped in the usual sterile fashion. 1% lidocaine was used to anesthetize the skin and subcutaneous tissues for local anesthesia. A Chiba needle was then used to access a posterior inferior calyx with ultrasound guidance. With spontaneous urine returned through the needle, passage of an 018 micro wire into the collecting system was performed under fluoroscopy. A small incision was made with an 11 blade scalpel, and the needle was removed from the  wire. An Accustick system was then advanced over the wire into the collecting system under fluoroscopy. The metal stiffener and inner dilator were removed, and then a sample of fluid was aspirated through the 4 French outer sheath. Bentson wire was passed into the collecting system and the sheath removed. Ten French dilation of the soft tissues was performed. Using modified Seldinger technique, a 10 French pigtail catheter drain was placed over the Bentson wire. Wire and inner stiffener removed, and the pigtail was formed in the collecting system. Small amount of contrast confirmed position of the catheter. Patient tolerated the procedure well and remained hemodynamically stable throughout. No complications were encountered and no significant blood loss encountered IMPRESSION: Status post image guided left PCN. Signed, Yvone Neu. Miachel Roux, RPVI [facility] Radiology Electronically Signed   By: Gilmer Mor D.O.   On: 10/06/2022 10:12   CT ABDOMEN PELVIS W CONTRAST  Result Date: 10/05/2022 CLINICAL DATA:  Left lower quadrant abdominal pain. EXAM: CT ABDOMEN AND PELVIS WITH CONTRAST TECHNIQUE: Multidetector CT imaging of the abdomen and pelvis was  performed using the standard protocol following bolus administration of intravenous contrast. RADIATION DOSE REDUCTION: This exam was performed according to the departmental dose-optimization program which includes automated exposure control, adjustment of the mA and/or kV according to patient size and/or use of iterative reconstruction technique. CONTRAST:  75mL OMNIPAQUE IOHEXOL 350 MG/ML SOLN COMPARISON:  Noncontrast CT 05/19/2017 FINDINGS: Lower chest: Mild cardiomegaly. No basilar airspace disease or pleural effusion. Hepatobiliary: Motion artifact through the liver and gallbladder. Allowing for this, no acute findings. No calcified gallstone. No biliary dilatation. Pancreas: Motion artifact through the  pancreas. Allowing for this, no acute findings. No ductal dilatation or inflammation. Spleen: Motion artifact through the spleen. Allowing for this, no focal abnormality. No splenomegaly. Adrenals/Urinary Tract: Motion artifact through the adrenal glands and kidneys. Allowing for this, no adrenal nodule. 18 mm stone in the left proximal ureter just beyond the ureteropelvic junction with mild left hydronephrosis. There is heterogeneous enhancement of the left kidney, no discrete fluid collection. The ureter distal to the proximal ureteral stone is decompressed. No definite additional intrarenal calculi within either kidney. There is no right hydronephrosis. Partially distended urinary bladder is normal for degree of distension. Stomach/Bowel: Motion artifact limits detailed bowel assessment. The stomach is nondistended. No bowel obstruction or inflammation. Normal appendix visualized. Small volume of stool in the colon. Occasional sigmoid diverticula without diverticulitis. Vascular/Lymphatic: Normal caliber abdominal aorta. The portal vein is patent. Adenopathy assessment is limited given motion. Reproductive: Intrauterine device in the uterus, grossly normally position by CT. No adnexal mass. Other: No free air, free fluid, or intra-abdominal fluid collection. Small fat containing umbilical hernia. Musculoskeletal: There are no acute or suspicious osseous abnormalities. Mild rightward curvature of the lumbar spine pain IMPRESSION: 1. Obstructing 18 mm stone in the left proximal ureter just beyond the ureteropelvic junction with mild left hydronephrosis. 2. Minimal sigmoid colonic diverticulosis without diverticulitis. 3. Small fat containing umbilical hernia. 4. Motion limited exam. Electronically Signed   By: Narda Rutherford M.D.   On: 10/05/2022 19:52    Microbiology: Results for orders placed or performed during the hospital encounter of 10/05/22  Urine Culture     Status: Abnormal   Collection Time:  10/05/22  6:45 PM   Specimen: Urine, Clean Catch  Result Value Ref Range Status   Specimen Description URINE, CLEAN CATCH  Final   Special Requests   Final    NONE Performed at [facility], [address], [place], [place] [address]    Culture >=100,000 COLONIES/mL PROTEUS MIRABILIS (A)  Final   Report Status 10/07/2022 FINAL  Final   Organism ID, Bacteria PROTEUS MIRABILIS (A)  Final      Susceptibility   Proteus mirabilis - MIC*    AMPICILLIN <=2 SENSITIVE Sensitive     CEFAZOLIN <=4 SENSITIVE Sensitive     CEFEPIME <=0.12 SENSITIVE Sensitive     CEFTRIAXONE <=0.25 SENSITIVE Sensitive     CIPROFLOXACIN <=0.25 SENSITIVE Sensitive     GENTAMICIN <=1 SENSITIVE Sensitive     IMIPENEM 1 SENSITIVE Sensitive     NITROFURANTOIN 128 RESISTANT Resistant     TRIMETH/SULFA <=20 SENSITIVE Sensitive     AMPICILLIN/SULBACTAM <=2 SENSITIVE Sensitive     PIP/TAZO <=4 SENSITIVE Sensitive     * >=100,000 COLONIES/mL PROTEUS MIRABILIS  Aerobic/Anaerobic Culture w Gram Stain (surgical/deep wound)     Status: None (Preliminary result)   Collection Time: 10/06/22  9:32 AM   Specimen: Urine, Random  Result Value Ref Range Status   Specimen Description URINE,  RANDOM  Final   Special Requests left kidney  Final   Gram Stain   Final    RARE WBC PRESENT, PREDOMINANTLY PMN NO ORGANISMS SEEN    Culture   Final    NO GROWTH 1 DAY Performed at [facility], [address], [place], [place] [address]    Report Status PENDING  Incomplete    Labs: CBC: Recent Labs  Lab 10/05/22 1656 10/06/22 0347 10/07/22 0305  WBC 11.1* 11.3* 9.8  NEUTROABS 7.3  --   --   HGB 14.1 13.4 12.8  HCT 41.6 38.6 37.3  MCV 92.7 89.8 87.4  PLT 294 280 280   Basic Metabolic Panel: Recent Labs  Lab 10/05/22 1656 10/06/22 0347 10/07/22 0305  NA 137 135  --   K 3.3* 3.4* 4.0  CL 100 99  --   CO2 27 25  --   GLUCOSE 100* 83  --   BUN 10 8  --   CREATININE 0.75 0.74  --   CALCIUM 8.6* 8.3*  --    MG  --  2.2 2.0   Liver Function Tests: Recent Labs  Lab 10/05/22 1656 10/06/22 0347  AST 44* 30  ALT 54* 44  ALKPHOS 136* 114  BILITOT 0.9 0.9  PROT 7.2 6.5  ALBUMIN 3.3* 2.8*   CBG: No results for input(s): "GLUCAP" in the last 168 hours.  Discharge time spent: greater than 30 minutes.  Signed: Lurene Shadow, MD [facility] 10/07/2022

## 2022-10-07 NOTE — Progress Notes (Signed)
   10/07/22 1418  Spiritual Encounters  Type of Visit Initial  Care provided to: Patient  Referral source Nurse (RN/NT/LPN)  Reason for visit Advance directives  OnCall Visit No  Interventions  Spiritual Care Interventions Made Reflective listening;Other (comment);Compassionate presence Engineer, building services Education)  Intervention Outcomes  Outcomes Connection to spiritual care   Chaplain [place] Kailia Starry visited the pt to provide [facility]. The pt was welcoming upon arrival and received education regarding the Advanced Directive from the chaplain, and was given instruction to notify the Chaplain's Office through her nurse by tomorrow or sometime next week once she has completed the AD in order for the AD to be notarized.   [place] Kyiesha Millward, Orthoptist Intern  BAT PHONE [phone]

## 2022-10-07 NOTE — Plan of Care (Signed)
Problem: Nutritional: Goal: Maintenance of adequate nutrition will improve Outcome: Progressing Pt is on a heart healthy diet per MD's orders.  She has been able to tolerate her diet w/o s/sx of n/v or abdominal pain/ distention.    Problem: Skin Integrity: Goal: Risk for impaired skin integrity will decrease Outcome: Progressing Skin integrity monitored and assessed q2 hours.  Instructed pt to turn/ reposition herself q2 hours to prevent further skin impairment.  Tubes and drains assessed for device related pressures sores.  Pt is continent of both bowel and bladder.  Dressing changes performed per MD's orders.    Problem: Education: Goal: Knowledge of General Education information will improve Description: Including pain rating scale, medication(s)/side effects and non-pharmacologic comfort measures Outcome: Progressing Pt understands she was admitted into the hospital abdominal pain and fever r/t nephrolithiasis.  She has a 18mm obstructing stone to her left uteropelvic junctions with mild hydronephrosis.   Problem: Clinical Measurements: Goal: Will remain free from infection Outcome: Progressing S/Sx of infection monitored and assessed q-shift.  Pt has remained afebrile thus far.  She is on IV abx per MD's orders.     Problem: Activity: Goal: Risk for activity intolerance will decrease Outcome: Progressing Pt is independent OOB to the bathroom.  She can reposition/ turn herself independently in the bed.     Problem: Pain Managment: Goal: General experience of comfort will improve Outcome: Progressing Pt has denied pain thus far.    Problem: Safety: Goal: Ability to remain free from injury will improve Outcome: Progressing Pt has remained free from falls thus far.  Instructed pt to utilize RN call light for assistance.  Hourly rounds performed.  Bed alarm implemented to keep pt safe from falls.  Settings activated to third most sensitive mode.  Bed locked in lowest position, with  two upper/ one lower side rails engaged.  Belongings and call light within reach.

## 2022-10-07 NOTE — Progress Notes (Signed)
Referring Physician(s): McKenzie,Patrick L  Supervising Physician: Gilmer Mor  Patient Status:  [facility] - In-pt  Chief Complaint: Left ureteral obstruction with hydronephrosis s/p left PCN in IR 10/06/22  Subjective: Patient resting comfortably in bed. She denies any significant pain or discomfort. She feels much better today.   Allergies: Mucinex [guaifenesin er]  Medications: Prior to Admission medications   Medication Sig Start Date End Date Taking? Authorizing Provider  acetaminophen (TYLENOL) 500 MG tablet Take 1,000 mg by mouth daily.   Yes [provider]  levonorgestrel (MIRENA) 20 MCG/24HR IUD 1 each by Intrauterine route once.   Yes [provider]  magnesium citrate SOLN Take 0.5 Bottles by mouth once.   Yes [provider]  Multiple Vitamins-Minerals (MULTIVITAMIN GUMMIES WOMENS PO) Take 1 capsule by mouth daily.   Yes [provider]     Vital Signs: BP (!) 113/59 (BP Location: Right Arm)   Pulse 75   Temp 98.5 F (36.9 C) (Oral)   Resp 17   Ht 4\' 10"  (1.473 m)   Wt 184 lb 15.5 oz (83.9 kg)   SpO2 97%   BMI 38.66 kg/m   Physical Exam Constitutional:      General: She is not in acute distress.    Appearance: She is not ill-appearing.  Pulmonary:     Effort: Pulmonary effort is normal.  Abdominal:     Comments: Left PCN. Mild tenderness to palpation at the drain insertion site. Dressing is clean and dry. Approximately 100 ml of lightly blood-tinged urine in gravity bag. Drain easily flushed with 5 ml NS.   Skin:    General: Skin is warm and dry.  Neurological:     Mental Status: She is alert and oriented to person, place, and time.     Imaging: IR NEPHROSTOMY PLACEMENT LEFT  Result Date: 10/06/2022 INDICATION: 37 year old female with obstructing left ureteral stone referred for left PCN EXAM: IR NEPHROSTOMY PLACEMENT LEFT COMPARISON:  CT 10/05/2022 MEDICATIONS: 1 g Rocephin; The antibiotic was administered in an  appropriate time frame prior to skin puncture. ANESTHESIA/SEDATION: Fentanyl 50 mcg IV; Versed 1.0 mg IV Moderate Sedation Time:  11 minutes The patient was continuously monitored during the procedure by the interventional radiology nurse under my direct supervision. CONTRAST:  8mL OMNIPAQUE IOHEXOL 300 MG/ML SOLN - administered into the collecting system(s) FLUOROSCOPY TIME:  Fluoroscopy Time: (4 mGy). COMPLICATIONS: None PROCEDURE: Informed written consent was obtained from the patient after a thorough discussion of the procedural risks, benefits and alternatives. All questions were addressed. Maximal Sterile Barrier Technique was utilized including caps, mask, sterile gowns, sterile gloves, sterile drape, hand hygiene and skin antiseptic. A timeout was performed prior to the initiation of the procedure. Patient positioned prone position on the fluoroscopy table. Ultrasound survey of the left flank was performed with images stored and sent to PACs. The patient was then prepped and draped in the usual sterile fashion. 1% lidocaine was used to anesthetize the skin and subcutaneous tissues for local anesthesia. A Chiba needle was then used to access a posterior inferior calyx with ultrasound guidance. With spontaneous urine returned through the needle, passage of an 018 micro wire into the collecting system was performed under fluoroscopy. A small incision was made with an 11 blade scalpel, and the needle was removed from the wire. An Accustick system was then advanced over the wire into the collecting system under fluoroscopy. The metal stiffener and inner dilator were removed, and then a sample of fluid was  aspirated through the 4 French outer sheath. Bentson wire was passed into the collecting system and the sheath removed. Ten French dilation of the soft tissues was performed. Using modified Seldinger technique, a 10 French pigtail catheter drain was placed over the Bentson wire. Wire and inner stiffener removed,  and the pigtail was formed in the collecting system. Small amount of contrast confirmed position of the catheter. Patient tolerated the procedure well and remained hemodynamically stable throughout. No complications were encountered and no significant blood loss encountered IMPRESSION: Status post image guided left PCN. Signed, Yvone Neu. Miachel Roux, RPVI [facility] Radiology Electronically Signed   By: Gilmer Mor D.O.   On: 10/06/2022 10:12   CT ABDOMEN PELVIS W CONTRAST  Result Date: 10/05/2022 CLINICAL DATA:  Left lower quadrant abdominal pain. EXAM: CT ABDOMEN AND PELVIS WITH CONTRAST TECHNIQUE: Multidetector CT imaging of the abdomen and pelvis was performed using the standard protocol following bolus administration of intravenous contrast. RADIATION DOSE REDUCTION: This exam was performed according to the departmental dose-optimization program which includes automated exposure control, adjustment of the mA and/or kV according to patient size and/or use of iterative reconstruction technique. CONTRAST:  75mL OMNIPAQUE IOHEXOL 350 MG/ML SOLN COMPARISON:  Noncontrast CT 05/19/2017 FINDINGS: Lower chest: Mild cardiomegaly. No basilar airspace disease or pleural effusion. Hepatobiliary: Motion artifact through the liver and gallbladder. Allowing for this, no acute findings. No calcified gallstone. No biliary dilatation. Pancreas: Motion artifact through the pancreas. Allowing for this, no acute findings. No ductal dilatation or inflammation. Spleen: Motion artifact through the spleen. Allowing for this, no focal abnormality. No splenomegaly. Adrenals/Urinary Tract: Motion artifact through the adrenal glands and kidneys. Allowing for this, no adrenal nodule. 18 mm stone in the left proximal ureter just beyond the ureteropelvic junction with mild left hydronephrosis. There is heterogeneous enhancement of the left kidney, no discrete fluid collection. The  ureter distal to the proximal ureteral stone is decompressed. No definite additional intrarenal calculi within either kidney. There is no right hydronephrosis. Partially distended urinary bladder is normal for degree of distension. Stomach/Bowel: Motion artifact limits detailed bowel assessment. The stomach is nondistended. No bowel obstruction or inflammation. Normal appendix visualized. Small volume of stool in the colon. Occasional sigmoid diverticula without diverticulitis. Vascular/Lymphatic: Normal caliber abdominal aorta. The portal vein is patent. Adenopathy assessment is limited given motion. Reproductive: Intrauterine device in the uterus, grossly normally position by CT. No adnexal mass. Other: No free air, free fluid, or intra-abdominal fluid collection. Small fat containing umbilical hernia. Musculoskeletal: There are no acute or suspicious osseous abnormalities. Mild rightward curvature of the lumbar spine pain IMPRESSION: 1. Obstructing 18 mm stone in the left proximal ureter just beyond the ureteropelvic junction with mild left hydronephrosis. 2. Minimal sigmoid colonic diverticulosis without diverticulitis. 3. Small fat containing umbilical hernia. 4. Motion limited exam. Electronically Signed   By: Narda Rutherford M.D.   On: 10/05/2022 19:52    Labs:  CBC: Recent Labs    10/05/22 1656 10/06/22 0347 10/07/22 0305  WBC 11.1* 11.3* 9.8  HGB 14.1 13.4 12.8  HCT 41.6 38.6 37.3  PLT 294 280 280    COAGS: No results for input(s): "INR", "APTT" in the last 8760 hours.  BMP: Recent Labs    10/05/22 1656 10/06/22 0347 10/07/22 0305  NA 137 135  --   K 3.3* 3.4* 4.0  CL 100 99  --   CO2 27 25  --   GLUCOSE 100* 83  --  BUN 10 8  --   CALCIUM 8.6* 8.3*  --   CREATININE 0.75 0.74  --   GFRNONAA >60 >60  --     LIVER FUNCTION TESTS: Recent Labs    10/05/22 1656 10/06/22 0347  BILITOT 0.9 0.9  AST 44* 30  ALT 54* 44  ALKPHOS 136* 114  PROT 7.2 6.5  ALBUMIN 3.3* 2.8*     Assessment and Plan:   Left ureteral obstruction with hydronephrosis s/p left PCN in IR 10/06/22  1100 ml PCN output since placement. Labs and vitals are within normal limits. Patient states Urology is planning for definitive stone treatment in 2 weeks. Home drain care briefly discussed with the patient. While inpatient the drain needs to be flushed once per shift with 5-10 ml NS. The dressing should be changed daily or as needed if it becomes soiled.   Additional plans per Urology/Primary teams.   IR will continue to follow.   Electronically Signed: Alwyn Ren, AGACNP-BC [phone] 10/07/2022, 8:37 AM   I spent a total of 15 Minutes at the the patient's bedside AND on the patient's hospital floor or unit, greater than 50% of which was counseling/coordinating care for left PCN

## 2022-10-07 NOTE — Progress Notes (Signed)
Pt d/c to self care. Reviewed AVS with pt and informed her of changes to her medication list. Made pt aware that she had pending prescriptions to be picked up from her preferred pharmacy. Educated and taught pt on PCN care and how to flush/ change her PCN dressing.  Answered any pending questions. Pt had no further questions. Removed PIV which was CDI and free from s/sx of infection upon removal. Assisted pt in getting dressed and gathering her belongings. Assisted pt into wheelchair where she was escorted accompanied by Care RN and her family to the front of the visitors entrance where her ride awaited to take her home. Pt d/c in stable condition.

## 2022-10-12 LAB — AEROBIC/ANAEROBIC CULTURE W GRAM STAIN (SURGICAL/DEEP WOUND)

## 2022-10-17 ENCOUNTER — Encounter (HOSPITAL_COMMUNITY): Payer: Self-pay | Admitting: Emergency Medicine

## 2022-10-17 ENCOUNTER — Emergency Department (HOSPITAL_COMMUNITY)
Admission: EM | Admit: 2022-10-17 | Discharge: 2022-10-17 | Disposition: A | Discharge status: Home / Self Care | Payer: Medicaid Other | Attending: Emergency Medicine | Admitting: Emergency Medicine

## 2022-10-17 ENCOUNTER — Other Ambulatory Visit: Payer: Self-pay

## 2022-10-17 DIAGNOSIS — T83092A Other mechanical complication of nephrostomy catheter, initial encounter: Secondary | ICD-10-CM | POA: Diagnosis present

## 2022-10-17 DIAGNOSIS — N99520 Hemorrhage of other external stoma of urinary tract: Secondary | ICD-10-CM | POA: Diagnosis not present

## 2022-10-17 DIAGNOSIS — Z436 Encounter for attention to other artificial openings of urinary tract: Secondary | ICD-10-CM | POA: Insufficient documentation

## 2022-10-17 LAB — CBC WITH DIFFERENTIAL/PLATELET
Abs Immature Granulocytes: 0.06 10*3/uL (ref 0.00–0.07)
Basophils Absolute: 0.1 10*3/uL (ref 0.0–0.1)
Basophils Relative: 1 %
Eosinophils Absolute: 0.5 10*3/uL (ref 0.0–0.5)
Eosinophils Relative: 5 %
HCT: 40.7 % (ref 36.0–46.0)
Hemoglobin: 13.5 g/dL (ref 12.0–15.0)
Immature Granulocytes: 1 %
Lymphocytes Relative: 30 %
Lymphs Abs: 3.1 10*3/uL (ref 0.7–4.0)
MCH: 29.8 pg (ref 26.0–34.0)
MCHC: 33.2 g/dL (ref 30.0–36.0)
MCV: 89.8 fL (ref 80.0–100.0)
Monocytes Absolute: 0.8 10*3/uL (ref 0.1–1.0)
Monocytes Relative: 8 %
Neutro Abs: 5.8 10*3/uL (ref 1.7–7.7)
Neutrophils Relative %: 55 %
Platelets: 583 10*3/uL — ABNORMAL HIGH (ref 150–400)
RBC: 4.53 MIL/uL (ref 3.87–5.11)
RDW: 12.6 % (ref 11.5–15.5)
WBC: 10.3 10*3/uL (ref 4.0–10.5)
nRBC: 0 % (ref 0.0–0.2)

## 2022-10-17 LAB — URINALYSIS, ROUTINE W REFLEX MICROSCOPIC
Bilirubin Urine: NEGATIVE
Glucose, UA: NEGATIVE mg/dL
Ketones, ur: NEGATIVE mg/dL
Nitrite: NEGATIVE
Protein, ur: NEGATIVE mg/dL
RBC / HPF: >50 RBC/hpf (ref 0–5)
Specific Gravity, Urine: 1.014 (ref 1.005–1.030)
pH: 7 (ref 5.0–8.0)

## 2022-10-17 LAB — BASIC METABOLIC PANEL
Anion gap: 10 (ref 5–15)
BUN: 8 mg/dL (ref 6–20)
CO2: 25 mmol/L (ref 22–32)
Calcium: 9.1 mg/dL (ref 8.9–10.3)
Chloride: 103 mmol/L (ref 98–111)
Creatinine, Ser: 0.61 mg/dL (ref 0.44–1.00)
GFR, Estimated: >60 mL/min (ref >60)
Glucose, Bld: 112 mg/dL — ABNORMAL HIGH (ref 70–99)
Potassium: 3.9 mmol/L (ref 3.5–5.1)
Sodium: 138 mmol/L (ref 135–145)

## 2022-10-17 NOTE — ED Provider Notes (Signed)
[facility]-EMERGENCY DEPT [facility] Emergency Department Provider Note MRN:  [id number]  Arrival date & time: 10/17/22     Chief Complaint   Neprhostomy Problem   History of Present Illness   [name] is a 37 y.o. year-old female presents to the ED with chief complaint of bleeding from urostomy.  She states that she was recently admitted for urosepsis.  She states that the urostomy has still been draining, but it is draining blood.  She states that her pain has been fairly well-controlled.  She denies any new fevers.  History provided by patient.   Review of Systems  Pertinent positive and negative review of systems noted in HPI.    Physical Exam   Vitals:   10/17/22 0037  BP: (!) 144/82  Pulse: 88  Resp: 18  Temp: 97.8 F (36.6 C)  SpO2: 98%    CONSTITUTIONAL:  non toxic-appearing, NAD NEURO:  Alert and oriented x 3, CN 3-12 grossly intact EYES:  eyes equal and reactive ENT/NECK:  Supple, no stridor  CARDIO:  normal rate, regular rhythm, appears well-perfused  PULM:  No respiratory distress,  GI/GU:  non-distended, urostomy present with blood in tubing MSK/SPINE:  No gross deformities, no edema, moves all extremities  SKIN:  no rash, atraumatic   *Additional and/or pertinent findings included in MDM below  Diagnostic and Interventional Summary    EKG Interpretation Date/Time:    Ventricular Rate:    PR Interval:    QRS Duration:    QT Interval:    QTC Calculation:   R Axis:      Text Interpretation:         Labs Reviewed  URINALYSIS, ROUTINE W REFLEX MICROSCOPIC - Abnormal; Notable for the following components:      Result Value   Hgb urine dipstick LARGE (*)    Leukocytes,Ua SMALL (*)    Bacteria, UA FEW (*)    All other components within normal limits  CBC WITH DIFFERENTIAL/PLATELET - Abnormal; Notable for the following components:   Platelets 583 (*)    All other components within normal limits  BASIC METABOLIC PANEL - Abnormal;  Notable for the following components:   Glucose, Bld 112 (*)    All other components within normal limits    No orders to display    Medications - No data to display   Procedures  /  Critical Care Procedures  ED Course and Medical Decision Making  I have reviewed the triage vital signs, the nursing notes, and pertinent available records from the EMR.  Social Determinants Affecting Complexity of Care: Patient has no clinically significant social determinants affecting this chief complaint..   ED Course:    Medical Decision Making Urostomy is draining without difficulty.  There is blood in the tube, but is not clotted.  She has saline rinses at home.  She is on her antibiotic and has been compliant with taking the medications.  Denies fever or vomiting.  Creatinine is reassuring.  Amount and/or Complexity of Data Reviewed Labs: ordered.    Details: Creatinine is normal         Consultants: No consultations were needed in caring for this patient.   Treatment and Plan: Emergency department workup does not suggest an emergent condition requiring admission or immediate intervention beyond  what has been performed at this time. The patient is safe for discharge and has  been instructed to return immediately for worsening symptoms, change in  symptoms or any other concerns  Patient discussed  with attending physician, Dr. Adela Lank, who recommends discharge and outpatient urology follow-up.  Final Clinical Impressions(s) / ED Diagnoses     ICD-10-CM   1. Attention to urostomy [facility])  Z43.6       ED Discharge Orders     None         Discharge Instructions Discussed with and Provided to Patient:     Discharge Instructions      You blood work looked good.  Continue your antibiotics.  Follow-up as directed.       Roxy Horseman, PA-C 10/17/22 0348    Melene Plan, DO 10/17/22 0500

## 2022-10-17 NOTE — Discharge Instructions (Signed)
You blood work looked good.  Continue your antibiotics.  Follow-up as directed.

## 2022-10-17 NOTE — ED Triage Notes (Signed)
Pt presents with nephrostomy in place after recent admission. Was to follow up with urology next week. Tonight pt noticed blood in her drainage tube as well as a particulate and was concerned.  Pt alert and oriented, in NAD. No pain or any other symptoms.

## 2022-10-25 DIAGNOSIS — N201 Calculus of ureter: Secondary | ICD-10-CM | POA: Diagnosis not present

## 2022-10-29 ENCOUNTER — Other Ambulatory Visit: Payer: Self-pay | Admitting: Urology

## 2022-10-30 ENCOUNTER — Other Ambulatory Visit (HOSPITAL_COMMUNITY): Payer: Self-pay | Admitting: Urology

## 2022-10-30 DIAGNOSIS — N201 Calculus of ureter: Secondary | ICD-10-CM

## 2022-11-15 ENCOUNTER — Encounter (HOSPITAL_COMMUNITY): Payer: Self-pay

## 2022-11-15 ENCOUNTER — Other Ambulatory Visit: Payer: Self-pay | Admitting: Student

## 2022-11-15 DIAGNOSIS — N132 Hydronephrosis with renal and ureteral calculous obstruction: Secondary | ICD-10-CM

## 2022-11-15 NOTE — Patient Instructions (Signed)
DUE TO COVID-19 ONLY TWO VISITORS  (aged 37 and older)  ARE ALLOWED TO COME WITH YOU AND STAY IN THE WAITING ROOM ONLY DURING PRE OP AND PROCEDURE.   **NO VISITORS ARE ALLOWED IN THE SHORT STAY AREA OR RECOVERY ROOM!!**  IF YOU WILL BE ADMITTED INTO THE HOSPITAL YOU ARE ALLOWED ONLY FOUR SUPPORT PEOPLE DURING VISITATION HOURS ONLY (7 AM -8PM)   The support person(s) must pass our screening, gel in and out, and wear a mask at all times, including in the patient's room. Patients must also wear a mask when staff or their support person are in the room. Visitors GUEST BADGE MUST BE WORN VISIBLY  One adult visitor may remain with you overnight and MUST be in the room by 8 P.M.     Your procedure is scheduled on: 11/19/22   Report to [facility] Main Entrance    Report to admitting at : 5:15 AM   Call this number if you have problems the morning of surgery [phone]   Do not eat food or drink : After Midnight.  FOLLOW ANY ADDITIONAL PRE OP INSTRUCTIONS YOU RECEIVED FROM YOUR SURGEON'S OFFICE!!!   Oral Hygiene is also important to reduce your risk of infection.                                    Remember - BRUSH YOUR TEETH THE MORNING OF SURGERY WITH YOUR REGULAR TOOTHPASTE  DENTURES WILL BE REMOVED PRIOR TO SURGERY PLEASE DO NOT APPLY "Poly grip" OR ADHESIVES!!!   Do NOT smoke after Midnight   Take these medicines the morning of surgery with A SIP OF WATER: N/A. Tylenol as needed.  DO NOT TAKE ANY ORAL DIABETIC MEDICATIONS DAY OF YOUR SURGERY                              You may not have any metal on your body including hair pins, jewelry, and body piercing             Do not wear make-up, lotions, powders, perfumes/cologne, or deodorant  Do not wear nail polish including gel and S&S, artificial/acrylic nails, or any other type of covering on natural nails including finger and toenails. If you have artificial nails, gel coating, etc. that needs to be removed by a nail salon  please have this removed prior to surgery or surgery may need to be canceled/ delayed if the surgeon/ anesthesia feels like they are unable to be safely monitored.   Do not shave  48 hours prior to surgery.    Do not bring valuables to the hospital. [facility] IS NOT             RESPONSIBLE   FOR VALUABLES.   Contacts, glasses, or bridgework may not be worn into surgery.   Bring small overnight bag day of surgery.   DO NOT BRING YOUR HOME MEDICATIONS TO THE HOSPITAL. PHARMACY WILL DISPENSE MEDICATIONS LISTED ON YOUR MEDICATION LIST TO YOU DURING YOUR ADMISSION IN THE HOSPITAL!    Patients discharged on the day of surgery will not be allowed to drive home.  Someone NEEDS to stay with you for the first 24 hours after anesthesia.   Special Instructions: Bring a copy of your healthcare power of attorney and living will documents         the day  of surgery if you haven't scanned them before.              Please read over the following fact sheets you were given: IF YOU HAVE QUESTIONS ABOUT YOUR PRE-OP INSTRUCTIONS PLEASE CALL [phone]    [facility] - Preparing for Surgery Before surgery, you can play an important role.  Because skin is not sterile, your skin needs to be as free of germs as possible.  You can reduce the number of germs on your skin by washing with CHG (chlorahexidine gluconate) soap before surgery.  CHG is an antiseptic cleaner which kills germs and bonds with the skin to continue killing germs even after washing. Please DO NOT use if you have an allergy to CHG or antibacterial soaps.  If your skin becomes reddened/irritated stop using the CHG and inform your nurse when you arrive at Short Stay. Do not shave (including legs and underarms) for at least 48 hours prior to the first CHG shower.  You may shave your face/neck. Please follow these instructions carefully:  1.  Shower with CHG Soap the night before surgery and the  morning of Surgery.  2.  If you choose to wash your  hair, wash your hair first as usual with your  normal  shampoo.  3.  After you shampoo, rinse your hair and body thoroughly to remove the  shampoo.                           4.  Use CHG as you would any other liquid soap.  You can apply chg directly  to the skin and wash                       Gently with a scrungie or clean washcloth.  5.  Apply the CHG Soap to your body ONLY FROM THE NECK DOWN.   Do not use on face/ open                           Wound or open sores. Avoid contact with eyes, ears mouth and genitals (private parts).                       Wash face,  Genitals (private parts) with your normal soap.             6.  Wash thoroughly, paying special attention to the area where your surgery  will be performed.  7.  Thoroughly rinse your body with warm water from the neck down.  8.  DO NOT shower/wash with your normal soap after using and rinsing off  the CHG Soap.                9.  Pat yourself dry with a clean towel.            10.  Wear clean pajamas.            11.  Place clean sheets on your bed the night of your first shower and do not  sleep with pets. Day of Surgery : Do not apply any lotions/deodorants the morning of surgery.  Please wear clean clothes to the hospital/surgery center.  FAILURE TO FOLLOW THESE INSTRUCTIONS MAY RESULT IN THE CANCELLATION OF YOUR SURGERY PATIENT SIGNATURE_________________________________  NURSE SIGNATURE__________________________________  ________________________________________________________________________  WHAT IS A BLOOD TRANSFUSION? Blood Transfusion Information  A transfusion is  the replacement of blood or some of its parts. Blood is made up of multiple cells which provide different functions. Red blood cells carry oxygen and are used for blood loss replacement. White blood cells fight against infection. Platelets control bleeding. Plasma helps clot blood. Other blood products are available for specialized needs, such as hemophilia  or other clotting disorders. BEFORE THE TRANSFUSION  Who gives blood for transfusions?  Healthy volunteers who are fully evaluated to make sure their blood is safe. This is blood bank blood. Transfusion therapy is the safest it has ever been in the practice of medicine. Before blood is taken from a donor, a complete history is taken to make sure that person has no history of diseases nor engages in risky social behavior (examples are intravenous drug use or sexual activity with multiple partners). The donor's travel history is screened to minimize risk of transmitting infections, such as malaria. The donated blood is tested for signs of infectious diseases, such as HIV and hepatitis. The blood is then tested to be sure it is compatible with you in order to minimize the chance of a transfusion reaction. If you or a relative donates blood, this is often done in anticipation of surgery and is not appropriate for emergency situations. It takes many days to process the donated blood. RISKS AND COMPLICATIONS Although transfusion therapy is very safe and saves many lives, the main dangers of transfusion include:  Getting an infectious disease. Developing a transfusion reaction. This is an allergic reaction to something in the blood you were given. Every precaution is taken to prevent this. The decision to have a blood transfusion has been considered carefully by your caregiver before blood is given. Blood is not given unless the benefits outweigh the risks. AFTER THE TRANSFUSION Right after receiving a blood transfusion, you will usually feel much better and more energetic. This is especially true if your red blood cells have gotten low (anemic). The transfusion raises the level of the red blood cells which carry oxygen, and this usually causes an energy increase. The nurse administering the transfusion will monitor you carefully for complications. HOME CARE INSTRUCTIONS  No special instructions are needed  after a transfusion. You may find your energy is better. Speak with your caregiver about any limitations on activity for underlying diseases you may have. SEEK MEDICAL CARE IF:  Your condition is not improving after your transfusion. You develop redness or irritation at the intravenous (IV) site. SEEK IMMEDIATE MEDICAL CARE IF:  Any of the following symptoms occur over the next 12 hours: Shaking chills. You have a temperature by mouth above 102 F (38.9 C), not controlled by medicine. Chest, back, or muscle pain. People around you feel you are not acting correctly or are confused. Shortness of breath or difficulty breathing. Dizziness and fainting. You get a rash or develop hives. You have a decrease in urine output. Your urine turns a dark color or changes to pink, red, or brown. Any of the following symptoms occur over the next 10 days: You have a temperature by mouth above 102 F (38.9 C), not controlled by medicine. Shortness of breath. Weakness after normal activity. The white part of the eye turns yellow (jaundice). You have a decrease in the amount of urine or are urinating less often. Your urine turns a dark color or changes to pink, red, or brown. Document Released: 02/24/2000 Document Revised: 05/21/2011 Document Reviewed: 10/13/2007 [facility] Patient Information 2014 [place], [place].  _______________________________________________________________________

## 2022-11-15 NOTE — H&P (Signed)
Chief Complaint: Left sided obstruction kidney stone s/p left sided nephrostomy tube placement. Request is for left sided nephrostomy tube to nephroureteral tube conversion   Referring Physician(s): Bell,Eugene D III  Supervising Physician: Simonne Come  Patient Status: [facility] - Out-pt  History of Present Illness: [name] is a 37 y.o. female outpatient. History of HTN, HLD, DM, CHF.  Presented to the ED on 7.26.24 with LLQ pain. Found to have an obstructing  left ureteral stone. IR placed a 10 Fr. Left nephrostomy tube on 7.27.24. Team is requesting a left nephrostomy tube to nephroureteral conversion for left nephrolithotomy on 9.9.24 with Dr. Langston Masker   Currently without any significant complaints. Patient alert and laying in bed,calm. Denies any fevers, headache, chest pain, SOB, cough, abdominal pain, nausea, vomiting or bleeding. Return precautions and treatment recommendations and follow-up discussed with the patient  who is agreeable with the plan.    Past Medical History:  Diagnosis Date   Abnormal Pap smear    Anxiety    on meds   CHF (congestive heart failure) (HCC)    DM2 (diabetes mellitus, type 2) (HCC)    patient denies   Gestational diabetes    Healthcare-associated pneumonia 10/31/2012   Herpes genitalia    HSV (herpes simplex virus) infection    last outbreak in may   Hyperlipidemia    Hypertension    Infection    UTI   Mitral valve prolapse    Obesity    Status post repair of cor triatriatum    Echocardiogram 12/05: EF 55-65%, no mitral valve prolapse, mild RVE and no mention of any residual ASD.      Past Surgical History:  Procedure Laterality Date   CARDIAC SURGERY     congenital heart defect repair at 71 mos   CESAREAN SECTION N/A 10/06/2012   Procedure: Primary CESAREAN SECTION  of baby girl at 0347 APGAR 8/8;  Surgeon: Philip Aspen, DO;  Location: [facility] ORS;  Service: Obstetrics;  Laterality: N/A;   EXPLORATION POST OPERATIVE OPEN HEART      IR NEPHROSTOMY PLACEMENT LEFT  10/06/2022   LAPAROTOMY N/A 10/07/2012   Procedure: EXPLORATORY LAPAROTOMY;  Surgeon: Freddrick March. Tenny Craw, MD;  Location: [facility] ORS;  Service: Gynecology;  Laterality: N/A;    Allergies: Mucinex [guaifenesin er]  Medications: Prior to Admission medications   Medication Sig Start Date End Date Taking? Authorizing Provider  acetaminophen (TYLENOL) 325 MG tablet Take 2 tablets (650 mg total) by mouth every 6 (six) hours as needed for mild pain. 10/07/22   Lurene Shadow, MD  levonorgestrel (MIRENA) 20 MCG/24HR IUD 1 each by Intrauterine route once.    [provider]  Multiple Vitamin (MULTIVITAMIN WITH MINERALS) TABS tablet Take 1 tablet by mouth in the morning.    [provider]     Family History  Problem Relation Age of Onset   Diabetes Mother    Asthma Mother    Diabetes Father    Asthma Father    Diabetes Maternal Grandmother    Diabetes Paternal Grandfather     Social History   Socioeconomic History   Marital status: Married    Spouse name: Not on file   Number of children: Not on file   Years of education: Not on file   Highest education level: Not on file  Occupational History   Not on file  Tobacco Use   Smoking status: Former    Current packs/day: 0.50    Types: Cigarettes  Passive exposure: Current   Smokeless tobacco: Never  Vaping Use   Vaping status: Some Days  Substance and Sexual Activity   Alcohol use: Yes    Alcohol/week: 0.0 standard drinks of alcohol    Comment: social   Drug use: No   Sexual activity: Yes    Birth control/protection: None  Other Topics Concern   Not on file  Social History Narrative   Not on file   Social Determinants of Health   Financial Resource Strain: Not on file  Food Insecurity: No Food Insecurity (10/05/2022)   Hunger Vital Sign    Worried About Running Out of Food in the Last Year: Never true    Ran Out of Food in the Last Year: Never true  Transportation Needs: No  Transportation Needs (10/05/2022)   PRAPARE - Administrator, Civil Service (Medical): No    Lack of Transportation (Non-Medical): No  Physical Activity: Not on file  Stress: Not on file  Social Connections: Not on file     Review of Systems: A 12 point ROS discussed and pertinent positives are indicated in the HPI above.  All other systems are negative.  Review of Systems  Constitutional:  Negative for fatigue and fever.  HENT:  Negative for congestion.   Respiratory:  Negative for cough and shortness of breath.   Gastrointestinal:  Negative for abdominal pain, diarrhea, nausea and vomiting.    Vital Signs: BP (!) 151/102   Pulse 83   Temp 98.1 F (36.7 C) (Oral)   Resp 18   Ht 4\' 11"  (1.499 m)   Wt 184 lb 15.5 oz (83.9 kg)   SpO2 96%   BMI 37.36 kg/m     Physical Exam Vitals and nursing note reviewed.  Constitutional:      Appearance: She is well-developed. She is obese.  HENT:     Head: Normocephalic and atraumatic.  Eyes:     Conjunctiva/sclera: Conjunctivae normal.  Cardiovascular:     Rate and Rhythm: Normal rate and regular rhythm.     Heart sounds: Normal heart sounds.  Pulmonary:     Effort: Pulmonary effort is normal.     Breath sounds: Normal breath sounds.  Genitourinary:    Comments:  Left sided nephrostomy tube Musculoskeletal:        General: Normal range of motion.     Cervical back: Normal range of motion.  Skin:    General: Skin is warm.  Neurological:     General: No focal deficit present.     Mental Status: She is alert and oriented to person, place, and time.  Psychiatric:        Mood and Affect: Mood normal.        Behavior: Behavior normal.        Thought Content: Thought content normal.        Judgment: Judgment normal.     Imaging: No results found.  Labs:  CBC: Recent Labs    10/05/22 1656 10/06/22 0347 10/07/22 0305 10/17/22 0232  WBC 11.1* 11.3* 9.8 10.3  HGB 14.1 13.4 12.8 13.5  HCT 41.6 38.6 37.3  40.7  PLT 294 280 280 583*    COAGS: No results for input(s): "INR", "APTT" in the last 8760 hours.  BMP: Recent Labs    10/05/22 1656 10/06/22 0347 10/07/22 0305 10/17/22 0232  NA 137 135  --  138  K 3.3* 3.4* 4.0 3.9  CL 100 99  --  103  CO2 27  25  --  25  GLUCOSE 100* 83  --  112*  BUN 10 8  --  8  CALCIUM 8.6* 8.3*  --  9.1  CREATININE 0.75 0.74  --  0.61  GFRNONAA >60 >60  --  >60    LIVER FUNCTION TESTS: Recent Labs    10/05/22 1656 10/06/22 0347  BILITOT 0.9 0.9  AST 44* 30  ALT 54* 44  ALKPHOS 136* 114  PROT 7.2 6.5  ALBUMIN 3.3* 2.8*     Assessment and Plan:  37 y.o female outpatient. History of HTN, HLD, DM, CHF.  Presented to the ED on 7.26.24 with LLQ pain. Found to have an obstructing  left ureteral stone. IR placed a 10 Fr. Left nephrostomy tube on 7.27.24. Team is requesting a left nephrostomy tube to nephroureteral conversion for left nephrolithotomy on 9.9.24 with Dr. Langston Masker   No recent labs. All medications are within acceptable parameters. No pertinent allergies. Patient has been NPO since midnight.   Risks and benefits of left  PCN tp nephroureteral conversion  was discussed with the patient including, but not limited to, infection, bleeding, significant bleeding causing loss or decrease in renal function or damage to adjacent structures.   All of the patient's questions were answered, patient is agreeable to proceed.  Consent signed and in chart.   Thank you for this interesting consult.  I greatly enjoyed meeting Mabeline Hingson and look forward to participating in their care.  A copy of this report was sent to the requesting provider on this date.  Electronically Signed: Alene Mires, NP 11/16/2022, 8:59 AM   I spent a total of  30 Minutes   in face to face in clinical consultation, greater than 50% of which was counseling/coordinating care for left nephrostomy to nephroureteral conversion.

## 2022-11-16 ENCOUNTER — Encounter (HOSPITAL_COMMUNITY): Payer: Self-pay

## 2022-11-16 ENCOUNTER — Ambulatory Visit (HOSPITAL_COMMUNITY)
Admission: RE | Admit: 2022-11-16 | Discharge: 2022-11-16 | Disposition: A | Discharge status: Home / Self Care | Payer: Medicaid Other | Source: Ambulatory Visit | Attending: Urology

## 2022-11-16 ENCOUNTER — Other Ambulatory Visit: Payer: Self-pay

## 2022-11-16 ENCOUNTER — Encounter (HOSPITAL_COMMUNITY)
Admission: RE | Admit: 2022-11-16 | Discharge: 2022-11-16 | Disposition: A | Discharge status: Home / Self Care | Payer: Medicaid Other | Source: Ambulatory Visit | Attending: Urology | Admitting: Urology

## 2022-11-16 DIAGNOSIS — I110 Hypertensive heart disease with heart failure: Secondary | ICD-10-CM | POA: Diagnosis not present

## 2022-11-16 DIAGNOSIS — I509 Heart failure, unspecified: Secondary | ICD-10-CM | POA: Insufficient documentation

## 2022-11-16 DIAGNOSIS — E119 Type 2 diabetes mellitus without complications: Secondary | ICD-10-CM

## 2022-11-16 DIAGNOSIS — E785 Hyperlipidemia, unspecified: Secondary | ICD-10-CM | POA: Diagnosis not present

## 2022-11-16 DIAGNOSIS — Z436 Encounter for attention to other artificial openings of urinary tract: Secondary | ICD-10-CM | POA: Diagnosis not present

## 2022-11-16 DIAGNOSIS — N201 Calculus of ureter: Secondary | ICD-10-CM | POA: Diagnosis not present

## 2022-11-16 DIAGNOSIS — Z87891 Personal history of nicotine dependence: Secondary | ICD-10-CM | POA: Insufficient documentation

## 2022-11-16 DIAGNOSIS — Z01818 Encounter for other preprocedural examination: Secondary | ICD-10-CM

## 2022-11-16 DIAGNOSIS — N132 Hydronephrosis with renal and ureteral calculous obstruction: Secondary | ICD-10-CM

## 2022-11-16 HISTORY — PX: IR CONVERT LEFT NEPHROSTOMY TO NEPHROURETERAL CATH: IMG6067

## 2022-11-16 HISTORY — DX: Cardiac murmur, unspecified: R01.1

## 2022-11-16 LAB — BASIC METABOLIC PANEL
Anion gap: 10 (ref 5–15)
BUN: 9 mg/dL (ref 6–20)
CO2: 24 mmol/L (ref 22–32)
Calcium: 8.4 mg/dL — ABNORMAL LOW (ref 8.9–10.3)
Chloride: 103 mmol/L (ref 98–111)
Creatinine, Ser: 0.53 mg/dL (ref 0.44–1.00)
GFR, Estimated: >60 mL/min (ref >60)
Glucose, Bld: 108 mg/dL — ABNORMAL HIGH (ref 70–99)
Potassium: 3.2 mmol/L — ABNORMAL LOW (ref 3.5–5.1)
Sodium: 137 mmol/L (ref 135–145)

## 2022-11-16 LAB — CBC
HCT: 40.5 % (ref 36.0–46.0)
Hemoglobin: 13.7 g/dL (ref 12.0–15.0)
MCH: 30.8 pg (ref 26.0–34.0)
MCHC: 33.8 g/dL (ref 30.0–36.0)
MCV: 91 fL (ref 80.0–100.0)
Platelets: 356 10*3/uL (ref 150–400)
RBC: 4.45 MIL/uL (ref 3.87–5.11)
RDW: 13.9 % (ref 11.5–15.5)
WBC: 10.1 10*3/uL (ref 4.0–10.5)
nRBC: 0 % (ref 0.0–0.2)

## 2022-11-16 LAB — HEMOGLOBIN A1C
Hgb A1c MFr Bld: 5.4 % (ref 4.8–5.6)
Mean Plasma Glucose: 108.28 mg/dL

## 2022-11-16 LAB — TYPE AND SCREEN
ABO/RH(D): A POS
Antibody Screen: NEGATIVE

## 2022-11-16 MED ORDER — FENTANYL CITRATE (PF) 100 MCG/2ML IJ SOLN
INTRAMUSCULAR | Status: AC | PRN
Start: 2022-11-16 — End: 2022-11-16
  Administered 2022-11-16: 50 ug via INTRAVENOUS

## 2022-11-16 MED ORDER — SODIUM CHLORIDE 0.9 % IV SOLN: INTRAVENOUS | Status: DC

## 2022-11-16 MED ORDER — FENTANYL CITRATE (PF) 100 MCG/2ML IJ SOLN
INTRAMUSCULAR | Status: AC
  Filled 2022-11-16: qty 2

## 2022-11-16 MED ORDER — SODIUM CHLORIDE 0.9 % IV SOLN
INTRAVENOUS | Status: AC | PRN
  Administered 2022-11-16: 2 g via INTRAVENOUS

## 2022-11-16 MED ORDER — SODIUM CHLORIDE 0.9 % IV SOLN
INTRAVENOUS | Status: AC
  Filled 2022-11-16: qty 20

## 2022-11-16 MED ORDER — MIDAZOLAM HCL 2 MG/2ML IJ SOLN
INTRAMUSCULAR | Status: AC
  Filled 2022-11-16: qty 2

## 2022-11-16 MED ORDER — SODIUM CHLORIDE 0.9 % IV SOLN
2.0000 g | Freq: Once | INTRAVENOUS | Status: DC
  Filled 2022-11-16: qty 20

## 2022-11-16 MED ORDER — MIDAZOLAM HCL 2 MG/2ML IJ SOLN
INTRAMUSCULAR | Status: AC | PRN
Start: 2022-11-16 — End: 2022-11-16
  Administered 2022-11-16: 1 mg via INTRAVENOUS

## 2022-11-16 MED ORDER — LIDOCAINE HCL 1 % IJ SOLN
INTRAMUSCULAR | Status: AC
  Filled 2022-11-16: qty 20

## 2022-11-16 MED ORDER — IOHEXOL 300 MG/ML  SOLN
50.0000 mL | Freq: Once | INTRAMUSCULAR | Status: AC | PRN
  Administered 2022-11-16: 15 mL

## 2022-11-16 MED ORDER — LIDOCAINE HCL 1 % IJ SOLN
10.0000 mL | Freq: Once | INTRAMUSCULAR | Status: AC
  Administered 2022-11-16: 10 mL via INTRADERMAL

## 2022-11-16 NOTE — OR Nursing (Signed)
Patient is stable for d/c. PAT nurse at bedside for preassesment screening for surgery for Monday, 11/19/22.

## 2022-11-16 NOTE — Progress Notes (Signed)
For Short Stay: COVID SWAB appointment date:  Bowel Prep reminder:   For Anesthesia: PCP - Norm Salt, PA  Cardiologist - Dr. Dietrich Pates. LOV: 01/11/14  Chest x-ray -  EKG - 11/16/22 Stress Test -  ECHO - 01/18/14 Cardiac Cath -  Pacemaker/ICD device last checked: Pacemaker orders received: Device Rep notified:  Spinal Cord Stimulator: N/A  Sleep Study - N/A  CPAP -   Fasting Blood Sugar - N/A Checks Blood Sugar ___0__ times a day Date and result of last Hgb A1c-  Last dose of GLP1 agonist- N/A GLP1 instructions:   Last dose of SGLT-2 inhibitors- N/A SGLT-2 instructions:   Blood Thinner Instructions: N/A Aspirin Instructions: Last Dose:  Activity level: Can go up a flight of stairs and activities of daily living without stopping and without chest pain and/or shortness of breath   Able to exercise without chest pain and/or shortness of breath  Anesthesia review: Hx: CHF,DIA,HTN,Mitral valve prolapse.  Patient denies shortness of breath, fever, cough and chest pain at PAT appointment   Patient verbalized understanding of instructions that were given to them at the PAT appointment. Patient was also instructed that they will need to review over the PAT instructions again at home before surgery.

## 2022-11-16 NOTE — Discharge Instructions (Signed)
Please call Interventional Radiology clinic [phone] with any questions or concerns.  You may remove your dressing and shower tomorrow.  After the procedure, it is common to have: Some soreness where the nephrostomy tube was inserted (tube insertion site) Blood-tinged drainage from the nephrostomy tube for the first 24 hours  Follow these instructions at home:  Medication: Do not use Aspirin or ibuprofen products, such as Advil or Motrin, as it may increase bleeding.  You may resume your usual medications as ordered by your doctor If your doctor prescribed antibiotics, take them as directed. Do not stop taking them just because you feel better. You need to take the full course of antibiotics.  Eating and drinking: Drink plenty of liquids to keep your urine pale yellow You can resume your regular diet as directed by your doctor  Care of the procedure site Follow instructions from your health care provider about how to take care of your tube insertion site. Make sure you: Wash your hands with soap and water for at least 20 seconds before you change your dressing. If soap and water are not available, use hand sanitizer. Change your dressing as told by your health care provider. Be careful not to pull on the tube while removing the dressing When you change the dressing, wash the skin around the tube, rinse well, and pat the skin dry Avoid using scissors to remove the dressing. Sharp objects may damage the catheter Check the tube insertion area every day for signs of infection. Check for: Redness, swelling, or pain Fluid or blood Warmth Pus or a bad smell Care of the nephrostomy tube and drainage bag Always keep the tubing, the leg bag, or the bedside drainage bags below the level of the kidney so that your urine drains freely When connecting your nephrostomy tube to a drainage bag, make sure that there are no kinks in the tubing and that your urine is draining freely. You may want to  use an elastic bandage to wrap any exposed tubing that goes from the nephrostomy tube to any of the connecting tubes. At night, you may want to connect your nephrostomy tube or the leg bag to a larger bedside drainage bag Follow instructions from your health care provider about how to empty or change the drainage bag Empty the drainage bag when it becomes ? full Replace the drainage bag and any extension tubing that is connected to your nephrostomy tube every 7 days or as told by your health care provider. Your health care provider will explain how to change the drainage bag and extension tubing. The nephrostomy tube will need to be changed every 8-12 weeks   Activity Do not lift anything that is heavier than 10 lb (4.5 kg), or the limit that you are told, until your health care provider says that it is safe Return to your normal activities as told by your health care provider.  Avoid activities that may cause the nephrostomy tubing to bend Do not take baths, swim, or use a hot tub until your health care provider approves. Ask your health care provider if you can take showers.  Cover the nephrostomy tube bandage (dressing) with a watertight covering when you take a shower Keep all follow-up visits as told by your doctor  Contact a health care provider if: You have problems with any of the valves or tubing You have persistent pain or soreness in your back You have redness, swelling, or pain around your tube insertion site You have fluid  or blood coming from your tube insertion site Your tube insertion site feels warm to the touch You have pus or a bad smell coming from your tube insertion site You have increased urine output or you feel burning when urinating  Get help right away if: You have pain in your abdomen during the first week You have chest pain or have trouble breathing You have a new appearance of blood in your urine You have a fever or chills You have back pain that is not  relieved by your medicine You have decreased urine output Your nephrostomy tube comes out  Moderate Conscious Sedation-Care After  This sheet gives you information about how to care for yourself after your procedure. Your health care provider may also give you more specific instructions. If you have problems or questions, contact your health care provider.  After the procedure, it is common to have: Sleepiness for several hours. Impaired judgment for several hours. Difficulty with balance. Vomiting if you eat too soon.  Follow these instructions at home:  Rest. Do not participate in activities where you could fall or become injured. Do not drive or use machinery. Do not drink alcohol. Do not take sleeping pills or medicines that cause drowsiness. Do not make important decisions or sign legal documents. Do not take care of children on your own.  Eating and drinking Follow the diet recommended by your health care provider. Drink enough fluid to keep your urine pale yellow. If you vomit: Drink water, juice, or soup when you can drink without vomiting. Make sure you have little or no nausea before eating solid foods.  General instructions Take over-the-counter and prescription medicines only as told by your health care provider. Have a responsible adult stay with you for the time you are told. It is important to have someone help care for you until you are awake and alert. Do not smoke. Keep all follow-up visits as told by your health care provider. This is important.  Contact a health care provider if: You are still sleepy or having trouble with balance after 24 hours. You feel light-headed. You keep feeling nauseous or you keep vomiting. You develop a rash. You have a fever. You have redness or swelling around the IV site.  Get help right away if: You have trouble breathing. You have new-onset confusion at home.  This information is not intended to replace advice given  to you by your health care provider. Make sure you discuss any questions you have with your healthcare provider.

## 2022-11-16 NOTE — Procedures (Signed)
Pre Procedure Dx: Ureteral stone Post Procedure Dx: Same  Successful fluoro guided conversion of left sided PCN to Nephroureteral catheter. Nephroureteral catheter connected to gravity bag.  EBL: None No immediate complications.   Katherina Right, MD Pager #: [phone]

## 2022-11-19 ENCOUNTER — Encounter (HOSPITAL_COMMUNITY)
Admission: RE | Disposition: A | Discharge status: Home / Self Care | Payer: Self-pay | Source: Ambulatory Visit | Attending: Urology

## 2022-11-19 ENCOUNTER — Observation Stay (HOSPITAL_COMMUNITY)
Admission: RE | Admit: 2022-11-19 | Discharge: 2022-11-20 | Disposition: A | Discharge status: Home / Self Care | Payer: Medicaid Other | Source: Ambulatory Visit | Attending: Urology | Admitting: Urology

## 2022-11-19 ENCOUNTER — Ambulatory Visit (HOSPITAL_COMMUNITY): Payer: Medicaid Other | Admitting: Physician Assistant

## 2022-11-19 ENCOUNTER — Ambulatory Visit (HOSPITAL_COMMUNITY): Payer: Medicaid Other

## 2022-11-19 ENCOUNTER — Other Ambulatory Visit: Payer: Self-pay

## 2022-11-19 ENCOUNTER — Ambulatory Visit (HOSPITAL_BASED_OUTPATIENT_CLINIC_OR_DEPARTMENT_OTHER): Payer: Self-pay | Admitting: Physician Assistant

## 2022-11-19 ENCOUNTER — Encounter (HOSPITAL_COMMUNITY): Payer: Self-pay | Admitting: Urology

## 2022-11-19 DIAGNOSIS — N201 Calculus of ureter: Principal | ICD-10-CM | POA: Diagnosis present

## 2022-11-19 DIAGNOSIS — I509 Heart failure, unspecified: Secondary | ICD-10-CM | POA: Insufficient documentation

## 2022-11-19 DIAGNOSIS — N200 Calculus of kidney: Secondary | ICD-10-CM | POA: Diagnosis not present

## 2022-11-19 DIAGNOSIS — I110 Hypertensive heart disease with heart failure: Secondary | ICD-10-CM

## 2022-11-19 DIAGNOSIS — I10 Essential (primary) hypertension: Secondary | ICD-10-CM | POA: Diagnosis not present

## 2022-11-19 DIAGNOSIS — Z01818 Encounter for other preprocedural examination: Secondary | ICD-10-CM

## 2022-11-19 DIAGNOSIS — E119 Type 2 diabetes mellitus without complications: Secondary | ICD-10-CM | POA: Diagnosis not present

## 2022-11-19 DIAGNOSIS — I5021 Acute systolic (congestive) heart failure: Secondary | ICD-10-CM | POA: Diagnosis not present

## 2022-11-19 DIAGNOSIS — Z79899 Other long term (current) drug therapy: Secondary | ICD-10-CM | POA: Insufficient documentation

## 2022-11-19 DIAGNOSIS — Z87891 Personal history of nicotine dependence: Secondary | ICD-10-CM

## 2022-11-19 HISTORY — PX: NEPHROLITHOTOMY: SHX5134

## 2022-11-19 LAB — BASIC METABOLIC PANEL
Anion gap: 12 (ref 5–15)
BUN: 9 mg/dL (ref 6–20)
CO2: 24 mmol/L (ref 22–32)
Calcium: 8.9 mg/dL (ref 8.9–10.3)
Chloride: 101 mmol/L (ref 98–111)
Creatinine, Ser: 0.57 mg/dL (ref 0.44–1.00)
GFR, Estimated: >60 mL/min (ref >60)
Glucose, Bld: 134 mg/dL — ABNORMAL HIGH (ref 70–99)
Potassium: 3.5 mmol/L (ref 3.5–5.1)
Sodium: 137 mmol/L (ref 135–145)

## 2022-11-19 LAB — GLUCOSE, CAPILLARY
Glucose-Capillary: 125 mg/dL — ABNORMAL HIGH (ref 70–99)
Glucose-Capillary: 175 mg/dL — ABNORMAL HIGH (ref 70–99)

## 2022-11-19 LAB — POCT PREGNANCY, URINE: Preg Test, Ur: NEGATIVE

## 2022-11-19 LAB — HEMOGLOBIN AND HEMATOCRIT, BLOOD
HCT: 41.3 % (ref 36.0–46.0)
Hemoglobin: 13.9 g/dL (ref 12.0–15.0)

## 2022-11-19 SURGERY — NEPHROLITHOTOMY PERCUTANEOUS
Anesthesia: General | Laterality: Left

## 2022-11-19 MED ORDER — TAMSULOSIN HCL 0.4 MG PO CAPS: 0.4000 mg | ORAL_CAPSULE | Freq: Every day | ORAL | 0 refills | Status: AC

## 2022-11-19 MED ORDER — ORAL CARE MOUTH RINSE: 15.0000 mL | Freq: Once | OROMUCOSAL | Status: AC

## 2022-11-19 MED ORDER — TRIPLE ANTIBIOTIC 3.5-400-5000 EX OINT: 1.0000 | TOPICAL_OINTMENT | Freq: Three times a day (TID) | CUTANEOUS | Status: DC | PRN

## 2022-11-19 MED ORDER — DIPHENHYDRAMINE HCL 12.5 MG/5ML PO ELIX: 12.5000 mg | ORAL_SOLUTION | Freq: Four times a day (QID) | ORAL | Status: DC | PRN

## 2022-11-19 MED ORDER — LIDOCAINE HCL (PF) 2 % IJ SOLN
INTRAMUSCULAR | Status: AC
  Filled 2022-11-19: qty 5

## 2022-11-19 MED ORDER — DEXAMETHASONE SODIUM PHOSPHATE 10 MG/ML IJ SOLN
INTRAMUSCULAR | Status: DC | PRN
Start: 2022-11-19 — End: 2022-11-19
  Administered 2022-11-19: 10 mg via INTRAVENOUS

## 2022-11-19 MED ORDER — ONDANSETRON HCL 4 MG/2ML IJ SOLN
INTRAMUSCULAR | Status: AC
  Filled 2022-11-19: qty 2

## 2022-11-19 MED ORDER — MORPHINE SULFATE (PF) 2 MG/ML IV SOLN
2.0000 mg | INTRAVENOUS | Status: DC | PRN
  Administered 2022-11-19 (×2): 4 mg via INTRAVENOUS
  Filled 2022-11-19 (×2): qty 2

## 2022-11-19 MED ORDER — SUGAMMADEX SODIUM 200 MG/2ML IV SOLN
INTRAVENOUS | Status: DC | PRN
  Administered 2022-11-19: 400 mg via INTRAVENOUS

## 2022-11-19 MED ORDER — SENNA 8.6 MG PO TABS
1.0000 | ORAL_TABLET | Freq: Two times a day (BID) | ORAL | Status: DC
  Administered 2022-11-19 – 2022-11-20 (×3): 8.6 mg via ORAL
  Filled 2022-11-19 (×3): qty 1

## 2022-11-19 MED ORDER — ONDANSETRON HCL 4 MG/2ML IJ SOLN: 4.0000 mg | INTRAMUSCULAR | Status: DC | PRN

## 2022-11-19 MED ORDER — LIDOCAINE HCL (CARDIAC) PF 100 MG/5ML IV SOSY
PREFILLED_SYRINGE | INTRAVENOUS | Status: DC | PRN
Start: 2022-11-19 — End: 2022-11-19
  Administered 2022-11-19: 60 mg via INTRATRACHEAL

## 2022-11-19 MED ORDER — OXYCODONE HCL 5 MG PO TABS
5.0000 mg | ORAL_TABLET | ORAL | Status: DC | PRN
  Administered 2022-11-19 – 2022-11-20 (×4): 5 mg via ORAL
  Filled 2022-11-19 (×4): qty 1

## 2022-11-19 MED ORDER — SODIUM CHLORIDE 0.9 % IR SOLN
Status: DC | PRN
  Administered 2022-11-19: 6000 mL via INTRAVESICAL

## 2022-11-19 MED ORDER — MIDAZOLAM HCL 2 MG/2ML IJ SOLN
INTRAMUSCULAR | Status: AC
  Filled 2022-11-19: qty 2

## 2022-11-19 MED ORDER — OXYCODONE HCL 5 MG/5ML PO SOLN: 5.0000 mg | Freq: Once | ORAL | Status: DC | PRN

## 2022-11-19 MED ORDER — DIPHENHYDRAMINE HCL 50 MG/ML IJ SOLN: 12.5000 mg | Freq: Four times a day (QID) | INTRAMUSCULAR | Status: DC | PRN

## 2022-11-19 MED ORDER — SODIUM CHLORIDE 0.9 % IV SOLN: INTRAVENOUS | Status: DC

## 2022-11-19 MED ORDER — ONDANSETRON HCL 4 MG/2ML IJ SOLN: 4.0000 mg | Freq: Once | INTRAMUSCULAR | Status: DC | PRN

## 2022-11-19 MED ORDER — OXYCODONE HCL 5 MG PO TABS: 5.0000 mg | ORAL_TABLET | Freq: Once | ORAL | Status: DC | PRN

## 2022-11-19 MED ORDER — DEXAMETHASONE SODIUM PHOSPHATE 10 MG/ML IJ SOLN
INTRAMUSCULAR | Status: AC
  Filled 2022-11-19: qty 1

## 2022-11-19 MED ORDER — MEPERIDINE HCL 50 MG/ML IJ SOLN: 6.2500 mg | INTRAMUSCULAR | Status: DC | PRN

## 2022-11-19 MED ORDER — FENTANYL CITRATE (PF) 100 MCG/2ML IJ SOLN
INTRAMUSCULAR | Status: AC
  Filled 2022-11-19: qty 2

## 2022-11-19 MED ORDER — CEFAZOLIN SODIUM-DEXTROSE 2-4 GM/100ML-% IV SOLN
2.0000 g | INTRAVENOUS | Status: AC
  Administered 2022-11-19: 2 g via INTRAVENOUS
  Filled 2022-11-19: qty 100

## 2022-11-19 MED ORDER — OXYBUTYNIN CHLORIDE 5 MG PO TABS: 5.0000 mg | ORAL_TABLET | Freq: Three times a day (TID) | ORAL | Status: DC | PRN

## 2022-11-19 MED ORDER — HYDROCODONE-ACETAMINOPHEN 5-325 MG PO TABS: 1.0000 | ORAL_TABLET | ORAL | 0 refills | Status: AC | PRN

## 2022-11-19 MED ORDER — PROPOFOL 10 MG/ML IV BOLUS
INTRAVENOUS | Status: DC | PRN
Start: 2022-11-19 — End: 2022-11-19
  Administered 2022-11-19: 200 mg via INTRAVENOUS

## 2022-11-19 MED ORDER — AMISULPRIDE (ANTIEMETIC) 5 MG/2ML IV SOLN: 10.0000 mg | Freq: Once | INTRAVENOUS | Status: DC | PRN

## 2022-11-19 MED ORDER — MIDAZOLAM HCL 5 MG/5ML IJ SOLN
INTRAMUSCULAR | Status: DC | PRN
  Administered 2022-11-19: 2 mg via INTRAVENOUS

## 2022-11-19 MED ORDER — KETOROLAC TROMETHAMINE 30 MG/ML IJ SOLN: 30.0000 mg | Freq: Once | INTRAMUSCULAR | Status: DC | PRN

## 2022-11-19 MED ORDER — HYDROMORPHONE HCL 1 MG/ML IJ SOLN
INTRAMUSCULAR | Status: AC
  Filled 2022-11-19: qty 1

## 2022-11-19 MED ORDER — 0.9 % SODIUM CHLORIDE (POUR BTL) OPTIME
TOPICAL | Status: DC | PRN
  Administered 2022-11-19: 1000 mL

## 2022-11-19 MED ORDER — ACETAMINOPHEN 500 MG PO TABS
1000.0000 mg | ORAL_TABLET | Freq: Once | ORAL | Status: AC
  Administered 2022-11-19: 1000 mg via ORAL
  Filled 2022-11-19: qty 2

## 2022-11-19 MED ORDER — ROCURONIUM BROMIDE 100 MG/10ML IV SOLN
INTRAVENOUS | Status: DC | PRN
Start: 2022-11-19 — End: 2022-11-19
  Administered 2022-11-19: 100 mg via INTRAVENOUS

## 2022-11-19 MED ORDER — FENTANYL CITRATE (PF) 100 MCG/2ML IJ SOLN
INTRAMUSCULAR | Status: DC | PRN
  Administered 2022-11-19 (×2): 50 ug via INTRAVENOUS

## 2022-11-19 MED ORDER — IOHEXOL 300 MG/ML  SOLN
INTRAMUSCULAR | Status: DC | PRN
  Administered 2022-11-19: 10 mL

## 2022-11-19 MED ORDER — HYDROMORPHONE HCL 1 MG/ML IJ SOLN
0.2500 mg | INTRAMUSCULAR | Status: DC | PRN
  Administered 2022-11-19 (×2): 0.5 mg via INTRAVENOUS

## 2022-11-19 MED ORDER — LACTATED RINGERS IV SOLN: INTRAVENOUS | Status: DC

## 2022-11-19 MED ORDER — ZOLPIDEM TARTRATE 5 MG PO TABS
5.0000 mg | ORAL_TABLET | Freq: Every evening | ORAL | Status: DC | PRN
  Filled 2022-11-19: qty 1

## 2022-11-19 MED ORDER — CHLORHEXIDINE GLUCONATE 0.12 % MT SOLN
15.0000 mL | Freq: Once | OROMUCOSAL | Status: AC
  Administered 2022-11-19: 15 mL via OROMUCOSAL

## 2022-11-19 MED ORDER — ACETAMINOPHEN 325 MG PO TABS
650.0000 mg | ORAL_TABLET | ORAL | Status: DC | PRN
  Administered 2022-11-19 – 2022-11-20 (×3): 650 mg via ORAL
  Filled 2022-11-19 (×3): qty 2

## 2022-11-19 MED ORDER — DOCUSATE SODIUM 100 MG PO CAPS
100.0000 mg | ORAL_CAPSULE | Freq: Two times a day (BID) | ORAL | Status: DC
  Administered 2022-11-19 – 2022-11-20 (×3): 100 mg via ORAL
  Filled 2022-11-19 (×3): qty 1

## 2022-11-19 SURGICAL SUPPLY — 51 items
APL PRP STRL LF DISP 70% ISPRP (MISCELLANEOUS) ×1
APL SKNCLS STERI-STRIP NONHPOA (GAUZE/BANDAGES/DRESSINGS) ×1
BAG COUNTER SPONGE SURGICOUNT (BAG) IMPLANT
BAG DRN RND TRDRP ANRFLXCHMBR (UROLOGICAL SUPPLIES)
BAG SPNG CNTER NS LX DISP (BAG)
BAG URINE DRAIN 2000ML AR STRL (UROLOGICAL SUPPLIES) IMPLANT
BASKET ZERO TIP NITINOL 2.4FR (BASKET) IMPLANT
BENZOIN TINCTURE PRP APPL 2/3 (GAUZE/BANDAGES/DRESSINGS) ×1 IMPLANT
BLADE SURG 15 STRL LF DISP TIS (BLADE) ×1 IMPLANT
BLADE SURG 15 STRL SS (BLADE) ×1
BSKT STON RTRVL ZERO TP 2.4FR (BASKET)
CATH FOLEY 2W COUNCIL 20FR 5CC (CATHETERS) IMPLANT
CATH ROBINSON RED A/P 20FR (CATHETERS) IMPLANT
CATH URETERAL DUAL LUMEN 10F (MISCELLANEOUS) ×1 IMPLANT
CATH X-FORCE N30 NEPHROSTOMY (TUBING) ×1 IMPLANT
CHLORAPREP W/TINT 26 (MISCELLANEOUS) ×1 IMPLANT
COVER BACK TABLE 60X90IN (DRAPES) ×1 IMPLANT
COVER SURGICAL LIGHT HANDLE (MISCELLANEOUS) IMPLANT
DRAPE C-ARM 42X120 X-RAY (DRAPES) ×1 IMPLANT
DRAPE LINGEMAN PERC (DRAPES) ×1 IMPLANT
DRAPE SURG IRRIG POUCH 19X23 (DRAPES) ×1 IMPLANT
DRSG TEGADERM 8X12 (GAUZE/BANDAGES/DRESSINGS) IMPLANT
GAUZE PAD ABD 8X10 STRL (GAUZE/BANDAGES/DRESSINGS) ×2 IMPLANT
GAUZE SPONGE 4X4 12PLY STRL (GAUZE/BANDAGES/DRESSINGS) IMPLANT
GLOVE BIO SURGEON STRL SZ7.5 (GLOVE) ×1 IMPLANT
GOWN STRL REUS W/ TWL XL LVL3 (GOWN DISPOSABLE) ×1 IMPLANT
GOWN STRL REUS W/TWL XL LVL3 (GOWN DISPOSABLE) ×1
GUIDEWIRE AMPLAZ .035X145 (WIRE) ×2 IMPLANT
KIT BASIN OR (CUSTOM PROCEDURE TRAY) ×1 IMPLANT
KIT PROBE TRILOGY 3.9X350 (MISCELLANEOUS) IMPLANT
KIT TURNOVER KIT A (KITS) IMPLANT
LASER FIB FLEXIVA PULSE ID 365 (Laser) IMPLANT
LUBRICANT JELLY K Y 4OZ (MISCELLANEOUS) ×1 IMPLANT
MANIFOLD NEPTUNE II (INSTRUMENTS) ×1 IMPLANT
NS IRRIG 1000ML POUR BTL (IV SOLUTION) ×1 IMPLANT
PACK CYSTO (CUSTOM PROCEDURE TRAY) IMPLANT
SPONGE T-LAP 4X18 ~~LOC~~+RFID (SPONGE) ×1 IMPLANT
STENT ENDOURETEROTOMY 7-14 26C (STENTS) IMPLANT
STENT URET 6FRX26 CONTOUR (STENTS) IMPLANT
SUT CHROMIC 3 0 SH 27 (SUTURE) IMPLANT
SUT SILK 2 0 30 PSL (SUTURE) IMPLANT
SYR 10ML LL (SYRINGE) ×1 IMPLANT
SYR 20ML LL LF (SYRINGE) ×1 IMPLANT
TOWEL OR 17X26 10 PK STRL BLUE (TOWEL DISPOSABLE) ×1 IMPLANT
TRACTIP FLEXIVA PULS ID 200XHI (Laser) IMPLANT
TRACTIP FLEXIVA PULSE ID 200 (Laser)
TRAY FOLEY MTR SLVR 16FR STAT (SET/KITS/TRAYS/PACK) ×1 IMPLANT
TUBING CONNECTING 10 (TUBING) ×1 IMPLANT
TUBING STONE CATCHER TRILOGY (MISCELLANEOUS) IMPLANT
TUBING UROLOGY SET (TUBING) ×1 IMPLANT
WATER STERILE IRR 1000ML POUR (IV SOLUTION) ×1 IMPLANT

## 2022-11-19 NOTE — H&P (Signed)
H&P  Chief Complaint: Left ureteral calculus  History of Present Illness: 37 year old female status post left nephrostomy tube placement for an obstructing 1.8 cm left renal pelvic calculus.  She underwent conversion to a nephroureteral stent and presents for PCNL today.  Past Medical History:  Diagnosis Date   Abnormal Pap smear    Anxiety    on meds   CHF (congestive heart failure) (HCC)    DM2 (diabetes mellitus, type 2) (HCC)    patient denies   Gestational diabetes    Healthcare-associated pneumonia 10/31/2012   Heart murmur    Herpes genitalia    HSV (herpes simplex virus) infection    last outbreak in may   Hyperlipidemia    Hypertension    Infection    UTI   Mitral valve prolapse    Obesity    Status post repair of cor triatriatum    Echocardiogram 12/05: EF 55-65%, no mitral valve prolapse, mild RVE and no mention of any residual ASD.     Past Surgical History:  Procedure Laterality Date   CARDIAC SURGERY     congenital heart defect repair at 39 mos   CESAREAN SECTION N/A 10/06/2012   Procedure: Primary CESAREAN SECTION  of baby girl at 0347 APGAR 8/8;  Surgeon: Philip Aspen, DO;  Location: [facility];  Service: Obstetrics;  Laterality: N/A;   EXPLORATION POST OPERATIVE OPEN HEART     IR CONVERT LEFT NEPHROSTOMY TO NEPHROURETERAL CATH  11/16/2022   IR NEPHROSTOMY PLACEMENT LEFT  10/06/2022   LAPAROTOMY N/A 10/07/2012   Procedure: EXPLORATORY LAPAROTOMY;  Surgeon: Freddrick March. Tenny Craw, MD;  Location: [facility];  Service: Gynecology;  Laterality: N/A;    Home Medications:  Medications Prior to Admission  Medication Sig Dispense Refill Last Dose   acetaminophen (TYLENOL) 325 MG tablet Take 2 tablets (650 mg total) by mouth every 6 (six) hours as needed for mild pain.   11/18/2022   levonorgestrel (MIRENA) 20 MCG/24HR IUD 1 each by Intrauterine route once.      Multiple Vitamin (MULTIVITAMIN WITH MINERALS) TABS tablet Take 1 tablet by mouth in the morning.   Past Week   Allergies:   Allergies  Allergen Reactions   Mucinex [Guaifenesin Er] Shortness Of Breath    Family History  Problem Relation Age of Onset   Diabetes Mother    Asthma Mother    Diabetes Father    Asthma Father    Diabetes Maternal Grandmother    Diabetes Paternal Grandfather    Social History:  reports that she has quit smoking. Her smoking use included cigarettes. She has been exposed to tobacco smoke. She has never used smokeless tobacco. She reports current alcohol use. She reports that she does not use drugs.  ROS: A complete review of systems was performed.  All systems are negative except for pertinent findings as noted. ROS   Physical Exam:  Vital signs in last 24 hours: Temp:  [98.2 F (36.8 C)] 98.2 F (36.8 C) (09/09 0600) Pulse Rate:  [70] 70 (09/09 0600) Resp:  [16] 16 (09/09 0600) BP: (143)/(95) 143/95 (09/09 0600) SpO2:  [97 %] 97 % (09/09 0600) Weight:  [83.9 kg] 83.9 kg (09/09 0538) General:  Alert and oriented, No acute distress HEENT: Normocephalic, atraumatic Neck: No JVD or lymphadenopathy Cardiovascular: Regular rate and rhythm Lungs: Regular rate and effort Abdomen: Soft, nontender, nondistended, no abdominal masses Back: No CVA tenderness Extremities: No edema Neurologic: Grossly intact  Laboratory Data:  Results for orders placed or performed  during the hospital encounter of 11/19/22 (from the past 24 hour(s))  Pregnancy, urine POC     Status: None   Collection Time: 11/19/22  6:02 AM  Result Value Ref Range   Preg Test, Ur NEGATIVE NEGATIVE   No results found for this or any previous visit (from the past 240 hour(s)). Creatinine: Recent Labs    11/16/22 1100  CREATININE 0.53    Impression/Assessment:  Left ureteral stone  Plan:  Proceed with left PCNL  Ray Church, III 11/19/2022, 7:32 AM

## 2022-11-19 NOTE — Op Note (Signed)
Operative Note  Preoperative diagnosis:  1.  Left renal calculus  Postoperative diagnosis: 1.  Left renal calculus   Procedure(s): 1.  Left percutaneous nephrolithotomy  Surgeon: Modena Slater, MD  Assistants: None  Anesthesia: General  Complications: None immediate  EBL: 50 cc  Specimens: 1.  Renal calculus  Drains/Catheters: 1.  6 x 26 double-J ureteral stent  Intraoperative findings: Large left proximal ureteral calculus.  All stone removed.  Indication: 37 year old female with a large left renal/ureteral calculus presents for the previously mentioned operation.  Description of procedure:  The patient was identified and consent was obtained.  The patient was taken to the operating room and placed in the supine position.  The patient was placed under general anesthesia.  Perioperative antibiotics were administered.  The patient was placed in prone position and all pressure points were padded.  Patient was prepped and draped in a standard sterile fashion and a timeout was performed.  A Super Stiff wire was advanced through the nephroureteral stent down to the bladder under fluoroscopic guidance and the nephroureteral stent was removed.  A dual-lumen ureteral catheter was advanced over the Super Stiff wire into the renal pelvis and an antegrade nephrostogram was performed.  This showed a well opacified kidney and a filling defect corresponding to the stone of interest.  I advanced the dual-lumen ureteral catheter into the proximal ureter under fluoroscopic guidance followed by placement of a second Super Stiff wire down to the bladder under fluoroscopic guidance.  The dual-lumen catheter was removed.  An incision was made alongside the wires.  The balloon dilator was then advanced over one of the wires and into the renal pelvis fluoroscopic guidance and the tract was dilated to a pressure of 18.  The sheath was advanced over the balloon and into the renal pelvis.  The balloon was  withdrawn keeping the sheath in place.  The nephroscope was advanced into the kidney and the stone of interest was encountered.  The stone was then removed with a grasper.  All stone was removed and there was no evidence of any other stones within the kidney.  A digital ureteroscope was advanced down the ureter and there were no other calculi seen.  I withdrew the ureteroscope.    A 6 x 26 double-J ureteral stent was advanced over 1 of the wires under fluoroscopic guidance and the wire was withdrawn.  Fluoroscopy confirmed a good coil within the bladder as well as a good coil in the renal pelvis proximally.  I withdrew the other wire.  I then removed the sheath.  I closed the incision with a 3-0 Monocryl.  This concluded the operation.  The patient tolerated procedure well and was stable postoperatively.  Plan: Patient will remain under observation overnight. Stent to be removed in 1 week.

## 2022-11-19 NOTE — Anesthesia Preprocedure Evaluation (Addendum)
Anesthesia Evaluation  Patient identified by MRN, date of birth, ID band Patient awake    Reviewed: Allergy & Precautions, NPO status , Patient's Chart, lab work & pertinent test results  Airway Mallampati: III  TM Distance: >3 FB Neck ROM: Full    Dental  (+) Chipped, Dental Advisory Given,    Pulmonary former smoker   Pulmonary exam normal breath sounds clear to auscultation       Cardiovascular hypertension (143/95 preop no home meds), Normal cardiovascular exam Rhythm:Regular Rate:Normal  Echo 2015 - Left ventricle: The cavity size was normal. Wall thickness was    normal. Systolic function was normal. The estimated ejection    fraction was in the range of 55% to 60%. Akinesis of the basal to    mid septum. The study is not technically sufficient to allow    evaluation of LV diastolic function.  - Mitral valve: Mildly thickened leaflets . There was trivial    regurgitation.  - Left atrium: The atrium was normal in size.  - Right ventricle: The cavity size was mildly dilated. Wall    thickness was normal. Systolic function is reduced. Lateral    annulus peak S velocity: 5.02 cm/s.  - Right atrium: The atrium was mildly dilated.  - Atrial septum: Poorly visualized, reportedly s/p repair. No    obvious ASD by color doppler.     Neuro/Psych  PSYCHIATRIC DISORDERS Anxiety     negative neurological ROS     GI/Hepatic negative GI ROS, Neg liver ROS,,,  Endo/Other  diabetes  Obesity BMI 37  Renal/GU negative Renal ROS  negative genitourinary   Musculoskeletal negative musculoskeletal ROS (+)    Abdominal  (+) + obese  Peds  Hematology negative hematology ROS (+) Hb 13.7   Anesthesia Other Findings   Reproductive/Obstetrics negative OB ROS Urine preg neg this AM                             Anesthesia Physical Anesthesia Plan  ASA: 3  Anesthesia Plan: General   Post-op Pain  Management: Tylenol PO (pre-op)*   Induction: Intravenous  PONV Risk Score and Plan: 3 and Ondansetron, Dexamethasone, Midazolam and Treatment may vary due to age or medical condition  Airway Management Planned: Oral ETT  Additional Equipment: None  Intra-op Plan:   Post-operative Plan: Extubation in OR  Informed Consent: I have reviewed the patients History and Physical, chart, labs and discussed the procedure including the risks, benefits and alternatives for the proposed anesthesia with the patient or authorized representative who has indicated his/her understanding and acceptance.     Dental advisory given  Plan Discussed with: CRNA  Anesthesia Plan Comments:        Anesthesia Quick Evaluation

## 2022-11-19 NOTE — Transfer of Care (Signed)
Immediate Anesthesia Transfer of Care Note  Patient: [name]  Procedure(s) Performed: LEFT NEPHROLITHOTOMY PERCUTANEOUS (Left)  Patient Location: PACU  Anesthesia Type:General  Level of Consciousness: awake, alert , oriented, and patient cooperative  Airway & Oxygen Therapy: Patient connected to face mask oxygen  Post-op Assessment: Report given to RN and Post -op Vital signs reviewed and stable  Post vital signs: stable  Last Vitals:  Vitals Value Taken Time  BP 162/97 11/19/22 0916  Temp    Pulse 75 11/19/22 0917  Resp    SpO2 99 % 11/19/22 0917  Vitals shown include unfiled device data.  Last Pain:  Vitals:   11/19/22 0600  TempSrc: Oral         Complications: No notable events documented.

## 2022-11-19 NOTE — Plan of Care (Signed)
  Problem: Clinical Measurements: Goal: Will remain free from infection Outcome: Progressing   Problem: Elimination: Goal: Will not experience complications related to urinary retention Outcome: Progressing   Problem: Pain Managment: Goal: General experience of comfort will improve Outcome: Progressing   Problem: Education: Goal: Knowledge of General Education information will improve Description: Including pain rating scale, medication(s)/side effects and non-pharmacologic comfort measures Outcome: Adequate for Discharge   Problem: Health Behavior/Discharge Planning: Goal: Ability to manage health-related needs will improve Outcome: Adequate for Discharge   Problem: Clinical Measurements: Goal: Ability to maintain clinical measurements within normal limits will improve Outcome: Adequate for Discharge Goal: Diagnostic test results will improve Outcome: Adequate for Discharge Goal: Respiratory complications will improve Outcome: Adequate for Discharge Goal: Cardiovascular complication will be avoided Outcome: Adequate for Discharge   Problem: Activity: Goal: Risk for activity intolerance will decrease Outcome: Adequate for Discharge   Problem: Nutrition: Goal: Adequate nutrition will be maintained Outcome: Adequate for Discharge   Problem: Elimination: Goal: Will not experience complications related to bowel motility Outcome: Adequate for Discharge   Problem: Safety: Goal: Ability to remain free from injury will improve Outcome: Adequate for Discharge   Problem: Skin Integrity: Goal: Risk for impaired skin integrity will decrease Outcome: Adequate for Discharge

## 2022-11-19 NOTE — Anesthesia Procedure Notes (Signed)
Procedure Name: Intubation Date/Time: 11/19/2022 7:43 AM  Performed by: Donna Bernard, CRNAPre-anesthesia Checklist: Patient identified, Emergency Drugs available, Suction available, Patient being monitored and Timeout performed Patient Re-evaluated:Patient Re-evaluated prior to induction Oxygen Delivery Method: Circle system utilized Preoxygenation: Pre-oxygenation with 100% oxygen Induction Type: IV induction Ventilation: Mask ventilation without difficulty Laryngoscope Size: Miller and 2 Grade View: Grade II Tube type: Oral Tube size: 7.0 mm Number of attempts: 1 Airway Equipment and Method: Stylet Placement Confirmation: positive ETCO2, ETT inserted through vocal cords under direct vision, CO2 detector and breath sounds checked- equal and bilateral Secured at: 22 cm Tube secured with: Tape Dental Injury: Teeth and Oropharynx as per pre-operative assessment

## 2022-11-19 NOTE — Discharge Instructions (Signed)
Discharge instructions following PCNL ° °Call your doctor for: °Fevers greater than 100.5 °Severe nausea or vomiting °Increasing pain not controlled by pain medication °Increasing redness or drainage from incisions °Decreased urine output or a catheter is no longer draining ° °The number for questions is [phone]. ° °Activity: °Gradually increase activity with short frequent walks, 3-4 times a day.  Avoid strenuous activities, like sports, lawn-mowing, or heavy lifting (more than 10-15 pounds).  Wear loose, comfortable clothing that pull or kink the tube or tubes.  Do not drive while taking pain medication, or until your doctor permitts it. ° °Bathing and dressing changes: °You should not shower for 48 hours after surgery.  Do not soak your back in a bathtub. ° °Drainage bag care: °You may be discharged with a drainage bag around the site of your surgery.  The drainage bag should be secured such that it never pulls or loosens to prevent it from leaking.  It is important to wash her hands before and after emptying the drainage bag to help prevent the spread of infection.  The drainage bag should be emptied as needed.  When the wound stops draining or it is manageable with a dry gauze dressing, you can remove the bag. ° °If your tube in the back was removed, you should expect to have some leakage of fluid from the back incision.  This should slowly decrease and stop over the next couple of days.  If you have severe pain or persistent leakage, please call the number above.  Otherwise, your dressing can be changed 1-2 times daily or more if needed. ° °Diet: °It is extremely important to drink plenty of fluids after surgery, especially water.  You may resume your regular diet, unless otherwise instructed. ° °Medications: °May take Tylenol (acetaminophen) or ibuprofen (Advil, Motrin) as directed over-the-counter. °Take any prescriptions as directed. ° °Follow-up appointments: °Follow-up appointment will be scheduled  with Dr. Bell °

## 2022-11-19 NOTE — Anesthesia Postprocedure Evaluation (Signed)
Anesthesia Post Note  Patient: [name]  Procedure(s) Performed: LEFT NEPHROLITHOTOMY PERCUTANEOUS (Left)     Patient location during evaluation: PACU Anesthesia Type: General Level of consciousness: awake and alert, oriented and patient cooperative Pain management: pain level controlled Vital Signs Assessment: post-procedure vital signs reviewed and stable Respiratory status: spontaneous breathing, nonlabored ventilation and respiratory function stable Cardiovascular status: blood pressure returned to baseline and stable Postop Assessment: no apparent nausea or vomiting Anesthetic complications: no Comments: D/w pt needs to re-establish care with PCP for antihypertensives   No notable events documented.  Last Vitals:  Vitals:   11/19/22 1000 11/19/22 1036  BP: (!) 172/102 (!) 165/91  Pulse: 67 79  Resp: 12 19  Temp: (!) 36.4 C 36.6 C  SpO2: 99% 97%    Last Pain:  Vitals:   11/19/22 1036  TempSrc: Oral  PainSc:                  Lannie Fields

## 2022-11-20 ENCOUNTER — Encounter (HOSPITAL_COMMUNITY): Payer: Self-pay | Admitting: Urology

## 2022-11-20 DIAGNOSIS — N200 Calculus of kidney: Secondary | ICD-10-CM | POA: Diagnosis not present

## 2022-11-20 LAB — HEMOGLOBIN AND HEMATOCRIT, BLOOD
HCT: 34 % — ABNORMAL LOW (ref 36.0–46.0)
Hemoglobin: 11.2 g/dL — ABNORMAL LOW (ref 12.0–15.0)

## 2022-11-20 LAB — BASIC METABOLIC PANEL
Anion gap: 11 (ref 5–15)
BUN: 12 mg/dL (ref 6–20)
CO2: 21 mmol/L — ABNORMAL LOW (ref 22–32)
Calcium: 8.5 mg/dL — ABNORMAL LOW (ref 8.9–10.3)
Chloride: 104 mmol/L (ref 98–111)
Creatinine, Ser: 0.46 mg/dL (ref 0.44–1.00)
GFR, Estimated: >60 mL/min (ref >60)
Glucose, Bld: 108 mg/dL — ABNORMAL HIGH (ref 70–99)
Potassium: 4.6 mmol/L (ref 3.5–5.1)
Sodium: 136 mmol/L (ref 135–145)

## 2022-11-20 NOTE — TOC Transition Note (Signed)
Transition of Care [facility]) - CM/SW Discharge Note   Patient Details  Name: [name] MRN: [id number] Date of Birth: [birth year]  Transition of Care [facility]) CM/SW Contact:  Lanier Clam, RN Phone Number: 11/20/2022, 9:34 AM   Clinical Narrative: d/c home no needs or orders.      Final next level of care: Home/Self Care Barriers to Discharge: No Barriers Identified   Patient Goals and CMS Choice      Discharge Placement                         Discharge Plan and Services Additional resources added to the After Visit Summary for                                       Social Determinants of Health (SDOH) Interventions SDOH Screenings   Food Insecurity: No Food Insecurity (11/19/2022)  Housing: Low Risk  (11/19/2022)  Transportation Needs: No Transportation Needs (11/19/2022)  Utilities: Not At Risk (11/19/2022)  Tobacco Use: Medium Risk (11/19/2022)     Readmission Risk Interventions     No data to display

## 2022-11-20 NOTE — Plan of Care (Signed)

## 2022-11-20 NOTE — Plan of Care (Signed)

## 2022-11-23 NOTE — Discharge Summary (Signed)
Physician Discharge Summary  Patient ID: [name] MRN: [id number] DOB/AGE: 1985/12/11 37 y.o.  Admit date: 11/19/2022 Discharge date: 11/20/2022  Admission Diagnoses:  Discharge Diagnoses:  Principal Problem:   Ureteral calculi   Discharged Condition: good  Hospital Course: Patient underwent a left PCNL.  She tolerated the procedure well and was stable postoperative.  Remained overnight under observation and the following day was stable for discharge.  Consults: None  Significant Diagnostic Studies: None  Treatments: surgery: Left PCNL  Discharge Exam: Blood pressure 120/70, pulse 75, temperature 98.5 F (36.9 C), temperature source Oral, resp. rate 19, height 4\' 11"  (1.499 m), weight 83.9 kg, last menstrual period 11/13/2022, SpO2 98%. General appearance: alert no acute distress Abdomen soft, nontender, nondistended Nonlabored respiration   Disposition: Discharge disposition: 01-Home or Self Care        Allergies as of 11/20/2022       Reactions   Mucinex [guaifenesin Er] Shortness Of Breath        Medication List     TAKE these medications    acetaminophen 325 MG tablet Commonly known as: TYLENOL Take 2 tablets (650 mg total) by mouth every 6 (six) hours as needed for mild pain.   HYDROcodone-acetaminophen 5-325 MG tablet Commonly known as: Norco Take 1 tablet by mouth every 4 (four) hours as needed for moderate pain.   levonorgestrel 20 MCG/24HR IUD Commonly known as: MIRENA 1 each by Intrauterine route once.   multivitamin with minerals Tabs tablet Take 1 tablet by mouth in the morning.   tamsulosin 0.4 MG Caps capsule Commonly known as: FLOMAX Take 1 capsule (0.4 mg total) by mouth daily.         Signed: Ray Church, III 11/23/2022, 2:28 PM

## 2022-11-28 DIAGNOSIS — N201 Calculus of ureter: Secondary | ICD-10-CM | POA: Diagnosis not present

## 2022-12-12 DIAGNOSIS — N200 Calculus of kidney: Secondary | ICD-10-CM | POA: Diagnosis not present

## 2022-12-31 DIAGNOSIS — Z30011 Encounter for initial prescription of contraceptive pills: Secondary | ICD-10-CM | POA: Diagnosis not present

## 2022-12-31 DIAGNOSIS — Z30432 Encounter for removal of intrauterine contraceptive device: Secondary | ICD-10-CM | POA: Diagnosis not present

## 2023-01-11 DIAGNOSIS — N201 Calculus of ureter: Secondary | ICD-10-CM | POA: Diagnosis not present

## 2023-06-03 DIAGNOSIS — S61411A Laceration without foreign body of right hand, initial encounter: Secondary | ICD-10-CM | POA: Diagnosis not present

## 2023-06-03 DIAGNOSIS — W268XXA Contact with other sharp object(s), not elsewhere classified, initial encounter: Secondary | ICD-10-CM | POA: Diagnosis not present

## 2023-06-10 DIAGNOSIS — Z4802 Encounter for removal of sutures: Secondary | ICD-10-CM | POA: Diagnosis not present

## 2023-06-10 DIAGNOSIS — S61411D Laceration without foreign body of right hand, subsequent encounter: Secondary | ICD-10-CM | POA: Diagnosis not present

## 2023-10-30 DIAGNOSIS — M25562 Pain in left knee: Secondary | ICD-10-CM | POA: Diagnosis not present

## 2023-10-30 DIAGNOSIS — S8992XA Unspecified injury of left lower leg, initial encounter: Secondary | ICD-10-CM | POA: Diagnosis not present

## 2023-10-30 DIAGNOSIS — M7122 Synovial cyst of popliteal space [Baker], left knee: Secondary | ICD-10-CM | POA: Diagnosis not present
